# Patient Record
Sex: Male | Born: 1950 | Race: White | Hispanic: No | Marital: Married | Smoking: Never smoker
Health system: Southern US, Community
[De-identification: ages and names within clinical notes are randomized; demographics above are authoritative.]

## PROBLEM LIST (undated history)

## (undated) DIAGNOSIS — D649 Anemia, unspecified: Secondary | ICD-10-CM

## (undated) DIAGNOSIS — J189 Pneumonia, unspecified organism: Secondary | ICD-10-CM

## (undated) DIAGNOSIS — C9000 Multiple myeloma not having achieved remission: Secondary | ICD-10-CM

## (undated) DIAGNOSIS — Z8719 Personal history of other diseases of the digestive system: Secondary | ICD-10-CM

## (undated) HISTORY — PX: HERNIA REPAIR: SHX51

---

## 2009-02-17 ENCOUNTER — Ambulatory Visit (HOSPITAL_COMMUNITY): Admission: RE | Admit: 2009-02-17 | Discharge: 2009-02-17 | Payer: Self-pay | Admitting: Surgery

## 2011-01-23 NOTE — Op Note (Signed)
NAME:  Colin Calderon, Colin Calderon             ACCOUNT NO.:  [id number]   MEDICAL RECORD NO.:  [id number]          PATIENT TYPE:  AMB   LOCATION:  DAY                          FACILITY:  [facility]   PHYSICIAN:  Sandria Bales. Ezzard Standing, M.D.  DATE OF BIRTH:  18-Apr-1951   DATE OF PROCEDURE:  02/17/2009  DATE OF DISCHARGE:                               OPERATIVE REPORT   Date of Surgery - 17 February 2009   PREOPERATIVE DIAGNOSIS:  Left inguinal hernia.   POSTOPERATIVE DIAGNOSIS:  Indirect left inguinal hernia medium in size.   PROCEDURE:  Is a laparoscopic repair of left inguinal hernia.   SURGEON:  Dr. Ezzard Standing   FIRST ASSISTANT:  None.   ANESTHESIA:  Is general endotracheal.   BLOOD LOSS:  Is minimal.   INDICATIONS FOR PROCEDURE:  Colin Calderon is a 60 year old white male  patient of Dr. Lajean Manes who sees Dr. Griffith Citron from a GI  standpoint, has had increasing left inguinal hernia and comes for repair  of this hernia.   Discussed with him the options for open versus laparoscopic repair.  He  wanted to do laparoscopic repair.  I discussed potential risks which  include, but are not limited to, bleeding, infection, nerve injury and  recurrence of hernia.   OPERATIVE NOTE:  The patient placed in supine position.  A Foley  catheter in place.  His right arm was tucked and left arm was out to his  side.  He had PAS stockings in place and abdomen was prepped and  sterilely draped, given a gram of Ancef and timeout was held identifying  the patient and the procedure.  I had marked his left side  preoperatively.   I made an infraumbilical incision with sharp dissection carried down to  the rectus abdominis fascia.  I went to the anterior fascia, retracted  the rectus abdominal muscle anteriorly and then went behind the rectus  abdominal muscle got to the preperitoneal space and used the  preperitoneal balloon to dilate this preperitoneal space.   This was done for the visualization.  The balloon  was then removed.  The  12 mm Hassan trocar was then placed and I placed a 5 mm trocar in the  right lower quadrant, a 5 mm trocar in the left lower quadrant and  dissected out identifying the pubic tubercle, Cooper's ligament.  There  was really some weakness in the inguinal floor, but no obvious hernia.  I went down and dissected out laterally and I found the hernia sac which  was probably about 8-10 cm long.  I stripped this out of the cord  structures.  I then ligated the sac with a 0-0 PDS suture.   I then identified the cord structures which I skeletonized.  I used the  precut Atrium mesh and the C-QUR Lite mesh and placed this in the  preperitoneal space.  I tacked it with approximately 11 tacks, tacked it  medially to the pubic tubercle, inferiorly to Cooper's ligament,  superiorly to transverse fascia.  I avoided the area lateral to the  iliac vessels and inferior to the  ilioinguinal ligament and wrapped the  mesh around which I tacked back to Cooper's ligament.   The mesh lay flat.  I did take photos which I left in the chart of the  mesh.   He did have a little bleeder that was right off of Cooper's ligament and  I first tried to stop this with pressure and then I cauterized this and  that seemed to stop the bleeding.  I decompressed the preperitoneal  space, waited 3 minutes and relooked at it.  There is no further  bleeding.   I then removed my trocars.  The mesh again lay flat without any evidence  of any curling up on any edge.  I closed the anterior rectus fascia with  interrupted 0 Vicryl suture, I closed the skin with a 5-0 Vicryl suture,  painted the wounds with tincture Benzoin and steri-stripped them.   Sponge and needle count were correct at the end of the case.  The  patient tolerated the procedure and will be discharged home tonight.      Sandria Bales. Ezzard Standing, M.D.  Electronically Signed     DHN/MEDQ  D:  02/17/2009  T:  02/18/2009  Job:  [id number]   cc:    Griffith Citron, M.D.  Fax: [phone]   Oley Balm. Georgina Pillion, M.D.  Fax: [phone]

## 2012-01-17 ENCOUNTER — Telehealth: Payer: Self-pay | Admitting: Internal Medicine

## 2012-01-18 NOTE — Telephone Encounter (Signed)
Erroneous encounter. Have Not seen this patient

## 2014-02-23 ENCOUNTER — Ambulatory Visit (INDEPENDENT_AMBULATORY_CARE_PROVIDER_SITE_OTHER): Payer: BC Managed Care – PPO | Admitting: Sports Medicine

## 2014-02-23 ENCOUNTER — Encounter: Payer: Self-pay | Admitting: Sports Medicine

## 2014-02-23 VITALS — BP 106/67 | Ht 71.0 in | Wt 180.0 lb

## 2014-02-23 DIAGNOSIS — M25579 Pain in unspecified ankle and joints of unspecified foot: Secondary | ICD-10-CM

## 2014-02-23 DIAGNOSIS — M25571 Pain in right ankle and joints of right foot: Secondary | ICD-10-CM | POA: Insufficient documentation

## 2014-02-23 DIAGNOSIS — M25572 Pain in left ankle and joints of left foot: Principal | ICD-10-CM

## 2014-02-23 NOTE — Assessment & Plan Note (Addendum)
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 Lateral ankle capsule is getting pinched with sinus tarsi. Morning ankle stiffness a sign of effusion. - Provided patient with isnerts - Exercises prescribed (pigeon-toed heel raise, walk, and walk up steps). - Return in 1 month prn no improvement or worsening.

## 2014-02-23 NOTE — Patient Instructions (Signed)
For your ankle pain, this is likely due to malpositioning of the ankle where it is pronating and the lateral part is collapsing some. 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  - We are making inserts for your shoe to correct your arch.  - Do exercises:  ---Pigeon-toed heel raise on step. 3 sets of 15 ---Walk pigeon-toed across a room 10 times ---Walk up the steps 10 times pigeon-toed.  - Continue to bike.  Follow up in 1 month if you are not better.

## 2014-02-23 NOTE — Progress Notes (Signed)
Subjective:   CC: Patient presents today to establish care. Additionally, patient would like to discuss ankle pain.  HPI:   Ankle pain Bilateral, present for about 3 weeks. He had helped a friend lifting heavy objects about 4 weeks ago and felt like he injured his left hip and was walking oddly afterwards. Pain is noticeable but not severe. It is worse in morning when he stands after waking up. Denies stiffness, swelling, or instability. Symptoms better later in the day. He is active, walking and biking. Walking is okay, but he has the lateral ankle pain when he pushes off forefoot. He has had chronic foot pain and has never worn insoles. No h/o ankle issues.  Review of Systems - Per HPI.  PMH: No h/o ankle issues  SH: Walks and bikes for physical activity     Objective:  Physical Exam BP 106/67  Ht 5\' 11"  (1.803 m)  Wt 180 lb (81.647 kg)  BMI 25.12 kg/m2 GEN: NAD  EXTR:   Feet: No erythema, or swelling No tenderness  Normal flexibility and no laxity to anterior drawer sign. 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 male here to establish care and discuss bilateral ankle pain.    Plan:     Lateral ankle pain, bilateral Likely related to longitudinal arch breakdown causing ankle pronation and structural malposition with collapse of spring ligament but functioning posterior tibialis. Lateral ankle capsule is getting pinched with sinus tarsi. Morning ankle stiffness a sign of effusion. - Provided patient with isnerts - Exercises prescribed (pigeon-toed heel raise, walk,  and walk up steps). - Return in 1 month prn no improvement or worsening.    Hilton Sinclair, MD [place]

## 2014-03-08 ENCOUNTER — Emergency Department (HOSPITAL_COMMUNITY): Payer: BC Managed Care – PPO

## 2014-03-08 ENCOUNTER — Emergency Department (HOSPITAL_COMMUNITY)
Admission: EM | Admit: 2014-03-08 | Discharge: 2014-03-08 | Disposition: A | Discharge status: Home / Self Care | Payer: BC Managed Care – PPO | Attending: Emergency Medicine | Admitting: Emergency Medicine

## 2014-03-08 ENCOUNTER — Encounter (HOSPITAL_COMMUNITY): Payer: Self-pay | Admitting: Emergency Medicine

## 2014-03-08 DIAGNOSIS — Z792 Long term (current) use of antibiotics: Secondary | ICD-10-CM | POA: Insufficient documentation

## 2014-03-08 DIAGNOSIS — M545 Low back pain, unspecified: Secondary | ICD-10-CM | POA: Insufficient documentation

## 2014-03-08 DIAGNOSIS — R000 Tachycardia, unspecified: Secondary | ICD-10-CM | POA: Insufficient documentation

## 2014-03-08 DIAGNOSIS — R509 Fever, unspecified: Secondary | ICD-10-CM

## 2014-03-08 DIAGNOSIS — J159 Unspecified bacterial pneumonia: Secondary | ICD-10-CM | POA: Insufficient documentation

## 2014-03-08 DIAGNOSIS — R51 Headache: Secondary | ICD-10-CM | POA: Insufficient documentation

## 2014-03-08 DIAGNOSIS — J189 Pneumonia, unspecified organism: Secondary | ICD-10-CM

## 2014-03-08 LAB — BASIC METABOLIC PANEL
BUN: 17 mg/dL (ref 6–23)
CHLORIDE: 97 meq/L (ref 96–112)
CO2: 25 mEq/L (ref 19–32)
Calcium: 8.6 mg/dL (ref 8.4–10.5)
Creatinine, Ser: 1.21 mg/dL (ref 0.50–1.35)
GFR calc non Af Amer: 63 mL/min — ABNORMAL LOW (ref >90)
GFR, EST AFRICAN AMERICAN: 73 mL/min — AB (ref >90)
Glucose, Bld: 147 mg/dL — ABNORMAL HIGH (ref 70–99)
POTASSIUM: 4 meq/L (ref 3.7–5.3)
SODIUM: 135 meq/L — AB (ref 137–147)

## 2014-03-08 LAB — CBC WITH DIFFERENTIAL/PLATELET
BASOS ABS: 0 10*3/uL (ref 0.0–0.1)
BASOS PCT: 0 % (ref 0–1)
Eosinophils Absolute: 0 10*3/uL (ref 0.0–0.7)
Eosinophils Relative: 0 % (ref 0–5)
HCT: 36.4 % — ABNORMAL LOW (ref 39.0–52.0)
Hemoglobin: 12.9 g/dL — ABNORMAL LOW (ref 13.0–17.0)
LYMPHS PCT: 11 % — AB (ref 12–46)
Lymphs Abs: 0.6 10*3/uL — ABNORMAL LOW (ref 0.7–4.0)
MCH: 34.2 pg — ABNORMAL HIGH (ref 26.0–34.0)
MCHC: 35.4 g/dL (ref 30.0–36.0)
MCV: 96.6 fL (ref 78.0–100.0)
Monocytes Absolute: 0.1 10*3/uL (ref 0.1–1.0)
Monocytes Relative: 3 % (ref 3–12)
NEUTROS ABS: 4.7 10*3/uL (ref 1.7–7.7)
NEUTROS PCT: 86 % — AB (ref 43–77)
Platelets: 162 10*3/uL (ref 150–400)
RBC: 3.77 MIL/uL — ABNORMAL LOW (ref 4.22–5.81)
RDW: 12.8 % (ref 11.5–15.5)
WBC: 5.5 10*3/uL (ref 4.0–10.5)

## 2014-03-08 MED ORDER — SODIUM CHLORIDE 0.9 % IV BOLUS (SEPSIS)
1000.0000 mL | Freq: Once | INTRAVENOUS | Status: AC
  Administered 2014-03-08: 1000 mL via INTRAVENOUS

## 2014-03-08 MED ORDER — AZITHROMYCIN 250 MG PO TABS: 250.0000 mg | ORAL_TABLET | Freq: Every day | ORAL | Status: DC

## 2014-03-08 MED ORDER — IBUPROFEN 200 MG PO TABS
600.0000 mg | ORAL_TABLET | Freq: Once | ORAL | Status: AC
  Administered 2014-03-08: 600 mg via ORAL
  Filled 2014-03-08: qty 3

## 2014-03-08 NOTE — Discharge Instructions (Signed)
Fever, Adult A fever is a higher than normal body temperature. In an adult, an oral temperature around 98.6 F (37 C) is considered normal. A temperature of 100.4 F (38 C) or higher is generally considered a fever. Mild or moderate fevers generally have no long-term effects and often do not require treatment. Extreme fever (greater than or equal to 106 F or 41.1 C) can cause seizures. The sweating that may occur with repeated or prolonged fever may cause dehydration. Elderly people can develop confusion during a fever. A measured temperature can vary with:  Age.  Time of day.  Method of measurement (mouth, underarm, rectal, or ear). The fever is confirmed by taking a temperature with a thermometer. Temperatures can be taken different ways. Some methods are accurate and some are not.  An oral temperature is used most commonly. Electronic thermometers are fast and accurate.  An ear temperature will only be accurate if the thermometer is positioned as recommended by the manufacturer.  A rectal temperature is accurate and done for those adults who have a condition where an oral temperature cannot be taken.  An underarm (axillary) temperature is not accurate and not recommended. Fever is a symptom, not a disease.  CAUSES   Infections commonly cause fever.  Some noninfectious causes for fever include:  Some arthritis conditions.  Some thyroid or adrenal gland conditions.  Some immune system conditions.  Some types of cancer.  A medicine reaction.  High doses of certain street drugs such as methamphetamine.  Dehydration.  Exposure to high outside or room temperatures.  Occasionally, the source of a fever cannot be determined. This is sometimes called a "fever of unknown origin" (FUO).  Some situations may lead to a temporary rise in body temperature that may go away on its own. Examples are:  Childbirth.  Surgery.  Intense exercise. HOME CARE INSTRUCTIONS   Take  appropriate medicines for fever. Follow dosing instructions carefully. If you use acetaminophen to reduce the fever, be careful to avoid taking other medicines that also contain acetaminophen. Do not take aspirin for a fever if you are younger than age 60. There is an association with Reye's syndrome. Reye's syndrome is a rare but potentially deadly disease.  If an infection is present and antibiotics have been prescribed, take them as directed. Finish them even if you start to feel better.  Rest as needed.  Maintain an adequate fluid intake. To prevent dehydration during an illness with prolonged or recurrent fever, you may need to drink extra fluid.Drink enough fluids to keep your urine clear or pale yellow.  Sponging or bathing with room temperature water may help reduce body temperature. Do not use ice water or alcohol sponge baths.  Dress comfortably, but do not over-bundle. SEEK MEDICAL CARE IF:   You are unable to keep fluids down.  You develop vomiting or diarrhea.  You are not feeling at least partly better after 3 days.  You develop new symptoms or problems. SEEK IMMEDIATE MEDICAL CARE IF:   You have shortness of breath or trouble breathing.  You develop excessive weakness.  You are dizzy or you faint.  You are extremely thirsty or you are making little or no urine.  You develop new pain that was not there before (such as in the head, neck, chest, back, or abdomen).  You have persistant vomiting and diarrhea for more than 1 to 2 days.  You develop a stiff neck or your eyes become sensitive to light.  You develop a  skin rash.  You have a fever or persistent symptoms for more than 2 to 3 days.  You have a fever and your symptoms suddenly get worse. MAKE SURE YOU:   Understand these instructions.  Will watch your condition.  Will get help right away if you are not doing well or get worse. Document Released: 02/20/2001 Document Revised: 11/19/2011 Document  Reviewed: 06/28/2011 [facility] Patient Information 2015 [place], [place]. This information is not intended to replace advice given to you by your health care provider. Make sure you discuss any questions you have with your health care provider.  Pneumonia, Adult Pneumonia is an infection of the lungs.  CAUSES Pneumonia may be caused by bacteria or a virus. Usually, these infections are caused by breathing infectious particles into the lungs (respiratory tract). SYMPTOMS   Cough.  Fever.  Chest pain.  Increased rate of breathing.  Wheezing.  Mucus production. DIAGNOSIS  If you have the common symptoms of pneumonia, your caregiver will typically confirm the diagnosis with a chest X-ray. The X-ray will show an abnormality in the lung (pulmonary infiltrate) if you have pneumonia. Other tests of your blood, urine, or sputum may be done to find the specific cause of your pneumonia. Your caregiver may also do tests (blood gases or pulse oximetry) to see how well your lungs are working. TREATMENT  Some forms of pneumonia may be spread to other people when you cough or sneeze. You may be asked to wear a mask before and during your exam. Pneumonia that is caused by bacteria is treated with antibiotic medicine. Pneumonia that is caused by the influenza virus may be treated with an antiviral medicine. Most other viral infections must run their course. These infections will not respond to antibiotics.  PREVENTION A pneumococcal shot (vaccine) is available to prevent a common bacterial cause of pneumonia. This is usually suggested for:  People over 58 years old.  Patients on chemotherapy.  People with chronic lung problems, such as bronchitis or emphysema.  People with immune system problems. If you are over 65 or have a high risk condition, you may receive the pneumococcal vaccine if you have not received it before. In some countries, a routine influenza vaccine is also recommended. This vaccine  can help prevent some cases of pneumonia.You may be offered the influenza vaccine as part of your care. If you smoke, it is time to quit. You may receive instructions on how to stop smoking. Your caregiver can provide medicines and counseling to help you quit. HOME CARE INSTRUCTIONS   Cough suppressants may be used if you are losing too much rest. However, coughing protects you by clearing your lungs. You should avoid using cough suppressants if you can.  Your caregiver may have prescribed medicine if he or she thinks your pneumonia is caused by a bacteria or influenza. Finish your medicine even if you start to feel better.  Your caregiver may also prescribe an expectorant. This loosens the mucus to be coughed up.  Only take over-the-counter or prescription medicines for pain, discomfort, or fever as directed by your caregiver.  Do not smoke. Smoking is a common cause of bronchitis and can contribute to pneumonia. If you are a smoker and continue to smoke, your cough may last several weeks after your pneumonia has cleared.  A cold steam vaporizer or humidifier in your room or home may help loosen mucus.  Coughing is often worse at night. Sleeping in a semi-upright position in a recliner or using a couple  pillows under your head will help with this.  Get rest as you feel it is needed. Your body will usually let you know when you need to rest. SEEK IMMEDIATE MEDICAL CARE IF:   Your illness becomes worse. This is especially true if you are elderly or weakened from any other disease.  You cannot control your cough with suppressants and are losing sleep.  You begin coughing up blood.  You develop pain which is getting worse or is uncontrolled with medicines.  You have a fever.  Any of the symptoms which initially brought you in for treatment are getting worse rather than better.  You develop shortness of breath or chest pain. MAKE SURE YOU:   Understand these instructions.  Will  watch your condition.  Will get help right away if you are not doing well or get worse. Document Released: 08/27/2005 Document Revised: 11/19/2011 Document Reviewed: 11/16/2010 [facility] Patient Information 2015 [place], [place]. This information is not intended to replace advice given to you by your health care provider. Make sure you discuss any questions you have with your health care provider.

## 2014-03-08 NOTE — ED Provider Notes (Signed)
CSN: [id number]     Arrival date & time 03/08/14  1234 History   First MD Initiated Contact with Patient 03/08/14 1556     Chief Complaint  Patient presents with  . Fever  . Fatigue     (Consider location/radiation/quality/duration/timing/severity/associated sxs/prior Treatment) Patient is a 63 y.o. male presenting with general illness. The history is provided by the patient and the spouse.  Illness Severity:  Moderate Onset quality:  Gradual Timing:  Constant Progression:  Unchanged Chronicity:  New Associated symptoms: fever and rash (mild redness to chest yesterday. resolved)   Associated symptoms: no abdominal pain, no chest pain, no congestion, no cough, no diarrhea, no headaches, no nausea, no rhinorrhea, no shortness of breath, no sore throat and no vomiting     63 yo male pw Fever. 3 days. Intermittently improved from ~103 to 101 with tylenol/motrin. Minimal occasional frontal headache similar to what he has had with prior fevers. No ST, ear pain, congestion, rhinorrhea, sob, cough, abd pain, n/v. No neck pain, except for mild neck soreness had earlier after sleeping outside in chair. No neck stiffness.   History reviewed. No pertinent past medical history. Past Surgical History  Procedure Laterality Date  . Hernia repair     History reviewed. No pertinent family history. History  Substance Use Topics  . Smoking status: Never Smoker   . Smokeless tobacco: Not on file  . Alcohol Use: Yes    Review of Systems  Constitutional: Positive for fever and chills.  HENT: Negative for congestion, rhinorrhea, sore throat, trouble swallowing and voice change.   Eyes: Negative for pain and visual disturbance.  Respiratory: Negative for cough and shortness of breath.   Cardiovascular: Negative for chest pain and leg swelling.  Gastrointestinal: Negative for nausea, vomiting, abdominal pain and diarrhea.  Genitourinary: Negative for flank pain and difficulty urinating.   Musculoskeletal: Positive for back pain (minimal generalized low back pain credited to laying in bed. no midline back pain. ).  Skin: Positive for rash (mild redness to chest yesterday. resolved). Negative for color change.  Neurological: Negative for dizziness and headaches.  All other systems reviewed and are negative.     Allergies  Review of patient's allergies indicates no known allergies.  Home Medications   Prior to Admission medications   Medication Sig Start Date End Date Taking? Authorizing Provider  azithromycin (ZITHROMAX) 250 MG tablet Take 1 tablet (250 mg total) by mouth daily. Take first 2 tablets together, then 1 every day until finished. 03/08/14   Bonnita Hollow, MD   BP 112/68  Pulse 106  Temp(Src) 103.1 F (39.5 C) (Oral)  Resp 22  SpO2 95% Physical Exam  Nursing note and vitals reviewed. Constitutional: He is oriented to person, place, and time. He appears well-developed and well-nourished. No distress.  Sitting up in bed. NAD. Speaks in full sentences.  HENT:  Head: Normocephalic and atraumatic.  Eyes: Conjunctivae are normal. Right eye exhibits no discharge. Left eye exhibits no discharge.  Neck: No tracheal deviation present.  Cardiovascular: Regular rhythm, normal heart sounds and intact distal pulses.   Tachycardic to low 100's  Pulmonary/Chest: Effort normal and breath sounds normal. No stridor. No respiratory distress. He has no wheezes. He has no rales.  Abdominal: Soft. He exhibits no distension. There is no tenderness. There is no guarding.  Musculoskeletal: He exhibits no edema and no tenderness.  Neurological: He is alert and oriented to person, place, and time.  Skin: Skin is warm and dry.  Psychiatric:  He has a normal mood and affect. His behavior is normal.    ED Course  Procedures (including critical care time) Labs Review Labs Reviewed  CBC WITH DIFFERENTIAL - Abnormal; Notable for the following:    RBC 3.77 (*)    Hemoglobin 12.9  (*)    HCT 36.4 (*)    MCH 34.2 (*)    Neutrophils Relative % 86 (*)    Lymphocytes Relative 11 (*)    Lymphs Abs 0.6 (*)    All other components within normal limits  BASIC METABOLIC PANEL - Abnormal; Notable for the following:    Sodium 135 (*)    Glucose, Bld 147 (*)    GFR calc non Af Amer 63 (*)    GFR calc Af Amer 73 (*)    All other components within normal limits  CULTURE, BLOOD (ROUTINE X 2)  CULTURE, BLOOD (ROUTINE X 2)    Imaging Review Dg Chest 2 View  03/08/2014   CLINICAL DATA:  High fever.  EXAM: CHEST  2 VIEW  COMPARISON:  None  FINDINGS: Normal heart size. No pleural effusion or edema. The lungs are hyperinflated and there is flattening of the diaphragms. Right lung is clear. Retrocardiac opacity in the left base is noted which is suspicious for pneumonia.  IMPRESSION: 1. Suspect left base pneumonia. 2. Increase lung volumes suggestive of emphysema   Electronically Signed   By: Kerby Moors M.D.   On: 03/08/2014 18:25     EKG Interpretation None      MDM   Final diagnoses:  Fever, unspecified fever cause  CAP (community acquired pneumonia)    Fever. Essentially asymptomatic. Well appearing on exam.  Labs reassuring. CXR concerning for pna. Treat with z-pack.  Blood cultures pending. Will call with results.  Feels improved after IVF.   No tick exposure.  Without meningeal findings. Do not suspect meningitis. UA without evidence of UTI at outside facility.  No wounds.   Patient discharged home. Return precautions given. To follow up with pcp. Patient and spouse in agreement with plan.     Bonnita Hollow, MD 03/09/14 [phone]

## 2014-03-08 NOTE — ED Notes (Signed)
Pt reports having high fever 102-103 since Saturday, having generalized fatigue, bodyache and headache and sent here for dehydration. Denies abd pan, n/v/d or sore throat. No travel or tick bite. Last took tylenol at 0600 and 1130, temp is 103 at triage, mask on pt.

## 2014-03-09 NOTE — ED Provider Notes (Signed)
I saw and evaluated the patient, reviewed the resident's note and I agree with the findings and plan.   EKG Interpretation None      Pt is a healthy male with no medical hx comes in with cc of fever x 3 days. Pt has chills. Denies headache, neck pain, back pain, chest pan. Cough, uti like sxm joint pain. Pt has had a rash on the torso. Exam is benign. Will get cultures, basic labs, discharge if neg findings. No antibiotics for now.    Varney Biles, MD 03/09/14 [phone]

## 2014-03-14 LAB — CULTURE, BLOOD (ROUTINE X 2)
CULTURE: NO GROWTH
Culture: NO GROWTH

## 2017-08-15 ENCOUNTER — Ambulatory Visit: Payer: PPO | Admitting: Sports Medicine

## 2017-08-15 ENCOUNTER — Encounter: Payer: Self-pay | Admitting: Sports Medicine

## 2017-08-15 DIAGNOSIS — M25572 Pain in left ankle and joints of left foot: Secondary | ICD-10-CM | POA: Diagnosis not present

## 2017-08-15 NOTE — Progress Notes (Signed)
Subjective:    Patient ID: Colin Calderon, male    DOB: 05/08/1951, 66 y.o.   MRN: [id number]  Colin Calderon presents today complaining of acute L ankle pain. He was previously seen in 2015 with Dr. Oneida Alar for bilateral ankle pain and treated with green inserts with scaffold padding and that resolved his pain. He is an avid walker and walks 5 miles every morning. However, around 6 months ago he was lifting a heavy suitcase and injured his left groin. He had a hernia surgery about 10 years ago and knows that he does not have a hernia now. The pain in his groin has persisted and is slowly getting better. However, as a result he thinks he may be changing his gait and that is what is causing his left ankle/foot pain. He denies any foot trauma or falls. He has not noticed any foot or ankle swelling. The pain is located on the top of the midfoot medially and up the anterior ankle, no radiation. It feels like a "pressure like pain." There is no pain at rest but mostly with walking and moving. He has not worn his insoles for over a year as he got new shoes and they don't fit in the shoes anymore.      Review of Systems  Constitutional: Negative.  Negative for fever.  HENT: Negative.   Eyes: Negative.   Respiratory: Negative.   Cardiovascular: Negative.   Gastrointestinal: Negative.   Endocrine: Negative.   Genitourinary: Negative.   Musculoskeletal: Positive for arthralgias (left ankle/foot). Negative for gait problem and joint swelling.  Skin: Negative.   Allergic/Immunologic: Negative.   Neurological: Negative.   Hematological: Negative.   Psychiatric/Behavioral: Negative.        Objective:   Physical Exam  Constitutional: He is oriented to person, place, and time. He appears well-developed and well-nourished. No distress.  HENT:  Head: Normocephalic and atraumatic.  Eyes: Conjunctivae and EOM are normal. Pupils are equal, round, and reactive to light.  Neck: Normal range of motion.  Neck supple. No tracheal deviation present.  Pulmonary/Chest: Effort normal. No stridor.  Neurological: He is alert and oriented to person, place, and time.  Skin: Skin is warm and dry. He is not diaphoretic.  Psychiatric: He has a normal mood and affect. His behavior is normal.   MSK:   Feet/Ankles: No swelling, minor bunions noted bilaterally L>R, second ray dominant. Neutral arches. No calluses or sores on the plantar surface of the L foot. L Tenderness to palpation over the midfoot Extensor hallicus longus tendon. L Ankle has full ROM in all directions. L Big toe has full ROM in all directions. L Plantar/Dorsiflexion 5/5, Big toe dorsiflexion and plantarflexion stregth 5/5 without pain. L Talar tilt/Anterior drawer test negative. Arches collapse with walking. Neutral gait noted with minimal pronation. B/L feet warm, perfused distally and sensation to light touch intact. Left Hip: No tenderness to palpation over the Iliac crest. Hip has full ROM in all directions without pain. Hip adduction/abduction/flexion 5/5 on the Left. Negative log roll test for pain on the Left.       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 We have simply replaced those for him today. I also gave him an arch strap to wear when walking. He may continue with activity as tolerated. I did discuss the possibility of custom orthotics at some  point down the road. We will set up a follow-up appointment in 4 weeks but the patient may cancel the appointment if his symptoms improve.

## 2017-08-21 DIAGNOSIS — Z23 Encounter for immunization: Secondary | ICD-10-CM | POA: Diagnosis not present

## 2017-08-21 DIAGNOSIS — D485 Neoplasm of uncertain behavior of skin: Secondary | ICD-10-CM | POA: Diagnosis not present

## 2017-08-22 ENCOUNTER — Other Ambulatory Visit: Payer: Self-pay | Admitting: Dermatology

## 2017-08-22 DIAGNOSIS — R2232 Localized swelling, mass and lump, left upper limb: Secondary | ICD-10-CM

## 2017-09-05 ENCOUNTER — Other Ambulatory Visit: Payer: Self-pay | Admitting: Dermatology

## 2017-09-05 DIAGNOSIS — R2232 Localized swelling, mass and lump, left upper limb: Secondary | ICD-10-CM

## 2017-09-13 ENCOUNTER — Other Ambulatory Visit: Payer: Self-pay | Admitting: Dermatology

## 2017-09-13 DIAGNOSIS — R2232 Localized swelling, mass and lump, left upper limb: Secondary | ICD-10-CM

## 2017-09-17 ENCOUNTER — Ambulatory Visit
Admission: RE | Admit: 2017-09-17 | Discharge: 2017-09-17 | Disposition: A | Discharge status: Home / Self Care | Payer: PPO | Source: Ambulatory Visit | Attending: Dermatology | Admitting: Dermatology

## 2017-09-17 DIAGNOSIS — R590 Localized enlarged lymph nodes: Secondary | ICD-10-CM | POA: Diagnosis not present

## 2017-09-17 DIAGNOSIS — R2232 Localized swelling, mass and lump, left upper limb: Secondary | ICD-10-CM

## 2017-09-30 ENCOUNTER — Ambulatory Visit: Payer: PPO | Admitting: Sports Medicine

## 2017-10-03 DIAGNOSIS — H5213 Myopia, bilateral: Secondary | ICD-10-CM | POA: Diagnosis not present

## 2017-10-03 DIAGNOSIS — H52223 Regular astigmatism, bilateral: Secondary | ICD-10-CM | POA: Diagnosis not present

## 2017-10-03 DIAGNOSIS — H524 Presbyopia: Secondary | ICD-10-CM | POA: Diagnosis not present

## 2017-10-16 DIAGNOSIS — D485 Neoplasm of uncertain behavior of skin: Secondary | ICD-10-CM | POA: Diagnosis not present

## 2017-10-16 DIAGNOSIS — D235 Other benign neoplasm of skin of trunk: Secondary | ICD-10-CM | POA: Diagnosis not present

## 2017-11-05 DIAGNOSIS — H9193 Unspecified hearing loss, bilateral: Secondary | ICD-10-CM | POA: Diagnosis not present

## 2017-11-05 DIAGNOSIS — H6120 Impacted cerumen, unspecified ear: Secondary | ICD-10-CM | POA: Diagnosis not present

## 2018-01-31 DIAGNOSIS — Z8349 Family history of other endocrine, nutritional and metabolic diseases: Secondary | ICD-10-CM | POA: Diagnosis not present

## 2018-01-31 DIAGNOSIS — R7301 Impaired fasting glucose: Secondary | ICD-10-CM | POA: Diagnosis not present

## 2018-01-31 DIAGNOSIS — D7589 Other specified diseases of blood and blood-forming organs: Secondary | ICD-10-CM | POA: Diagnosis not present

## 2018-01-31 DIAGNOSIS — E538 Deficiency of other specified B group vitamins: Secondary | ICD-10-CM | POA: Diagnosis not present

## 2018-01-31 DIAGNOSIS — N400 Enlarged prostate without lower urinary tract symptoms: Secondary | ICD-10-CM | POA: Diagnosis not present

## 2018-01-31 DIAGNOSIS — D649 Anemia, unspecified: Secondary | ICD-10-CM | POA: Diagnosis not present

## 2018-02-07 DIAGNOSIS — R0989 Other specified symptoms and signs involving the circulatory and respiratory systems: Secondary | ICD-10-CM | POA: Diagnosis not present

## 2018-02-07 DIAGNOSIS — Z1211 Encounter for screening for malignant neoplasm of colon: Secondary | ICD-10-CM | POA: Diagnosis not present

## 2018-02-07 DIAGNOSIS — Z0000 Encounter for general adult medical examination without abnormal findings: Secondary | ICD-10-CM | POA: Diagnosis not present

## 2018-02-07 DIAGNOSIS — D649 Anemia, unspecified: Secondary | ICD-10-CM | POA: Diagnosis not present

## 2018-02-07 DIAGNOSIS — Z1389 Encounter for screening for other disorder: Secondary | ICD-10-CM | POA: Diagnosis not present

## 2018-02-07 DIAGNOSIS — Z86010 Personal history of colonic polyps: Secondary | ICD-10-CM | POA: Diagnosis not present

## 2018-02-12 ENCOUNTER — Other Ambulatory Visit: Payer: Self-pay | Admitting: Family Medicine

## 2018-02-12 DIAGNOSIS — R0989 Other specified symptoms and signs involving the circulatory and respiratory systems: Secondary | ICD-10-CM

## 2018-02-17 DIAGNOSIS — R7989 Other specified abnormal findings of blood chemistry: Secondary | ICD-10-CM | POA: Diagnosis not present

## 2018-02-17 DIAGNOSIS — J069 Acute upper respiratory infection, unspecified: Secondary | ICD-10-CM | POA: Diagnosis not present

## 2018-02-18 ENCOUNTER — Ambulatory Visit
Admission: RE | Admit: 2018-02-18 | Discharge: 2018-02-18 | Disposition: A | Discharge status: Home / Self Care | Payer: PPO | Source: Ambulatory Visit | Attending: Family Medicine | Admitting: Family Medicine

## 2018-02-18 DIAGNOSIS — R0989 Other specified symptoms and signs involving the circulatory and respiratory systems: Secondary | ICD-10-CM

## 2018-02-18 DIAGNOSIS — I6523 Occlusion and stenosis of bilateral carotid arteries: Secondary | ICD-10-CM | POA: Diagnosis not present

## 2018-06-03 DIAGNOSIS — Z23 Encounter for immunization: Secondary | ICD-10-CM | POA: Diagnosis not present

## 2018-07-17 DIAGNOSIS — H0012 Chalazion right lower eyelid: Secondary | ICD-10-CM | POA: Diagnosis not present

## 2018-10-09 DIAGNOSIS — I739 Peripheral vascular disease, unspecified: Secondary | ICD-10-CM | POA: Diagnosis not present

## 2018-10-09 DIAGNOSIS — J069 Acute upper respiratory infection, unspecified: Secondary | ICD-10-CM | POA: Diagnosis not present

## 2018-10-09 DIAGNOSIS — Z23 Encounter for immunization: Secondary | ICD-10-CM | POA: Diagnosis not present

## 2019-02-10 DIAGNOSIS — Z8349 Family history of other endocrine, nutritional and metabolic diseases: Secondary | ICD-10-CM | POA: Diagnosis not present

## 2019-02-10 DIAGNOSIS — R7301 Impaired fasting glucose: Secondary | ICD-10-CM | POA: Diagnosis not present

## 2019-02-10 DIAGNOSIS — Z0000 Encounter for general adult medical examination without abnormal findings: Secondary | ICD-10-CM | POA: Diagnosis not present

## 2019-02-10 DIAGNOSIS — E538 Deficiency of other specified B group vitamins: Secondary | ICD-10-CM | POA: Diagnosis not present

## 2019-02-10 DIAGNOSIS — N400 Enlarged prostate without lower urinary tract symptoms: Secondary | ICD-10-CM | POA: Diagnosis not present

## 2019-02-10 DIAGNOSIS — D7589 Other specified diseases of blood and blood-forming organs: Secondary | ICD-10-CM | POA: Diagnosis not present

## 2019-02-12 IMAGING — US US AXILLARY LEFT
1 series · 13 of 23 positions shown · non-contrast
Comparison: None.

CLINICAL DATA: 66-year-old with enlarging palpable area in the left
axilla for 5 years.

EXAM:
ULTRASOUND LEFT UPPER EXTREMITY LIMITED
TECHNIQUE: Ultrasound examination of the upper extremity soft tissues was
performed in the area of clinical concern, concentrating on the left
axilla. This was not part of a dedicated mammographic evaluation.

[Series 1: us axillary left · 0.05mm/px · 23 acquisitions, 13 frames shown]
[im 1/23]
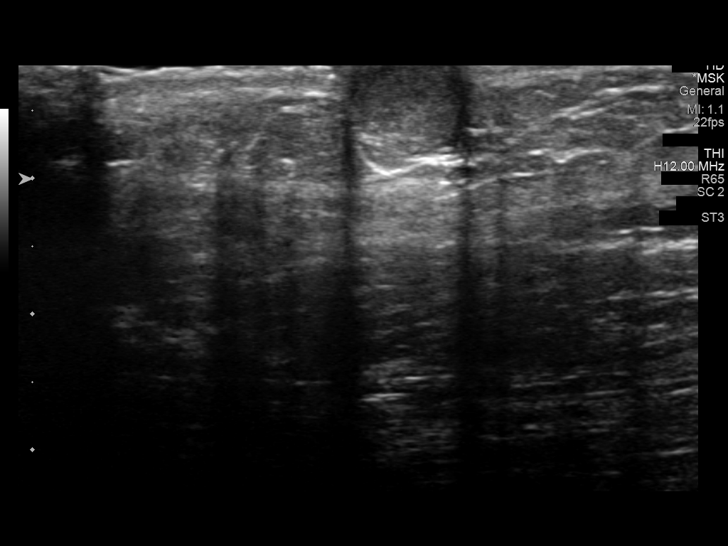
[im 3/23]
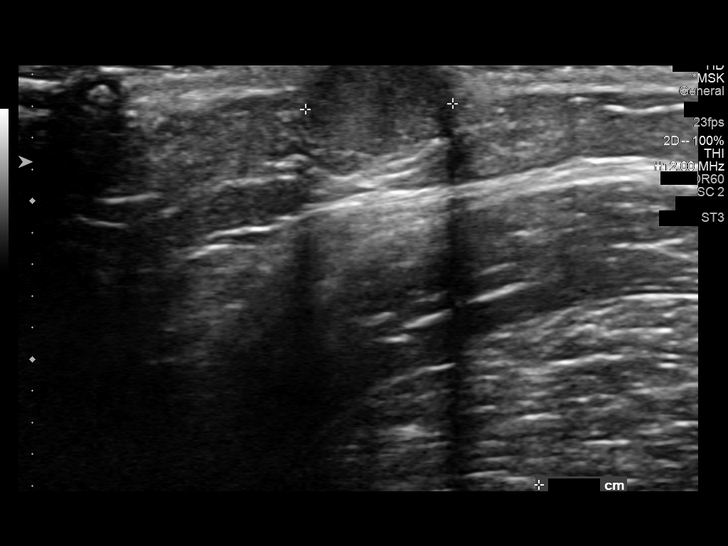
[im 5/23]
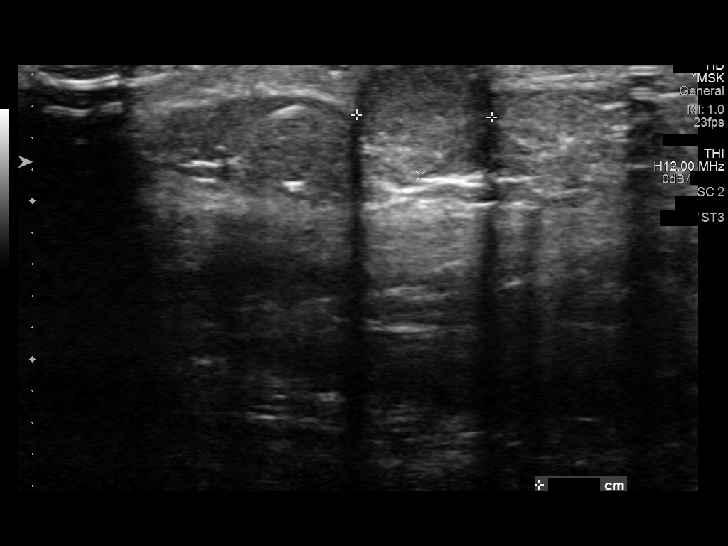
[im 7/23]
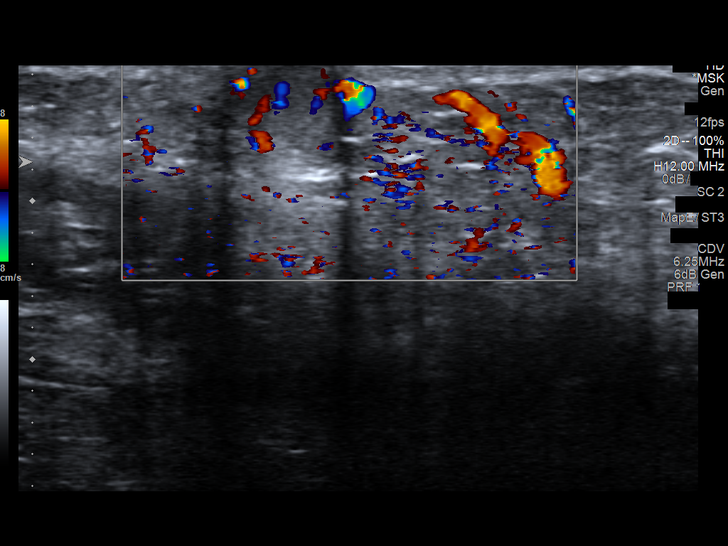
[im 8/23]
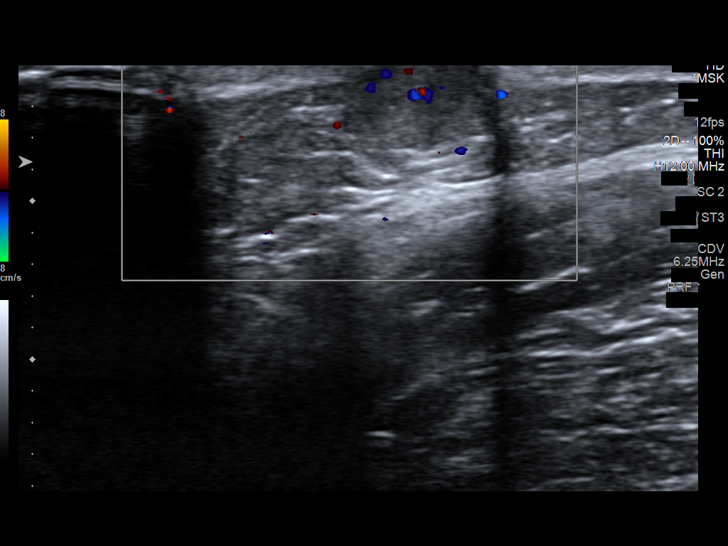
[im 10/23]
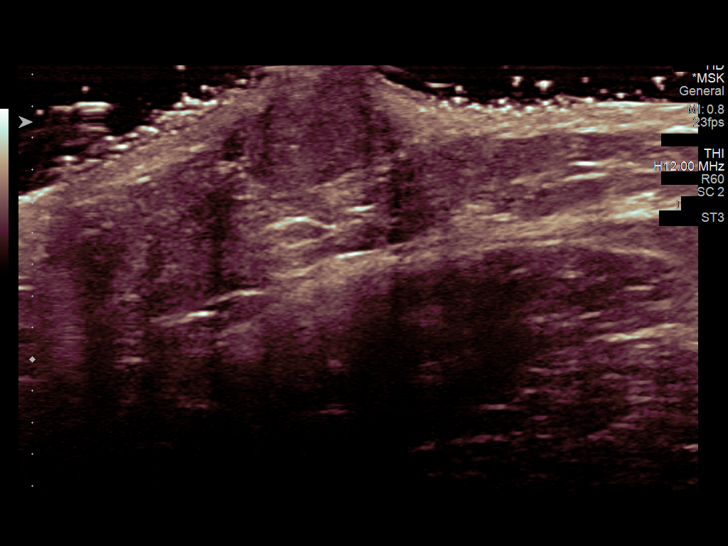
[im 12/23]
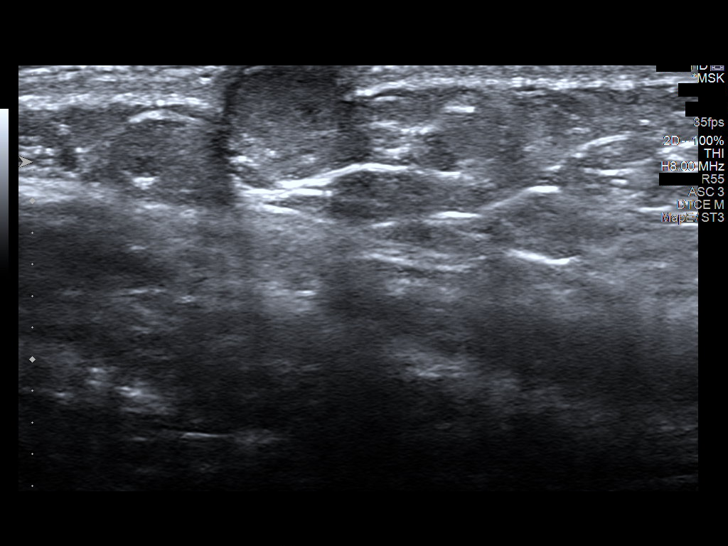
[im 14/23]
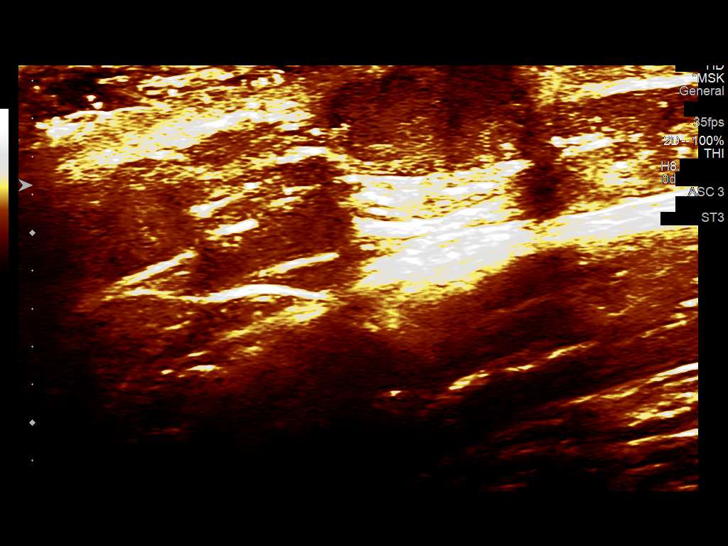
[im 16/23]
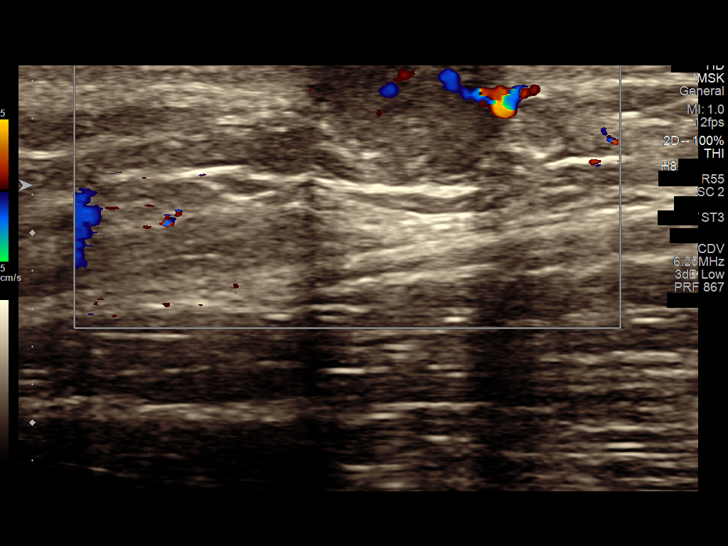
[im 17/23]
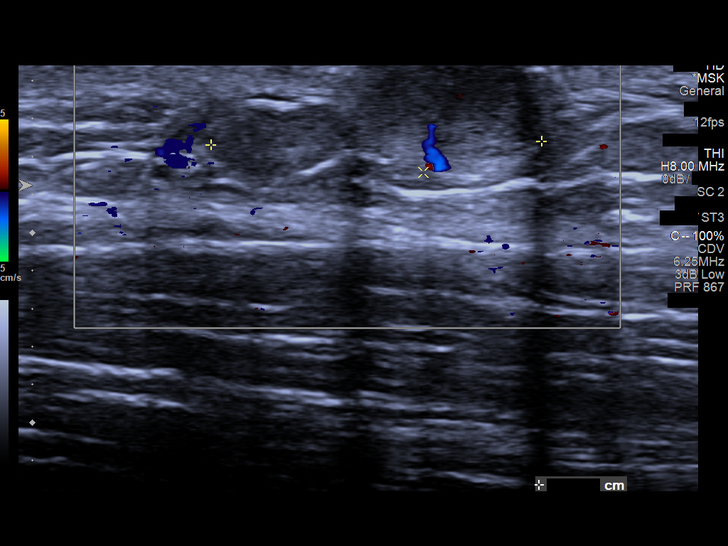
[im 19/23]
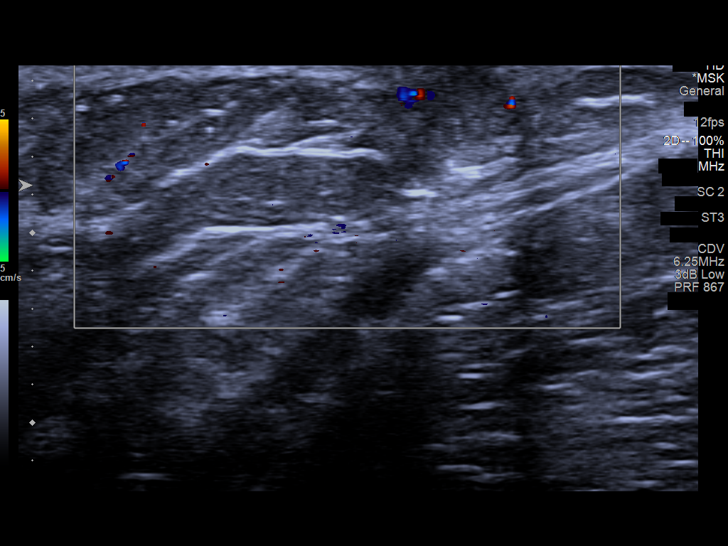
[im 21/23]
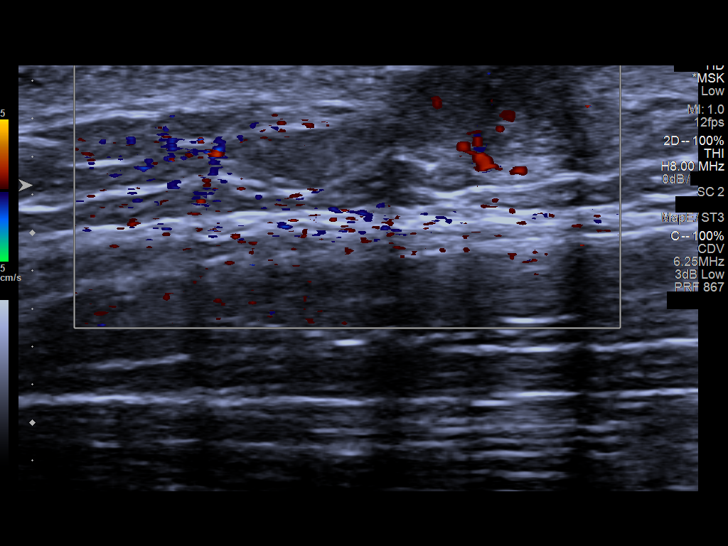
[im 23/23]
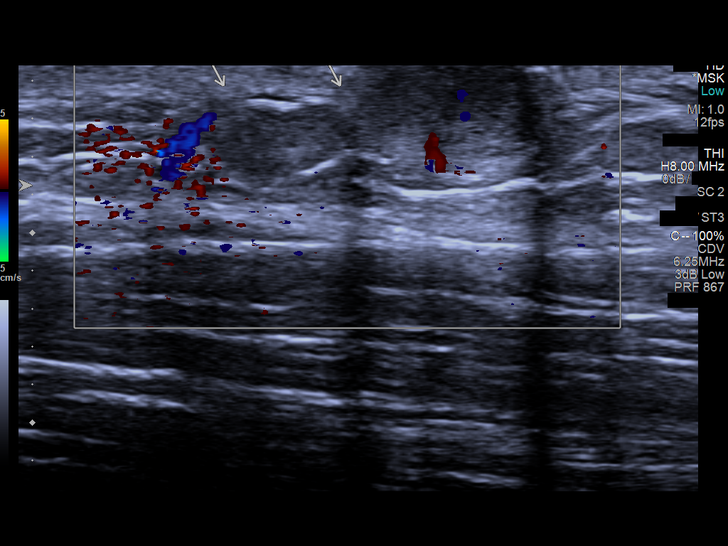

[13 of 23 positions shown; findings below may reference images not displayed]

FINDINGS: Interpretation of this examination was delayed as it was not made
available for dictation in PACS until 09/20/2017.

Corresponding with the patient's palpable concern in the left axilla
is a superficial, well-circumscribed lesion with internal blood
flow. This is lobulated and measures approximately 9 x 8 x 9 mm.
There may be a tail-like excrescence extending peripherally from
this lesion, increasing the overall dimension to 17 mm. This lesion
demonstrates low level internal echoes and acoustic enhancement. It
appears to deforms the overlying skin surface.
IMPRESSION: 1. Nonspecific left axillary lesion demonstrates internal blood flow
consistent with a solid lesion. Clinical history and ultrasound
appearance support a benign etiology.
2. Clinical follow up and correlation with breast physical
examination recommended. If enlarging, dedicated mammographic
evaluation recommended.

## 2019-02-13 DIAGNOSIS — R7301 Impaired fasting glucose: Secondary | ICD-10-CM | POA: Diagnosis not present

## 2019-02-13 DIAGNOSIS — N400 Enlarged prostate without lower urinary tract symptoms: Secondary | ICD-10-CM | POA: Diagnosis not present

## 2019-02-13 DIAGNOSIS — D7589 Other specified diseases of blood and blood-forming organs: Secondary | ICD-10-CM | POA: Diagnosis not present

## 2019-06-23 DIAGNOSIS — Z23 Encounter for immunization: Secondary | ICD-10-CM | POA: Diagnosis not present

## 2019-07-30 DIAGNOSIS — M545 Low back pain: Secondary | ICD-10-CM | POA: Diagnosis not present

## 2019-07-30 DIAGNOSIS — Z03818 Encounter for observation for suspected exposure to other biological agents ruled out: Secondary | ICD-10-CM | POA: Diagnosis not present

## 2019-10-10 ENCOUNTER — Ambulatory Visit: Payer: PPO

## 2019-10-15 ENCOUNTER — Ambulatory Visit: Payer: PPO

## 2019-10-16 DIAGNOSIS — M542 Cervicalgia: Secondary | ICD-10-CM | POA: Diagnosis not present

## 2019-10-16 DIAGNOSIS — M5432 Sciatica, left side: Secondary | ICD-10-CM | POA: Diagnosis not present

## 2019-10-16 DIAGNOSIS — M25512 Pain in left shoulder: Secondary | ICD-10-CM | POA: Diagnosis not present

## 2019-10-18 ENCOUNTER — Ambulatory Visit: Payer: PPO

## 2019-11-03 ENCOUNTER — Ambulatory Visit: Payer: PPO

## 2020-02-22 DIAGNOSIS — Z98818 Other dental procedure status: Secondary | ICD-10-CM | POA: Diagnosis not present

## 2020-03-03 DIAGNOSIS — H52203 Unspecified astigmatism, bilateral: Secondary | ICD-10-CM | POA: Diagnosis not present

## 2020-03-03 DIAGNOSIS — H25041 Posterior subcapsular polar age-related cataract, right eye: Secondary | ICD-10-CM | POA: Diagnosis not present

## 2020-03-03 DIAGNOSIS — H5213 Myopia, bilateral: Secondary | ICD-10-CM | POA: Diagnosis not present

## 2020-04-04 DIAGNOSIS — Z8349 Family history of other endocrine, nutritional and metabolic diseases: Secondary | ICD-10-CM | POA: Diagnosis not present

## 2020-04-04 DIAGNOSIS — E538 Deficiency of other specified B group vitamins: Secondary | ICD-10-CM | POA: Diagnosis not present

## 2020-04-04 DIAGNOSIS — Z125 Encounter for screening for malignant neoplasm of prostate: Secondary | ICD-10-CM | POA: Diagnosis not present

## 2020-04-04 DIAGNOSIS — Z136 Encounter for screening for cardiovascular disorders: Secondary | ICD-10-CM | POA: Diagnosis not present

## 2020-04-04 DIAGNOSIS — R7301 Impaired fasting glucose: Secondary | ICD-10-CM | POA: Diagnosis not present

## 2020-04-07 DIAGNOSIS — R0989 Other specified symptoms and signs involving the circulatory and respiratory systems: Secondary | ICD-10-CM | POA: Diagnosis not present

## 2020-04-07 DIAGNOSIS — E538 Deficiency of other specified B group vitamins: Secondary | ICD-10-CM | POA: Diagnosis not present

## 2020-04-07 DIAGNOSIS — H9193 Unspecified hearing loss, bilateral: Secondary | ICD-10-CM | POA: Diagnosis not present

## 2020-04-07 DIAGNOSIS — E878 Other disorders of electrolyte and fluid balance, not elsewhere classified: Secondary | ICD-10-CM | POA: Diagnosis not present

## 2020-04-07 DIAGNOSIS — Z86010 Personal history of colonic polyps: Secondary | ICD-10-CM | POA: Diagnosis not present

## 2020-04-07 DIAGNOSIS — Z8349 Family history of other endocrine, nutritional and metabolic diseases: Secondary | ICD-10-CM | POA: Diagnosis not present

## 2020-04-07 DIAGNOSIS — R7989 Other specified abnormal findings of blood chemistry: Secondary | ICD-10-CM | POA: Diagnosis not present

## 2020-04-07 DIAGNOSIS — Z0000 Encounter for general adult medical examination without abnormal findings: Secondary | ICD-10-CM | POA: Diagnosis not present

## 2020-04-07 DIAGNOSIS — D7589 Other specified diseases of blood and blood-forming organs: Secondary | ICD-10-CM | POA: Diagnosis not present

## 2020-04-07 DIAGNOSIS — R7301 Impaired fasting glucose: Secondary | ICD-10-CM | POA: Diagnosis not present

## 2020-05-02 DIAGNOSIS — Z1152 Encounter for screening for COVID-19: Secondary | ICD-10-CM | POA: Diagnosis not present

## 2020-05-02 DIAGNOSIS — R509 Fever, unspecified: Secondary | ICD-10-CM | POA: Diagnosis not present

## 2020-05-12 DIAGNOSIS — Z1152 Encounter for screening for COVID-19: Secondary | ICD-10-CM | POA: Diagnosis not present

## 2020-06-14 DIAGNOSIS — Z23 Encounter for immunization: Secondary | ICD-10-CM | POA: Diagnosis not present

## 2021-03-08 DIAGNOSIS — H5213 Myopia, bilateral: Secondary | ICD-10-CM | POA: Diagnosis not present

## 2021-03-08 DIAGNOSIS — H25041 Posterior subcapsular polar age-related cataract, right eye: Secondary | ICD-10-CM | POA: Diagnosis not present

## 2021-04-05 DIAGNOSIS — D649 Anemia, unspecified: Secondary | ICD-10-CM | POA: Diagnosis not present

## 2021-04-05 DIAGNOSIS — Z125 Encounter for screening for malignant neoplasm of prostate: Secondary | ICD-10-CM | POA: Diagnosis not present

## 2021-04-05 DIAGNOSIS — Z8349 Family history of other endocrine, nutritional and metabolic diseases: Secondary | ICD-10-CM | POA: Diagnosis not present

## 2021-04-05 DIAGNOSIS — E538 Deficiency of other specified B group vitamins: Secondary | ICD-10-CM | POA: Diagnosis not present

## 2021-04-05 DIAGNOSIS — R7301 Impaired fasting glucose: Secondary | ICD-10-CM | POA: Diagnosis not present

## 2021-04-05 DIAGNOSIS — D7589 Other specified diseases of blood and blood-forming organs: Secondary | ICD-10-CM | POA: Diagnosis not present

## 2021-04-10 DIAGNOSIS — Z1389 Encounter for screening for other disorder: Secondary | ICD-10-CM | POA: Diagnosis not present

## 2021-04-10 DIAGNOSIS — Z0000 Encounter for general adult medical examination without abnormal findings: Secondary | ICD-10-CM | POA: Diagnosis not present

## 2021-05-05 DIAGNOSIS — R509 Fever, unspecified: Secondary | ICD-10-CM | POA: Diagnosis not present

## 2021-05-05 DIAGNOSIS — R5383 Other fatigue: Secondary | ICD-10-CM | POA: Diagnosis not present

## 2021-05-05 DIAGNOSIS — Z7689 Persons encountering health services in other specified circumstances: Secondary | ICD-10-CM | POA: Diagnosis not present

## 2021-05-05 DIAGNOSIS — Z6823 Body mass index (BMI) 23.0-23.9, adult: Secondary | ICD-10-CM | POA: Diagnosis not present

## 2021-05-08 ENCOUNTER — Encounter (HOSPITAL_BASED_OUTPATIENT_CLINIC_OR_DEPARTMENT_OTHER): Payer: Self-pay | Admitting: Radiology

## 2021-05-08 ENCOUNTER — Emergency Department (HOSPITAL_BASED_OUTPATIENT_CLINIC_OR_DEPARTMENT_OTHER)
Admission: EM | Admit: 2021-05-08 | Discharge: 2021-05-08 | Disposition: A | Discharge status: Home / Self Care | Payer: PPO | Attending: Emergency Medicine | Admitting: Emergency Medicine

## 2021-05-08 ENCOUNTER — Emergency Department (HOSPITAL_BASED_OUTPATIENT_CLINIC_OR_DEPARTMENT_OTHER): Payer: PPO | Admitting: Radiology

## 2021-05-08 ENCOUNTER — Other Ambulatory Visit: Payer: Self-pay

## 2021-05-08 DIAGNOSIS — J181 Lobar pneumonia, unspecified organism: Secondary | ICD-10-CM | POA: Insufficient documentation

## 2021-05-08 DIAGNOSIS — J189 Pneumonia, unspecified organism: Secondary | ICD-10-CM

## 2021-05-08 DIAGNOSIS — Z20822 Contact with and (suspected) exposure to covid-19: Secondary | ICD-10-CM | POA: Diagnosis not present

## 2021-05-08 DIAGNOSIS — R509 Fever, unspecified: Secondary | ICD-10-CM | POA: Diagnosis not present

## 2021-05-08 LAB — COMPREHENSIVE METABOLIC PANEL
ALT: 13 U/L (ref 0–44)
AST: 14 U/L — ABNORMAL LOW (ref 15–41)
Albumin: 4.3 g/dL (ref 3.5–5.0)
Alkaline Phosphatase: 48 U/L (ref 38–126)
Anion gap: 12 (ref 5–15)
BUN: 24 mg/dL — ABNORMAL HIGH (ref 8–23)
CO2: 26 mmol/L (ref 22–32)
Calcium: 9.6 mg/dL (ref 8.9–10.3)
Chloride: 100 mmol/L (ref 98–111)
Creatinine, Ser: 1.41 mg/dL — ABNORMAL HIGH (ref 0.61–1.24)
GFR, Estimated: 54 mL/min — ABNORMAL LOW (ref >60)
Glucose, Bld: 96 mg/dL (ref 70–99)
Potassium: 3.9 mmol/L (ref 3.5–5.1)
Sodium: 138 mmol/L (ref 135–145)
Total Bilirubin: 0.3 mg/dL (ref 0.3–1.2)
Total Protein: 7.6 g/dL (ref 6.5–8.1)

## 2021-05-08 LAB — CBC
HCT: 34.2 % — ABNORMAL LOW (ref 39.0–52.0)
Hemoglobin: 11.5 g/dL — ABNORMAL LOW (ref 13.0–17.0)
MCH: 34 pg (ref 26.0–34.0)
MCHC: 33.6 g/dL (ref 30.0–36.0)
MCV: 101.2 fL — ABNORMAL HIGH (ref 80.0–100.0)
Platelets: 245 10*3/uL (ref 150–400)
RBC: 3.38 MIL/uL — ABNORMAL LOW (ref 4.22–5.81)
RDW: 13.3 % (ref 11.5–15.5)
WBC: 4 10*3/uL (ref 4.0–10.5)
nRBC: 0 % (ref 0.0–0.2)

## 2021-05-08 LAB — URINALYSIS, ROUTINE W REFLEX MICROSCOPIC
Bilirubin Urine: NEGATIVE
Glucose, UA: NEGATIVE mg/dL
Hgb urine dipstick: NEGATIVE
Ketones, ur: NEGATIVE mg/dL
Leukocytes,Ua: NEGATIVE
Nitrite: NEGATIVE
Protein, ur: 30 mg/dL — AB
Specific Gravity, Urine: 1.007 (ref 1.005–1.030)
pH: 5.5 (ref 5.0–8.0)

## 2021-05-08 LAB — RESP PANEL BY RT-PCR (FLU A&B, COVID) ARPGX2
Influenza A by PCR: NEGATIVE
Influenza B by PCR: NEGATIVE
SARS Coronavirus 2 by RT PCR: NEGATIVE

## 2021-05-08 LAB — LIPASE, BLOOD: Lipase: 32 U/L (ref 11–51)

## 2021-05-08 NOTE — ED Triage Notes (Signed)
Patient reports to the ER for fever. Patient reports he has had a fever for a week that is not being touched by Advil and Tylenol. Patient was placed on Doxycycline x4 days ago. Patient reports last night his fever went back up. Patient reports it was 102.6 and 9pm last night.

## 2021-05-08 NOTE — Discharge Instructions (Addendum)
Please finish the course of doxycycline that was prescribed by your primary care office.  Please contact your primary care office to request an appointment ideally to be seen on Wednesday for recheck.  If in the meantime you develop difficulty breathing, worsening fevers, or other new concerning symptom, come back to ER for reassessment.  The chest xray showed: "Right lower lobe pneumonia. Followup PA and lateral chest X-ray is recommended in 3-4 weeks following trial of antibiotic therapy to ensure resolution and exclude underlying malignancy."   Your creatinine was 1.4. Please discuss with your PCP. Drink plenty of fluids. You will need this repeated if this is different than your baseline levels.  Your covid test was negative.

## 2021-05-08 NOTE — ED Provider Notes (Signed)
Colin Calderon Provider Note   CSN: [id number] Arrival date & time: 05/08/21  1209     History Chief Complaint  Patient presents with   Fever    Colin Calderon is a 70 y.o. male.  Presented to ER with concern for fever.  Patient states that symptoms started last Wednesday.  Has been having intermittent fevers ever since.  Last fever was yesterday evening up to 102.6.  Contacted his primary care office but they would not see him without having a negative COVID test.  His primary care doctor sent in a course of doxycycline which she started taking on Saturday.  Has been alternating Tylenol and Advil for the fever.  He denies any cough or difficulty in breathing.  No abdominal pain or vomiting.  No burning with urination.  No headache or back pain or neck pain.  Denies any chronic medical problems.  Non-smoker.  HPI     History reviewed. No pertinent past medical history.  Patient Active Problem List   Diagnosis Date Noted   Bilateral ankle pain 02/23/2014    Past Surgical History:  Procedure Laterality Date   HERNIA REPAIR         No family history on file.  Social History   Tobacco Use   Smoking status: Never  Substance Use Topics   Alcohol use: Yes   Drug use: No    Home Medications Prior to Admission medications   Medication Sig Start Date End Date Taking? Authorizing Provider  azithromycin (ZITHROMAX) 250 MG tablet Take 1 tablet (250 mg total) by mouth daily. Take first 2 tablets together, then 1 every day until finished. Patient not taking: Reported on 08/15/2017 03/08/14   Colin Hollow, MD    Allergies    Patient has no known allergies.  Review of Systems   Review of Systems  Constitutional:  Positive for fever. Negative for chills and fatigue.  HENT:  Negative for ear pain and sore throat.   Eyes:  Negative for pain and visual disturbance.  Respiratory:  Negative for cough and shortness of breath.   Cardiovascular:   Negative for chest pain and palpitations.  Gastrointestinal:  Negative for abdominal pain and vomiting.  Genitourinary:  Negative for dysuria and hematuria.  Musculoskeletal:  Negative for arthralgias and back pain.  Skin:  Negative for color change and rash.  Neurological:  Negative for seizures and syncope.  All other systems reviewed and are negative.  Physical Exam Updated Vital Signs BP (!) 130/92   Pulse 69   Temp 97.8 F (36.6 C)   Resp 14   SpO2 99%   Physical Exam Vitals and nursing note reviewed.  Constitutional:      Appearance: He is well-developed.  HENT:     Head: Normocephalic and atraumatic.     Right Ear: Tympanic membrane, ear canal and external ear normal.     Left Ear: Tympanic membrane, ear canal and external ear normal.     Mouth/Throat:     Mouth: Mucous membranes are moist.     Pharynx: Oropharynx is clear. No oropharyngeal exudate or posterior oropharyngeal erythema.  Eyes:     Conjunctiva/sclera: Conjunctivae normal.  Cardiovascular:     Rate and Rhythm: Normal rate and regular rhythm.     Heart sounds: No murmur heard. Pulmonary:     Effort: Pulmonary effort is normal. No respiratory distress.     Breath sounds: Normal breath sounds.  Abdominal:     Palpations: Abdomen is soft.  Tenderness: There is no abdominal tenderness.  Musculoskeletal:        General: No deformity or signs of injury.     Cervical back: Neck supple.  Skin:    General: Skin is warm and dry.  Neurological:     General: No focal deficit present.     Mental Status: He is alert.  Psychiatric:        Mood and Affect: Mood normal.        Behavior: Behavior normal.    ED Results / Procedures / Treatments   Labs (all labs ordered are listed, but only abnormal results are displayed) Labs Reviewed  COMPREHENSIVE METABOLIC PANEL - Abnormal; Notable for the following components:      Result Value   BUN 24 (*)    Creatinine, Ser 1.41 (*)    AST 14 (*)    GFR, Estimated  54 (*)    All other components within normal limits  CBC - Abnormal; Notable for the following components:   RBC 3.38 (*)    Hemoglobin 11.5 (*)    HCT 34.2 (*)    MCV 101.2 (*)    All other components within normal limits  URINALYSIS, ROUTINE W REFLEX MICROSCOPIC - Abnormal; Notable for the following components:   Protein, ur 30 (*)    All other components within normal limits  RESP PANEL BY RT-PCR (FLU A&B, COVID) ARPGX2  LIPASE, BLOOD    EKG None  Radiology DG Chest 2 View  Result Date: 05/08/2021 CLINICAL DATA:  Fever EXAM: CHEST - 2 VIEW COMPARISON:  03/08/2014 FINDINGS: Normal heart size. No pleural effusion or edema. Airspace consolidation within the periphery of the right lower lobe is identified compatible with pneumonia. Visualized osseous structures are unremarkable. IMPRESSION: Right lower lobe pneumonia. Followup PA and lateral chest X-ray is recommended in 3-4 weeks following trial of antibiotic therapy to ensure resolution and exclude underlying malignancy. Electronically Signed   By: Kerby Moors M.D.   On: 05/08/2021 13:46    Procedures Procedures   Medications Ordered in ED Medications - No data to display  ED Course  I have reviewed the triage vital signs and the nursing notes.  Pertinent labs & imaging results that were available during my care of the patient were reviewed by me and considered in my medical decision making (see chart for details).    MDM Rules/Calculators/A&P                           70 year old male presents to ER with concern for fever.  Had reported daily fevers from Wednesday until Sunday.  No fever today.  No associated symptoms.  On exam well-appearing in no distress, afebrile today.  Chest x-ray concerning for right lower lobe pneumonia.  Suspect this is cause of patient's fever.  Already on doxycycline which would appropriately treat community-acquired pneumonia given he has no other medical comorbidities.  As patient is afebrile  today his labs are stable and he is in no respiratory distress, believe he is appropriate for discharge and outpatient management.  Recommend he complete the course of doxycycline that was prescribed by his primary doctor.  Discussed need for repeat chest x-ray to ensure resolution and exclude underlying malignancy.  Cr 1.4 but last Cr many years ago was 1.2 so suspect this may be near baseline. Reviewed return precautions with patient and wife and discharged home.    After the discussed management above, the patient was determined  to be safe for discharge.  The patient was in agreement with this plan and all questions regarding their care were answered.  ED return precautions were discussed and the patient will return to the ED with any significant worsening of condition.  Final Clinical Impression(s) / ED Diagnoses Final diagnoses:  Pneumonia of right lower lobe due to infectious organism    Rx / DC Orders ED Discharge Orders     None        Lucrezia Starch, MD 05/08/21 1517

## 2021-05-22 DIAGNOSIS — N400 Enlarged prostate without lower urinary tract symptoms: Secondary | ICD-10-CM | POA: Diagnosis not present

## 2021-05-22 DIAGNOSIS — D7589 Other specified diseases of blood and blood-forming organs: Secondary | ICD-10-CM | POA: Diagnosis not present

## 2021-05-22 DIAGNOSIS — E538 Deficiency of other specified B group vitamins: Secondary | ICD-10-CM | POA: Diagnosis not present

## 2021-05-22 DIAGNOSIS — Z23 Encounter for immunization: Secondary | ICD-10-CM | POA: Diagnosis not present

## 2021-05-22 DIAGNOSIS — Z8701 Personal history of pneumonia (recurrent): Secondary | ICD-10-CM | POA: Diagnosis not present

## 2021-05-22 DIAGNOSIS — Z1211 Encounter for screening for malignant neoplasm of colon: Secondary | ICD-10-CM | POA: Diagnosis not present

## 2021-05-24 ENCOUNTER — Ambulatory Visit
Admission: RE | Admit: 2021-05-24 | Discharge: 2021-05-24 | Disposition: A | Discharge status: Home / Self Care | Payer: PPO | Source: Ambulatory Visit | Attending: Family Medicine | Admitting: Family Medicine

## 2021-05-24 ENCOUNTER — Other Ambulatory Visit: Payer: Self-pay | Admitting: Family Medicine

## 2021-05-24 DIAGNOSIS — J189 Pneumonia, unspecified organism: Secondary | ICD-10-CM | POA: Diagnosis not present

## 2021-05-24 DIAGNOSIS — B59 Pneumocystosis: Secondary | ICD-10-CM

## 2021-05-24 DIAGNOSIS — J984 Other disorders of lung: Secondary | ICD-10-CM | POA: Diagnosis not present

## 2021-06-11 DIAGNOSIS — B349 Viral infection, unspecified: Secondary | ICD-10-CM | POA: Diagnosis not present

## 2021-06-11 DIAGNOSIS — Z03818 Encounter for observation for suspected exposure to other biological agents ruled out: Secondary | ICD-10-CM | POA: Diagnosis not present

## 2021-06-11 DIAGNOSIS — R509 Fever, unspecified: Secondary | ICD-10-CM | POA: Diagnosis not present

## 2021-06-11 DIAGNOSIS — R059 Cough, unspecified: Secondary | ICD-10-CM | POA: Diagnosis not present

## 2021-06-22 ENCOUNTER — Emergency Department (HOSPITAL_BASED_OUTPATIENT_CLINIC_OR_DEPARTMENT_OTHER): Payer: PPO

## 2021-06-22 ENCOUNTER — Other Ambulatory Visit: Payer: Self-pay

## 2021-06-22 ENCOUNTER — Emergency Department (HOSPITAL_BASED_OUTPATIENT_CLINIC_OR_DEPARTMENT_OTHER)
Admission: EM | Admit: 2021-06-22 | Discharge: 2021-06-22 | Disposition: A | Discharge status: Home / Self Care | Payer: PPO | Attending: Emergency Medicine | Admitting: Emergency Medicine

## 2021-06-22 ENCOUNTER — Encounter (HOSPITAL_BASED_OUTPATIENT_CLINIC_OR_DEPARTMENT_OTHER): Payer: Self-pay | Admitting: Emergency Medicine

## 2021-06-22 ENCOUNTER — Other Ambulatory Visit (HOSPITAL_BASED_OUTPATIENT_CLINIC_OR_DEPARTMENT_OTHER): Payer: Self-pay

## 2021-06-22 DIAGNOSIS — J181 Lobar pneumonia, unspecified organism: Secondary | ICD-10-CM | POA: Diagnosis not present

## 2021-06-22 DIAGNOSIS — R0602 Shortness of breath: Secondary | ICD-10-CM | POA: Diagnosis present

## 2021-06-22 DIAGNOSIS — R0600 Dyspnea, unspecified: Secondary | ICD-10-CM | POA: Diagnosis not present

## 2021-06-22 DIAGNOSIS — J069 Acute upper respiratory infection, unspecified: Secondary | ICD-10-CM | POA: Insufficient documentation

## 2021-06-22 DIAGNOSIS — J209 Acute bronchitis, unspecified: Secondary | ICD-10-CM | POA: Diagnosis not present

## 2021-06-22 DIAGNOSIS — J189 Pneumonia, unspecified organism: Secondary | ICD-10-CM | POA: Diagnosis not present

## 2021-06-22 DIAGNOSIS — R059 Cough, unspecified: Secondary | ICD-10-CM | POA: Diagnosis not present

## 2021-06-22 DIAGNOSIS — I700 Atherosclerosis of aorta: Secondary | ICD-10-CM | POA: Diagnosis not present

## 2021-06-22 DIAGNOSIS — Z20822 Contact with and (suspected) exposure to covid-19: Secondary | ICD-10-CM | POA: Insufficient documentation

## 2021-06-22 DIAGNOSIS — J40 Bronchitis, not specified as acute or chronic: Secondary | ICD-10-CM

## 2021-06-22 DIAGNOSIS — Q676 Pectus excavatum: Secondary | ICD-10-CM | POA: Diagnosis not present

## 2021-06-22 LAB — COMPREHENSIVE METABOLIC PANEL
ALT: 7 U/L (ref 0–44)
AST: 13 U/L — ABNORMAL LOW (ref 15–41)
Albumin: 4.2 g/dL (ref 3.5–5.0)
Alkaline Phosphatase: 41 U/L (ref 38–126)
Anion gap: 7 (ref 5–15)
BUN: 15 mg/dL (ref 8–23)
CO2: 27 mmol/L (ref 22–32)
Calcium: 9.1 mg/dL (ref 8.9–10.3)
Chloride: 107 mmol/L (ref 98–111)
Creatinine, Ser: 0.98 mg/dL (ref 0.61–1.24)
GFR, Estimated: >60 mL/min (ref >60)
Glucose, Bld: 95 mg/dL (ref 70–99)
Potassium: 4.9 mmol/L (ref 3.5–5.1)
Sodium: 141 mmol/L (ref 135–145)
Total Bilirubin: 0.5 mg/dL (ref 0.3–1.2)
Total Protein: 6.3 g/dL — ABNORMAL LOW (ref 6.5–8.1)

## 2021-06-22 LAB — RESP PANEL BY RT-PCR (FLU A&B, COVID) ARPGX2
Influenza A by PCR: NEGATIVE
Influenza B by PCR: NEGATIVE
SARS Coronavirus 2 by RT PCR: NEGATIVE

## 2021-06-22 LAB — CBC WITH DIFFERENTIAL/PLATELET
Abs Immature Granulocytes: 0.02 10*3/uL (ref 0.00–0.07)
Basophils Absolute: 0 10*3/uL (ref 0.0–0.1)
Basophils Relative: 0 %
Eosinophils Absolute: 0.1 10*3/uL (ref 0.0–0.5)
Eosinophils Relative: 2 %
HCT: 28.8 % — ABNORMAL LOW (ref 39.0–52.0)
Hemoglobin: 9.6 g/dL — ABNORMAL LOW (ref 13.0–17.0)
Immature Granulocytes: 0 %
Lymphocytes Relative: 20 %
Lymphs Abs: 1.4 10*3/uL (ref 0.7–4.0)
MCH: 33.4 pg (ref 26.0–34.0)
MCHC: 33.3 g/dL (ref 30.0–36.0)
MCV: 100.3 fL — ABNORMAL HIGH (ref 80.0–100.0)
Monocytes Absolute: 0.2 10*3/uL (ref 0.1–1.0)
Monocytes Relative: 3 %
Neutro Abs: 5.5 10*3/uL (ref 1.7–7.7)
Neutrophils Relative %: 75 %
Platelets: 313 10*3/uL (ref 150–400)
RBC: 2.87 MIL/uL — ABNORMAL LOW (ref 4.22–5.81)
RDW: 13.7 % (ref 11.5–15.5)
WBC: 7.2 10*3/uL (ref 4.0–10.5)
nRBC: 0 % (ref 0.0–0.2)

## 2021-06-22 MED ORDER — IOHEXOL 300 MG/ML  SOLN
75.0000 mL | Freq: Once | INTRAMUSCULAR | Status: AC | PRN
  Administered 2021-06-22: 75 mL via INTRAVENOUS

## 2021-06-22 MED ORDER — SODIUM CHLORIDE 0.9 % IV SOLN
1.0000 g | Freq: Once | INTRAVENOUS | Status: AC
  Administered 2021-06-22: 1 g via INTRAVENOUS
  Filled 2021-06-22: qty 10

## 2021-06-22 MED ORDER — AZITHROMYCIN 250 MG PO TABS
250.0000 mg | ORAL_TABLET | Freq: Every day | ORAL | 0 refills | Status: DC
  Filled 2021-06-22: qty 6, 5d supply, fill #0

## 2021-06-22 MED ORDER — AZITHROMYCIN 250 MG PO TABS
500.0000 mg | ORAL_TABLET | Freq: Once | ORAL | Status: AC
  Administered 2021-06-22: 500 mg via ORAL
  Filled 2021-06-22: qty 2

## 2021-06-22 NOTE — ED Triage Notes (Signed)
Pt has cough for several weeks that is wet sounding and productive. Pt was seen on 10/2 at [facility] and treated with otc medication for viral syndrome. Pt reports he still feels bad.

## 2021-06-22 NOTE — ED Provider Notes (Signed)
[facility] Provider Note   CSN: [id number] Arrival date & time: 06/22/21  7017     History Chief Complaint  Patient presents with   Shortness of Breath    Colin Calderon is a 70 y.o. male.  Patient presenting with cough short of wet and productive now for several weeks.  Patient initially started on doxycycline before August 29.  Seen here on August 29 chest x-ray raise concerns for right lower lobe pneumonia.  Continued on the doxycycline.  Patient had repeat chest x-ray done by primary care provider on September 14 that showed incomplete resolution of airspace disease in the right lung base.  Patient felt that there was some improvement.  He recently traveled to see his grandkids and they were sick this was at the beginning of October.  Home COVID testing's been negative.  His COVID testing on August 29 was negative.  But patient did have possible reexposure.  We will repeat COVID testing here.  Check chest x-ray and labs.  Patient may require CT of chest at this time.  Oxygen saturations 100% on room air.  Respirations 16 temp 97.7.  Patient states that he has had no fevers recently.  In addition although no notes available patient apparently was seen at [facility] on October 2 and treated with over-the-counter medications for a viral syndrome.  Past medical history noncontributory.  Patient never smoked.      History reviewed. No pertinent past medical history.  Patient Active Problem List   Diagnosis Date Noted   Bilateral ankle pain 02/23/2014    Past Surgical History:  Procedure Laterality Date   HERNIA REPAIR         No family history on file.  Social History   Tobacco Use   Smoking status: Never  Substance Use Topics   Alcohol use: Yes   Drug use: No    Home Medications Prior to Admission medications   Medication Sig Start Date End Date Taking? Authorizing Provider  azithromycin (ZITHROMAX) 250 MG tablet Take 1 tablet (250 mg  total) by mouth daily. Take first 2 tablets together, then 1 every day until finished. 06/22/21  Yes Fredia Sorrow, MD  azithromycin (ZITHROMAX) 250 MG tablet Take 1 tablet (250 mg total) by mouth daily. Take first 2 tablets together, then 1 every day until finished. Patient not taking: No sig reported 03/08/14   Bonnita Hollow, MD    Allergies    Patient has no known allergies.  Review of Systems   Review of Systems  Constitutional:  Negative for chills and fever.  HENT:  Negative for ear pain and sore throat.   Eyes:  Negative for pain and visual disturbance.  Respiratory:  Positive for cough and shortness of breath.   Cardiovascular:  Negative for chest pain and palpitations.  Gastrointestinal:  Negative for abdominal pain and vomiting.  Genitourinary:  Negative for dysuria and hematuria.  Musculoskeletal:  Negative for arthralgias and back pain.  Skin:  Negative for color change and rash.  Neurological:  Negative for seizures and syncope.  All other systems reviewed and are negative.  Physical Exam Updated Vital Signs BP 127/77   Pulse 67   Temp 97.7 F (36.5 C) (Oral)   Resp 12   SpO2 100%   Physical Exam Vitals and nursing note reviewed.  Constitutional:      General: He is not in acute distress.    Appearance: Normal appearance. He is well-developed.  HENT:     Head:  Normocephalic and atraumatic.  Eyes:     Conjunctiva/sclera: Conjunctivae normal.  Cardiovascular:     Rate and Rhythm: Normal rate and regular rhythm.     Heart sounds: No murmur heard. Pulmonary:     Effort: Pulmonary effort is normal. No respiratory distress.     Breath sounds: Normal breath sounds. No stridor. No wheezing, rhonchi or rales.  Chest:     Chest wall: No tenderness.  Abdominal:     Palpations: Abdomen is soft.     Tenderness: There is no abdominal tenderness.  Musculoskeletal:        General: No swelling.     Cervical back: Neck supple.  Skin:    General: Skin is warm and  dry.  Neurological:     General: No focal deficit present.     Mental Status: He is alert and oriented to person, place, and time.    ED Results / Procedures / Treatments   Labs (all labs ordered are listed, but only abnormal results are displayed) Labs Reviewed  COMPREHENSIVE METABOLIC PANEL - Abnormal; Notable for the following components:      Result Value   Total Protein 6.3 (*)    AST 13 (*)    All other components within normal limits  CBC WITH DIFFERENTIAL/PLATELET - Abnormal; Notable for the following components:   RBC 2.87 (*)    Hemoglobin 9.6 (*)    HCT 28.8 (*)    MCV 100.3 (*)    All other components within normal limits  RESP PANEL BY RT-PCR (FLU A&B, COVID) ARPGX2    EKG None  Radiology CT Chest W Contrast  Result Date: 06/22/2021 CLINICAL DATA:  Chronic dyspnea.  Cough for several weeks. EXAM: CT CHEST WITH CONTRAST TECHNIQUE: Multidetector CT imaging of the chest was performed during intravenous contrast administration. CONTRAST:  39mL OMNIPAQUE IOHEXOL 300 MG/ML  SOLN COMPARISON:  Plain film earlier today. Plain films back to 05/08/2021. No prior CT. FINDINGS: Cardiovascular: Normal caliber of the aorta and branch vessels. Tortuous thoracic aorta. Heart size accentuated by a pectus excavatum deformity. Lad coronary artery calcification. No central pulmonary embolism, on this non-dedicated study. Mediastinum/Nodes: No mediastinal or hilar adenopathy. Lungs/Pleura: No pleural fluid. Patent airways, including to the right lower lobe. Right lower lobe relatively linear consolidation, including on 133/4, typical of resolving infection and developing scar. Subtle left lower lobe peribronchovascular reticulonodular opacity, including on 142/4. Upper Abdomen: Normal imaged portions of the liver, spleen, stomach, pancreas, adrenal glands, kidneys. Musculoskeletal: No acute osseous abnormality. Mild pectus excavatum deformity. IMPRESSION: 1. Right lower lobe resolving pneumonia  and developing scar. Mild/subtle left lower lobe peribronchovascular reticulonodular opacities, suspicious for acute/subacute infection. 2. Coronary artery atherosclerosis. Aortic Atherosclerosis (ICD10-I70.0). Electronically Signed   By: Abigail Miyamoto M.D.   On: 06/22/2021 11:02   DG Chest Port 1 View  Result Date: 06/22/2021 CLINICAL DATA:  Persistent cough.  Pneumonia. EXAM: PORTABLE CHEST 1 VIEW COMPARISON:  05/24/2021 FINDINGS: Patchy right lower lobe airspace disease with mild interval improvement. This also has improved since the prior study of 05/08/2021. No new area of infiltrate or effusion. Heart size and vascularity normal. IMPRESSION: Improvement in right lower lobe airspace disease which may be clearing pneumonia, possibly with scarring. Continued follow-up until clearing recommended. Electronically Signed   By: Franchot Gallo M.D.   On: 06/22/2021 09:22    Procedures Procedures   Medications Ordered in ED Medications  cefTRIAXone (ROCEPHIN) 1 g in sodium chloride 0.9 % 100 mL IVPB (1 g Intravenous  New Bag/Given 06/22/21 1157)  iohexol (OMNIPAQUE) 300 MG/ML solution 75 mL (75 mLs Intravenous Contrast Given 06/22/21 1029)  azithromycin (ZITHROMAX) tablet 500 mg (500 mg Oral Given 06/22/21 1154)    ED Course  I have reviewed the triage vital signs and the nursing notes.  Pertinent labs & imaging results that were available during my care of the patient were reviewed by me and considered in my medical decision making (see chart for details).    MDM Rules/Calculators/A&P                          Work-up COVID testing flu testing negative.  Complete metabolic panel without any acute findings.  CBC significant for a lower hemoglobin.  Patient will need follow-up for that.  However his kidney function is returned to normal.  No leukocytosis.  CT chest shows that the right lower lobe is resolving pneumonia and developing scar no evidence of any mass.  However it is now picking up an  early acute subacute infection in the left lower lobe.  We will treat this with a gram of Rocephin IV and Zithromax.  And patient will follow up with primary care doctor.  Suspect that the cough and bronchitis component is probably due to a viral upper respiratory infection he picked up from his grandkids at the beginning of October.  We will continue his Mucinex DM    Final Clinical Impression(s) / ED Diagnoses Final diagnoses:  Upper respiratory tract infection, unspecified type  Bronchitis  Community acquired pneumonia of left lower lobe of lung    Rx / DC Orders ED Discharge Orders          Ordered    azithromycin (ZITHROMAX) 250 MG tablet  Daily        06/22/21 1206             Fredia Sorrow, MD 06/22/21 1206

## 2021-06-22 NOTE — Discharge Instructions (Addendum)
Take the rest of the Z-Pak as directed.  Prescription sent to the pharmacy here.  Make an appointment to follow-up with your primary care doctor for next week.  COVID testing influenza testing negative.  As we discussed showing the new sign of a left lower lobe pneumonia.  No evidence of any complicating factors from the previous right lower lobe pneumonia.  Kidney function is now back to normal.  But there is some worsening of anemia with her hemoglobin being a little bit lower its not in a danger zone but your regular doctor should recheck that.

## 2021-06-22 NOTE — ED Notes (Signed)
Reviewed dc instructions with patient. Patient voiced understanding.

## 2021-06-22 NOTE — ED Notes (Signed)
Patient arrived to the ED with cc of cough and not feeling well. Patient had pna in August 2022. Patient denies any pulmonary hx or work related exposure leading to pulmonary diagnoses. BBS diminished and O2 sats 97-100% on RA. Patient is in no distress at this time. Patient denies shob to myself, but that his cough is "nagging". He endorses that it is also productive. RT will continue to monitor respiratory needs throughout patient encounter.

## 2021-06-27 DIAGNOSIS — M545 Low back pain, unspecified: Secondary | ICD-10-CM | POA: Diagnosis not present

## 2021-06-30 DIAGNOSIS — D7589 Other specified diseases of blood and blood-forming organs: Secondary | ICD-10-CM | POA: Diagnosis not present

## 2021-06-30 DIAGNOSIS — N400 Enlarged prostate without lower urinary tract symptoms: Secondary | ICD-10-CM | POA: Diagnosis not present

## 2021-06-30 DIAGNOSIS — Z1211 Encounter for screening for malignant neoplasm of colon: Secondary | ICD-10-CM | POA: Diagnosis not present

## 2021-06-30 DIAGNOSIS — D649 Anemia, unspecified: Secondary | ICD-10-CM | POA: Diagnosis not present

## 2021-06-30 DIAGNOSIS — Z8701 Personal history of pneumonia (recurrent): Secondary | ICD-10-CM | POA: Diagnosis not present

## 2021-06-30 DIAGNOSIS — I2510 Atherosclerotic heart disease of native coronary artery without angina pectoris: Secondary | ICD-10-CM | POA: Diagnosis not present

## 2021-06-30 DIAGNOSIS — E538 Deficiency of other specified B group vitamins: Secondary | ICD-10-CM | POA: Diagnosis not present

## 2021-07-10 ENCOUNTER — Ambulatory Visit
Admission: RE | Admit: 2021-07-10 | Discharge: 2021-07-10 | Disposition: A | Discharge status: Home / Self Care | Payer: PPO | Source: Ambulatory Visit | Attending: Family Medicine | Admitting: Family Medicine

## 2021-07-10 ENCOUNTER — Other Ambulatory Visit: Payer: Self-pay | Admitting: Family Medicine

## 2021-07-10 ENCOUNTER — Other Ambulatory Visit: Payer: Self-pay

## 2021-07-10 DIAGNOSIS — Z09 Encounter for follow-up examination after completed treatment for conditions other than malignant neoplasm: Secondary | ICD-10-CM

## 2021-07-10 DIAGNOSIS — M545 Low back pain, unspecified: Secondary | ICD-10-CM | POA: Diagnosis not present

## 2021-07-10 DIAGNOSIS — Z8701 Personal history of pneumonia (recurrent): Secondary | ICD-10-CM | POA: Diagnosis not present

## 2021-07-13 DIAGNOSIS — M545 Low back pain, unspecified: Secondary | ICD-10-CM | POA: Diagnosis not present

## 2021-07-14 ENCOUNTER — Other Ambulatory Visit: Payer: Self-pay | Admitting: Family Medicine

## 2021-07-14 DIAGNOSIS — D539 Nutritional anemia, unspecified: Secondary | ICD-10-CM | POA: Diagnosis not present

## 2021-07-14 DIAGNOSIS — R059 Cough, unspecified: Secondary | ICD-10-CM | POA: Diagnosis not present

## 2021-07-14 DIAGNOSIS — I7090 Unspecified atherosclerosis: Secondary | ICD-10-CM | POA: Diagnosis not present

## 2021-07-19 DIAGNOSIS — M545 Low back pain, unspecified: Secondary | ICD-10-CM | POA: Diagnosis not present

## 2021-07-20 ENCOUNTER — Other Ambulatory Visit: Payer: PPO

## 2021-08-24 ENCOUNTER — Ambulatory Visit
Admission: RE | Admit: 2021-08-24 | Discharge: 2021-08-24 | Disposition: A | Discharge status: Home / Self Care | Payer: PPO | Source: Ambulatory Visit | Attending: Family Medicine | Admitting: Family Medicine

## 2021-08-24 ENCOUNTER — Other Ambulatory Visit: Payer: Self-pay

## 2021-08-24 ENCOUNTER — Other Ambulatory Visit: Payer: Self-pay | Admitting: Family Medicine

## 2021-08-24 DIAGNOSIS — R059 Cough, unspecified: Secondary | ICD-10-CM | POA: Diagnosis not present

## 2021-08-25 ENCOUNTER — Other Ambulatory Visit: Payer: Self-pay | Admitting: Family Medicine

## 2021-08-25 DIAGNOSIS — R059 Cough, unspecified: Secondary | ICD-10-CM

## 2021-09-19 DIAGNOSIS — D649 Anemia, unspecified: Secondary | ICD-10-CM | POA: Diagnosis not present

## 2021-09-19 DIAGNOSIS — Z1211 Encounter for screening for malignant neoplasm of colon: Secondary | ICD-10-CM | POA: Diagnosis not present

## 2021-09-19 DIAGNOSIS — D124 Benign neoplasm of descending colon: Secondary | ICD-10-CM | POA: Diagnosis not present

## 2021-09-19 DIAGNOSIS — K635 Polyp of colon: Secondary | ICD-10-CM | POA: Diagnosis not present

## 2021-09-19 DIAGNOSIS — Z86010 Personal history of colonic polyps: Secondary | ICD-10-CM | POA: Diagnosis not present

## 2021-09-19 HISTORY — PX: COLONOSCOPY: SHX174

## 2021-09-25 DIAGNOSIS — M545 Low back pain, unspecified: Secondary | ICD-10-CM | POA: Diagnosis not present

## 2021-09-25 DIAGNOSIS — M5432 Sciatica, left side: Secondary | ICD-10-CM | POA: Diagnosis not present

## 2021-11-14 DIAGNOSIS — E538 Deficiency of other specified B group vitamins: Secondary | ICD-10-CM | POA: Diagnosis not present

## 2021-11-14 DIAGNOSIS — N400 Enlarged prostate without lower urinary tract symptoms: Secondary | ICD-10-CM | POA: Diagnosis not present

## 2021-11-14 DIAGNOSIS — D7589 Other specified diseases of blood and blood-forming organs: Secondary | ICD-10-CM | POA: Diagnosis not present

## 2021-11-20 ENCOUNTER — Telehealth: Payer: Self-pay | Admitting: Hematology and Oncology

## 2021-11-20 NOTE — Telephone Encounter (Signed)
Scheduled appt per 3/13 referral. Pt is aware of appt date and time. Pt is aware to arrive 15 mins prior to appt time and to bring and updated insurance card. Pt is aware of appt location.   ?

## 2021-12-04 NOTE — Progress Notes (Signed)
[place] ?CONSULT NOTE ? ?Patient Care Team: ?Vernie Shanks, MD as PCP - General (Family Medicine) ? ?CHIEF COMPLAINTS/PURPOSE OF CONSULTATION:  ?Macrocytosis ? ?HISTORY OF PRESENTING ILLNESS:  ?Colin Calderon 71 y.o. male is here because of recent diagnosis of Macrocytosis. He presents to the clinic today for a consult. ?He has kept up his logs of his blood work for onwards.  His hemoglobin has declined slowly over the past 13 years.  He had a diagnosis of B12 deficiency with a B12 level of 135 in 2017 and since then he has been on 1000 mcg B12 supplementation.  In spite of that his macrocytosis has not fully corrected.  His MCV always ranges between 100-102.  He had seen a new primary care physician who referred him to [place] for discussion regarding his mild macrocytic anemia. ? ? ?I reviewed her records extensively and collaborated the history with the patient. ?  ? ? ?MEDICAL HISTORY:  ?No past medical history on file. ? ?SURGICAL HISTORY: ?Past Surgical History:  ?Procedure Laterality Date  ? HERNIA REPAIR    ? ? ?SOCIAL HISTORY: ?Social History  ? ?Socioeconomic History  ? Marital status: Married  ?  Spouse name: Not on file  ? Number of children: Not on file  ? Years of education: Not on file  ? Highest education level: Not on file  ?Occupational History  ? Not on file  ?Tobacco Use  ? Smoking status: Never  ? Smokeless tobacco: Not on file  ?Substance and Sexual Activity  ? Alcohol use: Yes  ? Drug use: No  ? Sexual activity: Not on file  ?Other Topics Concern  ? Not on file  ?Social History Narrative  ? Not on file  ? ?Social Determinants of Health  ? ?Financial Resource Strain: Not on file  ?Food Insecurity: Not on file  ?Transportation Needs: Not on file  ?Physical Activity: Not on file  ?Stress: Not on file  ?Social Connections: Not on file  ?Intimate Partner Violence: Not on file  ? ? ?FAMILY HISTORY: ?No family history on file. ? ?ALLERGIES:  has No Known Allergies. ? ?MEDICATIONS:   ?No current outpatient medications on file.  ? ?No current facility-administered medications for this visit.  ? ? ?REVIEW OF SYSTEMS:   ?Constitutional: Denies fevers, chills or abnormal night sweats ?  ?All other systems were reviewed with the patient and are negative. ? ?PHYSICAL EXAMINATION: ?ECOG PERFORMANCE STATUS: 1 - Symptomatic but completely ambulatory ? ?Vitals:  ? 12/05/21 0908  ?BP: (!) 183/89  ?Pulse: 71  ?Resp: 18  ?Temp: 97.9 ?F (36.6 ?C)  ?SpO2: 98%  ? ?Filed Weights  ? 12/05/21 0908  ?Weight: 172 lb 9.6 oz (78.3 kg)  ? ?  ? ? ?LABORATORY DATA:  ?I have reviewed the data as listed ?Lab Results  ?Component Value Date  ? WBC 7.2 06/22/2021  ? HGB 9.6 (L) 06/22/2021  ? HCT 28.8 (L) 06/22/2021  ? MCV 100.3 (H) 06/22/2021  ? PLT 313 06/22/2021  ? ?Lab Results  ?Component Value Date  ? NA 141 06/22/2021  ? K 4.9 06/22/2021  ? CL 107 06/22/2021  ? CO2 27 06/22/2021  ? ? ?RADIOGRAPHIC STUDIES: ?I have personally reviewed the radiological reports and agreed with the findings in the report. ? ?ASSESSMENT AND PLAN:  ?Macrocytosis ?Lab review: ?04/05/2021: WBC 3.7, hemoglobin 12.1, platelets 227, MCV 99.7 ?07/14/2021: WBC 3.8, hemoglobin 11.3, platelets 231, MCV 99.3, MCH 34.5, RDW 15.5 ?11/15/2021: Hemoglobin 11.3, MCV 101.5,  WBC 3.8, platelets 234, normal differential ? ?Additional labs done previously: X01 720, folic acid 91.0 anti-intrinsic factor antibody: Normal, TSH 1.3, CMP: Normal ? ?Differential for macrocytosis: ?1.  B12 and folate deficiencies: B12 levels are still slightly low in spite of oral B12 supplementation.  I recommended switching to 5000 mcg of B12 sublingually. ?2. alcohol and liver disease: Patient does not drink alcohol and has no liver disease.  He does not take any medications either. ?3. Bone marrow disorders like MDS ? ?Based on the lack of severe anemia, I did not recommend a bone marrow biopsy at this time.  The rest of the blood work appears to be intact. ?This can be watched and  monitored. ?Return to clinic in 2 months for labs and follow-up ?I will check for antiparietal cell antibody as well as erythropoietin levels. ? ?All questions were answered. The patient knows to call the clinic with any problems, questions or concerns. ?  ? Harriette Ohara, MD ?12/05/21 ? I Gardiner Coins, am acting as a scribe for Dr. Lindi Adie  ?

## 2021-12-05 ENCOUNTER — Other Ambulatory Visit: Payer: Self-pay | Admitting: *Deleted

## 2021-12-05 ENCOUNTER — Other Ambulatory Visit: Payer: Self-pay

## 2021-12-05 ENCOUNTER — Inpatient Hospital Stay: Payer: PPO | Attending: Hematology and Oncology | Admitting: Hematology and Oncology

## 2021-12-05 DIAGNOSIS — D7589 Other specified diseases of blood and blood-forming organs: Secondary | ICD-10-CM

## 2021-12-05 DIAGNOSIS — E538 Deficiency of other specified B group vitamins: Secondary | ICD-10-CM | POA: Insufficient documentation

## 2021-12-05 DIAGNOSIS — Z79899 Other long term (current) drug therapy: Secondary | ICD-10-CM | POA: Diagnosis not present

## 2021-12-05 DIAGNOSIS — D649 Anemia, unspecified: Secondary | ICD-10-CM | POA: Insufficient documentation

## 2021-12-05 NOTE — Progress Notes (Signed)
Pt requesting hard script for p.o B12. RN left script at front desk for pt to pick up.  Pt verbalized understanding.  ?

## 2021-12-05 NOTE — Assessment & Plan Note (Signed)
Lab review: ?04/05/2021: WBC 3.7, hemoglobin 12.1, platelets 227, MCV 99.7 ?07/14/2021: WBC 3.8, hemoglobin 11.3, platelets 231, MCV 99.3, MCH 34.5, RDW 15.5 ?11/15/2021: Hemoglobin 11.3, MCV 101.5, WBC 3.8, platelets 234, normal differential ? ?Additional labs done previously: I21 798, folic acid 10.2 anti-intrinsic factor antibody: Normal, TSH 1.3, CMP: Normal ? ?Differential for macrocytosis: ?1.  B12 and folate deficiencies: Both levels were normal ?2. Medications ?3.  Bone marrow disorders like MDS ? ?Based on the lack of severe anemia, I did not recommend a bone marrow biopsy at this time.  The rest of the blood work appears to be intact. ?This can be watched and monitored. ?Return to clinic in 6 months for labs and follow-up ?

## 2022-01-26 NOTE — Progress Notes (Signed)
Patient Care Team: Vernie Shanks, MD as PCP - General (Family Medicine)  DIAGNOSIS:  Encounter Diagnosis  Name Primary?   Macrocytosis     CHIEF COMPLIANT: Macrocytosis  INTERVAL HISTORY: Colin Calderon is a 71 y.o. male is here because of recent diagnosis of Macrocytosis. He presents to the clinic today for a follow-up.  He has been on sublingual B12 5000 mcg dosage and he is here 2 months later to check His labs.  Over the past 3 days he had food poisoning and was feeling ill.  Denies any fevers or chills.  He had not taken any antibiotics.   ALLERGIES:  has No Known Allergies.    PHYSICAL EXAMINATION: ECOG PERFORMANCE STATUS: 1 - Symptomatic but completely ambulatory  Vitals:   02/07/22 0832  BP: (!) 144/79  Pulse: 63  Resp: 18  Temp: (!) 97.5 F (36.4 C)  SpO2: 100%   Filed Weights   02/07/22 0832  Weight: 173 lb 14.4 oz (78.9 kg)     LABORATORY DATA:  I have reviewed the data as listed    Latest Ref Rng & Units 06/22/2021    8:54 AM 05/08/2021    1:03 PM 03/08/2014    4:50 PM  CMP  Glucose 70 - 99 mg/dL 95   96   147    BUN 8 - 23 mg/dL _0 Creatinine 0.61 - 1.24 mg/dL 0.98   1.41   1.21    Sodium 135 - 145 mmol/L 141   138   135    Potassium 3.5 - 5.1 mmol/L 4.9   3.9   4.0    Chloride 98 - 111 mmol/L 107   100   97    CO2 22 - 32 mmol/L _1 Calcium 8.9 - 10.3 mg/dL 9.1   9.6   8.6    Total Protein 6.5 - 8.1 g/dL 6.3   7.6     Total Bilirubin 0.3 - 1.2 mg/dL 0.5   0.3     Alkaline Phos 38 - 126 U/L 41   48     AST 15 - 41 U/L 13   14     ALT 0 - 44 U/L 7   13       Lab Results  Component Value Date   WBC 2.9 (L) 02/07/2022   HGB 11.2 (L) 02/07/2022   HCT 32.8 (L) 02/07/2022   MCV 101.2 (H) 02/07/2022   PLT 209 02/07/2022   NEUTROABS 1.4 (L) 02/07/2022    ASSESSMENT & PLAN:  Macrocytosis Lab review: 04/05/2021: WBC 3.7, hemoglobin 12.1, platelets 227, MCV 99.7 07/14/2021: WBC 3.8, hemoglobin 11.3, platelets 231,  MCV 99.3, MCH 34.5, RDW 15.5 11/15/2021: Hemoglobin 11.3, MCV 101.5, WBC 3.8, platelets 234, normal differential 02/07/2022: Hemoglobin 11.2, MCV 101, WBC 2.9, ANC 1.4, B12 and anti-intrinsic factor antibodies are pending   Additional labs done previously: O67 672, folic acid 09.4 anti-intrinsic factor antibody: Normal, TSH 1.3, CMP: Normal   Differential for macrocytosis: 1.  B12 and folate deficiencies: On 5000 mcg of B12 sublingually. 2. Bone marrow disorders like MDS  Low ANC and white blood cell count: I discussed with him that usually this is related to either infection or new medication.  Over the past 3 days he had a food poisoning event and he was not eating much and he was feeling ill.  It is very possible that  the low white count could be related to that.  We decided to watch and monitor for another month and recheck his CBC and decide if he needs a bone marrow biopsy.    No orders of the defined types were placed in this encounter.  The patient has a good understanding of the overall plan. he agrees with it. he will call with any problems that may develop before the next visit here. Total time spent: 30 mins including face to face time and time spent for planning, charting and co-ordination of care   Harriette Ohara, MD 02/07/22    I Gardiner Coins am scribing for Dr. Lindi Adie  I have reviewed the above documentation for accuracy and completeness, and I agree with the above.

## 2022-02-07 ENCOUNTER — Other Ambulatory Visit: Payer: Self-pay

## 2022-02-07 ENCOUNTER — Inpatient Hospital Stay: Payer: PPO | Attending: Hematology and Oncology

## 2022-02-07 ENCOUNTER — Inpatient Hospital Stay: Payer: PPO | Admitting: Hematology and Oncology

## 2022-02-07 DIAGNOSIS — D7589 Other specified diseases of blood and blood-forming organs: Secondary | ICD-10-CM | POA: Insufficient documentation

## 2022-02-07 LAB — CBC WITH DIFFERENTIAL (CANCER CENTER ONLY)
Abs Immature Granulocytes: 0.01 10*3/uL (ref 0.00–0.07)
Basophils Absolute: 0 10*3/uL (ref 0.0–0.1)
Basophils Relative: 0 %
Eosinophils Absolute: 0.1 10*3/uL (ref 0.0–0.5)
Eosinophils Relative: 2 %
HCT: 32.8 % — ABNORMAL LOW (ref 39.0–52.0)
Hemoglobin: 11.2 g/dL — ABNORMAL LOW (ref 13.0–17.0)
Immature Granulocytes: 0 %
Lymphocytes Relative: 46 %
Lymphs Abs: 1.3 10*3/uL (ref 0.7–4.0)
MCH: 34.6 pg — ABNORMAL HIGH (ref 26.0–34.0)
MCHC: 34.1 g/dL (ref 30.0–36.0)
MCV: 101.2 fL — ABNORMAL HIGH (ref 80.0–100.0)
Monocytes Absolute: 0.1 10*3/uL (ref 0.1–1.0)
Monocytes Relative: 3 %
Neutro Abs: 1.4 10*3/uL — ABNORMAL LOW (ref 1.7–7.7)
Neutrophils Relative %: 49 %
Platelet Count: 209 10*3/uL (ref 150–400)
RBC: 3.24 MIL/uL — ABNORMAL LOW (ref 4.22–5.81)
RDW: 12.8 % (ref 11.5–15.5)
WBC Count: 2.9 10*3/uL — ABNORMAL LOW (ref 4.0–10.5)
nRBC: 0 % (ref 0.0–0.2)

## 2022-02-07 LAB — VITAMIN B12: Vitamin B-12: 762 pg/mL (ref 180–914)

## 2022-02-07 NOTE — Assessment & Plan Note (Signed)
Lab review: 04/05/2021: WBC 3.7, hemoglobin 12.1, platelets 227, MCV 99.7 07/14/2021: WBC 3.8, hemoglobin 11.3, platelets 231, MCV 99.3, MCH 34.5, RDW 15.5 11/15/2021: Hemoglobin 11.3, MCV 101.5, WBC 3.8, platelets 234, normal differential 02/07/2022:  Additional labs done previously: J19 147, folic acid 82.9 anti-intrinsic factor antibody: Normal, TSH 1.3, CMP: Normal  Differential for macrocytosis: 1.  B12 and folate deficiencies: B12 levels are still slightly low in spite of oral B12 supplementation.  I recommended switching to 5000 mcg of B12 sublingually. 2. alcohol and liver disease: Patient does not drink alcohol and has no liver disease.  He does not take any medications either. 3. Bone marrow disorders like MDS  We decided not to do a bone marrow biopsy previously because the anemia was not severe enough.

## 2022-02-08 LAB — ERYTHROPOIETIN: Erythropoietin: 36.7 m[IU]/mL — ABNORMAL HIGH (ref 2.6–18.5)

## 2022-02-08 LAB — ANTI-PARIETAL ANTIBODY: Parietal Cell Antibody-IgG: 1 Units (ref 0.0–20.0)

## 2022-03-01 NOTE — Progress Notes (Signed)
Patient Care Team: Vernie Shanks, MD as PCP - General (Family Medicine)  DIAGNOSIS:  Encounter Diagnosis  Name Primary?   Macrocytosis      CHIEF COMPLIANT: Macrocytosis  INTERVAL HISTORY: Colin Calderon is a 71 y.o. male is here because of recent diagnosis of Macrocytosis. He presents to the clinic today for a follow-up. States that his results of b12 level was good. States that everything was going good.   ALLERGIES:  is allergic to amoxicillin.  MEDICATIONS:  Current Outpatient Medications  Medication Sig Dispense Refill   Cyanocobalamin 5000 MCG/ML LIQD 1 tablet 1 day or 1 dose.     No current facility-administered medications for this visit.    PHYSICAL EXAMINATION: ECOG PERFORMANCE STATUS: 1 - Symptomatic but completely ambulatory  Vitals:   03/14/22 0821  BP: 136/85  Pulse: 64  Resp: 18  Temp: (!) 97.2 F (36.2 C)  SpO2: 100%   Filed Weights   03/14/22 0821  Weight: 176 lb 6.4 oz (80 kg)      LABORATORY DATA:  I have reviewed the data as listed    Latest Ref Rng & Units 06/22/2021    8:54 AM 05/08/2021    1:03 PM 03/08/2014    4:50 PM  CMP  Glucose 70 - 99 mg/dL 95  96  147   BUN 8 - 23 mg/dL _0 Creatinine 0.61 - 1.24 mg/dL 0.98  1.41  1.21   Sodium 135 - 145 mmol/L 141  138  135   Potassium 3.5 - 5.1 mmol/L 4.9  3.9  4.0   Chloride 98 - 111 mmol/L 107  100  97   CO2 22 - 32 mmol/L _1 Calcium 8.9 - 10.3 mg/dL 9.1  9.6  8.6   Total Protein 6.5 - 8.1 g/dL 6.3  7.6    Total Bilirubin 0.3 - 1.2 mg/dL 0.5  0.3    Alkaline Phos 38 - 126 U/L 41  48    AST 15 - 41 U/L 13  14    ALT 0 - 44 U/L 7  13      Lab Results  Component Value Date   WBC 3.8 (L) 03/14/2022   HGB 11.2 (L) 03/14/2022   HCT 31.9 (L) 03/14/2022   MCV 100.0 03/14/2022   PLT 209 03/14/2022   NEUTROABS 1.7 03/14/2022    ASSESSMENT & PLAN:  Macrocytosis Lab review: 04/05/2021: WBC 3.7, hemoglobin 12.1, platelets 227, MCV 99.7 07/14/2021: WBC 3.8,  hemoglobin 11.3, platelets 231, MCV 99.3, MCH 34.5, RDW 15.5 11/15/2021: Hemoglobin 11.3, MCV 101.5, WBC 3.8, platelets 234, normal differential 02/07/2022: Hemoglobin 11.2, MCV 101, WBC 2.9, ANC 1.4, B12 and antiparietal cell antibody: Negative 03/14/2022: Hemoglobin 11.2, MCV 100, WBC 3.8, ANC 1.7   Additional labs done previously: H68 372, folic acid 90.2 anti-intrinsic factor antibody: Negative, TSH 1.3, CMP: Normal   Differential for macrocytosis: 1.  B12 and folate deficiencies: On 5000 mcg of B12 sublingually. 2. Bone marrow disorders like MDS 3.  Underlying illnesses (he had an episode of food poisoning previously)  Based on improvement in his WBC count and the MCV, we decided not to do a bone marrow biopsy. Return to clinic in 6 months with labs and follow-up.  If he is doing well we can see in 1 year.   Orders Placed This Encounter  Procedures   CBC with Differential ([place] Only)    Standing Status:  Future    Standing Expiration Date:   03/15/2023   Vitamin B12    Standing Status:   Future    Standing Expiration Date:   03/15/2023   The patient has a good understanding of the overall plan. he agrees with it. he will call with any problems that may develop before the next visit here. Total time spent: 30 mins including face to face time and time spent for planning, charting and co-ordination of care   Harriette Ohara, MD 03/14/22    I Gardiner Coins am scribing for Dr. Lindi Adie  I have reviewed the above documentation for accuracy and completeness, and I agree with the above.

## 2022-03-14 ENCOUNTER — Inpatient Hospital Stay: Payer: PPO | Attending: Hematology and Oncology

## 2022-03-14 ENCOUNTER — Inpatient Hospital Stay: Payer: PPO | Admitting: Hematology and Oncology

## 2022-03-14 ENCOUNTER — Other Ambulatory Visit: Payer: Self-pay | Admitting: *Deleted

## 2022-03-14 ENCOUNTER — Other Ambulatory Visit: Payer: Self-pay

## 2022-03-14 DIAGNOSIS — D7589 Other specified diseases of blood and blood-forming organs: Secondary | ICD-10-CM

## 2022-03-14 LAB — CBC WITH DIFFERENTIAL (CANCER CENTER ONLY)
Abs Immature Granulocytes: 0.01 10*3/uL (ref 0.00–0.07)
Basophils Absolute: 0 10*3/uL (ref 0.0–0.1)
Basophils Relative: 1 %
Eosinophils Absolute: 0.1 10*3/uL (ref 0.0–0.5)
Eosinophils Relative: 3 %
HCT: 31.9 % — ABNORMAL LOW (ref 39.0–52.0)
Hemoglobin: 11.2 g/dL — ABNORMAL LOW (ref 13.0–17.0)
Immature Granulocytes: 0 %
Lymphocytes Relative: 47 %
Lymphs Abs: 1.8 10*3/uL (ref 0.7–4.0)
MCH: 35.1 pg — ABNORMAL HIGH (ref 26.0–34.0)
MCHC: 35.1 g/dL (ref 30.0–36.0)
MCV: 100 fL (ref 80.0–100.0)
Monocytes Absolute: 0.1 10*3/uL (ref 0.1–1.0)
Monocytes Relative: 4 %
Neutro Abs: 1.7 10*3/uL (ref 1.7–7.7)
Neutrophils Relative %: 45 %
Platelet Count: 209 10*3/uL (ref 150–400)
RBC: 3.19 MIL/uL — ABNORMAL LOW (ref 4.22–5.81)
RDW: 12.7 % (ref 11.5–15.5)
WBC Count: 3.8 10*3/uL — ABNORMAL LOW (ref 4.0–10.5)
nRBC: 0 % (ref 0.0–0.2)

## 2022-03-14 LAB — RETIC PANEL
Immature Retic Fract: 19.1 % — ABNORMAL HIGH (ref 2.3–15.9)
RBC.: 3.24 MIL/uL — ABNORMAL LOW (ref 4.22–5.81)
Retic Count, Absolute: 33.7 10*3/uL (ref 19.0–186.0)
Retic Ct Pct: 1 % (ref 0.4–3.1)
Reticulocyte Hemoglobin: 35.6 pg (ref >27.9)

## 2022-03-14 LAB — VITAMIN B12: Vitamin B-12: 671 pg/mL (ref 180–914)

## 2022-03-14 NOTE — Assessment & Plan Note (Addendum)
Lab review: 04/05/2021: WBC 3.7, hemoglobin 12.1, platelets 227, MCV 99.7 07/14/2021: WBC 3.8, hemoglobin 11.3, platelets 231, MCV 99.3, MCH 34.5, RDW 15.5 11/15/2021: Hemoglobin 11.3, MCV 101.5, WBC 3.8, platelets 234, normal differential 02/07/2022: Hemoglobin 11.2, MCV 101, WBC 2.9, ANC 1.4, B12 and antiparietal cell antibody: Negative  Additional labs done previously: A12 240, folic acid 01.8 anti-intrinsic factor antibody: Negative, TSH 1.3, CMP: Normal  Differential for macrocytosis: 1.B12 and folate deficiencies: On 5000 mcg of B12 sublingually. 2.Bone marrow disorders like MDS 3.  Underlying illnesses (he had an episode of food poisoning previously)

## 2022-03-19 ENCOUNTER — Ambulatory Visit: Payer: PPO | Admitting: Hematology and Oncology

## 2022-03-19 ENCOUNTER — Other Ambulatory Visit: Payer: PPO

## 2022-03-30 DIAGNOSIS — H52203 Unspecified astigmatism, bilateral: Secondary | ICD-10-CM | POA: Diagnosis not present

## 2022-03-30 DIAGNOSIS — H25041 Posterior subcapsular polar age-related cataract, right eye: Secondary | ICD-10-CM | POA: Diagnosis not present

## 2022-03-30 DIAGNOSIS — H5213 Myopia, bilateral: Secondary | ICD-10-CM | POA: Diagnosis not present

## 2022-04-11 DIAGNOSIS — I7090 Unspecified atherosclerosis: Secondary | ICD-10-CM | POA: Diagnosis not present

## 2022-04-11 DIAGNOSIS — Z125 Encounter for screening for malignant neoplasm of prostate: Secondary | ICD-10-CM | POA: Diagnosis not present

## 2022-04-11 DIAGNOSIS — Z0000 Encounter for general adult medical examination without abnormal findings: Secondary | ICD-10-CM | POA: Diagnosis not present

## 2022-04-11 DIAGNOSIS — R7301 Impaired fasting glucose: Secondary | ICD-10-CM | POA: Diagnosis not present

## 2022-04-11 DIAGNOSIS — D7589 Other specified diseases of blood and blood-forming organs: Secondary | ICD-10-CM | POA: Diagnosis not present

## 2022-04-11 DIAGNOSIS — E538 Deficiency of other specified B group vitamins: Secondary | ICD-10-CM | POA: Diagnosis not present

## 2022-04-11 DIAGNOSIS — Z8349 Family history of other endocrine, nutritional and metabolic diseases: Secondary | ICD-10-CM | POA: Diagnosis not present

## 2022-04-25 ENCOUNTER — Telehealth: Payer: Self-pay | Admitting: Hematology and Oncology

## 2022-04-25 NOTE — Telephone Encounter (Signed)
Scheduled appointment per 7/5 los. Left voicemail.

## 2022-05-07 DIAGNOSIS — E538 Deficiency of other specified B group vitamins: Secondary | ICD-10-CM | POA: Diagnosis not present

## 2022-05-07 DIAGNOSIS — D7589 Other specified diseases of blood and blood-forming organs: Secondary | ICD-10-CM | POA: Diagnosis not present

## 2022-05-07 DIAGNOSIS — Z0000 Encounter for general adult medical examination without abnormal findings: Secondary | ICD-10-CM | POA: Diagnosis not present

## 2022-05-07 DIAGNOSIS — Z8349 Family history of other endocrine, nutritional and metabolic diseases: Secondary | ICD-10-CM | POA: Diagnosis not present

## 2022-05-07 DIAGNOSIS — R7301 Impaired fasting glucose: Secondary | ICD-10-CM | POA: Diagnosis not present

## 2022-05-07 DIAGNOSIS — D649 Anemia, unspecified: Secondary | ICD-10-CM | POA: Diagnosis not present

## 2022-05-07 DIAGNOSIS — Z125 Encounter for screening for malignant neoplasm of prostate: Secondary | ICD-10-CM | POA: Diagnosis not present

## 2022-05-07 DIAGNOSIS — I2510 Atherosclerotic heart disease of native coronary artery without angina pectoris: Secondary | ICD-10-CM | POA: Diagnosis not present

## 2022-08-16 DIAGNOSIS — R051 Acute cough: Secondary | ICD-10-CM | POA: Diagnosis not present

## 2022-09-11 NOTE — Progress Notes (Incomplete)
   Patient Care Team: Vernie Shanks, MD (Inactive) as PCP - General (Family Medicine)  DIAGNOSIS: No diagnosis found.  SUMMARY OF ONCOLOGIC HISTORY: Oncology History   No history exists.    CHIEF COMPLIANT: Macrocytosis   INTERVAL HISTORY: Colin Calderon is a 72 y.o. male is here because of recent diagnosis of Macrocytosis. He presents to the clinic today for a follow-up.    ALLERGIES:  is allergic to amoxicillin.  MEDICATIONS:  Current Outpatient Medications  Medication Sig Dispense Refill   Cyanocobalamin 5000 MCG/ML LIQD 1 tablet 1 day or 1 dose.     No current facility-administered medications for this visit.    PHYSICAL EXAMINATION: ECOG PERFORMANCE STATUS: {CHL ONC ECOG PS:[id number]}  There were no vitals filed for this visit. There were no vitals filed for this visit.  BREAST:*** No palpable masses or nodules in either right or left breasts. No palpable axillary supraclavicular or infraclavicular adenopathy no breast tenderness or nipple discharge. (exam performed in the presence of a chaperone)  LABORATORY DATA:  I have reviewed the data as listed    Latest Ref Rng & Units 06/22/2021    8:54 AM 05/08/2021    1:03 PM 03/08/2014    4:50 PM  CMP  Glucose 70 - 99 mg/dL 95  96  147   BUN 8 - 23 mg/dL '15  24  17   '$ Creatinine 0.61 - 1.24 mg/dL 0.98  1.41  1.21   Sodium 135 - 145 mmol/L 141  138  135   Potassium 3.5 - 5.1 mmol/L 4.9  3.9  4.0   Chloride 98 - 111 mmol/L 107  100  97   CO2 22 - 32 mmol/L '27  26  25   '$ Calcium 8.9 - 10.3 mg/dL 9.1  9.6  8.6   Total Protein 6.5 - 8.1 g/dL 6.3  7.6    Total Bilirubin 0.3 - 1.2 mg/dL 0.5  0.3    Alkaline Phos 38 - 126 U/L 41  48    AST 15 - 41 U/L 13  14    ALT 0 - 44 U/L 7  13      Lab Results  Component Value Date   WBC 3.8 (L) 03/14/2022   HGB 11.2 (L) 03/14/2022   HCT 31.9 (L) 03/14/2022   MCV 100.0 03/14/2022   PLT 209 03/14/2022   NEUTROABS 1.7 03/14/2022    ASSESSMENT & PLAN:  No  problem-specific Assessment & Plan notes found for this encounter.    No orders of the defined types were placed in this encounter.  The patient has a good understanding of the overall plan. he agrees with it. he will call with any problems that may develop before the next visit here. Total time spent: 30 mins including face to face time and time spent for planning, charting and co-ordination of care   Suzzette Righter, [place] 09/11/22    I Gardiner Coins am acting as a Education administrator for [employer]  ***

## 2022-09-17 ENCOUNTER — Other Ambulatory Visit: Payer: PPO

## 2022-09-17 ENCOUNTER — Ambulatory Visit: Payer: PPO | Admitting: Hematology and Oncology

## 2022-09-18 DIAGNOSIS — J019 Acute sinusitis, unspecified: Secondary | ICD-10-CM | POA: Diagnosis not present

## 2022-09-20 ENCOUNTER — Inpatient Hospital Stay: Payer: PPO | Attending: Hematology and Oncology

## 2022-09-20 ENCOUNTER — Inpatient Hospital Stay: Payer: PPO | Admitting: Hematology and Oncology

## 2022-09-20 ENCOUNTER — Other Ambulatory Visit: Payer: Self-pay

## 2022-09-20 VITALS — BP 124/75 | HR 71 | Temp 97.8°F | Wt 177.5 lb

## 2022-09-20 DIAGNOSIS — Z79899 Other long term (current) drug therapy: Secondary | ICD-10-CM | POA: Diagnosis not present

## 2022-09-20 DIAGNOSIS — D7589 Other specified diseases of blood and blood-forming organs: Secondary | ICD-10-CM | POA: Insufficient documentation

## 2022-09-20 LAB — CBC WITH DIFFERENTIAL (CANCER CENTER ONLY)
Abs Immature Granulocytes: 0.02 10*3/uL (ref 0.00–0.07)
Basophils Absolute: 0 10*3/uL (ref 0.0–0.1)
Basophils Relative: 0 %
Eosinophils Absolute: 0 10*3/uL (ref 0.0–0.5)
Eosinophils Relative: 1 %
HCT: 33.2 % — ABNORMAL LOW (ref 39.0–52.0)
Hemoglobin: 11.2 g/dL — ABNORMAL LOW (ref 13.0–17.0)
Immature Granulocytes: 0 %
Lymphocytes Relative: 26 %
Lymphs Abs: 1.3 10*3/uL (ref 0.7–4.0)
MCH: 34.1 pg — ABNORMAL HIGH (ref 26.0–34.0)
MCHC: 33.7 g/dL (ref 30.0–36.0)
MCV: 101.2 fL — ABNORMAL HIGH (ref 80.0–100.0)
Monocytes Absolute: 0.2 10*3/uL (ref 0.1–1.0)
Monocytes Relative: 4 %
Neutro Abs: 3.4 10*3/uL (ref 1.7–7.7)
Neutrophils Relative %: 69 %
Platelet Count: 294 10*3/uL (ref 150–400)
RBC: 3.28 MIL/uL — ABNORMAL LOW (ref 4.22–5.81)
RDW: 13.2 % (ref 11.5–15.5)
WBC Count: 4.9 10*3/uL (ref 4.0–10.5)
nRBC: 0 % (ref 0.0–0.2)

## 2022-09-20 LAB — VITAMIN B12: Vitamin B-12: 460 pg/mL (ref 180–914)

## 2022-09-20 NOTE — Assessment & Plan Note (Signed)
Lab review: 04/05/2021: WBC 3.7, hemoglobin 12.1, platelets 227, MCV 99.7 07/14/2021: WBC 3.8, hemoglobin 11.3, platelets 231, MCV 99.3, MCH 34.5, RDW 15.5 11/15/2021: Hemoglobin 11.3, MCV 101.5, WBC 3.8, platelets 234, normal differential 02/07/2022: Hemoglobin 11.2, MCV 101, WBC 2.9, ANC 1.4, B12 and antiparietal cell antibody: Negative 03/14/2022: Hemoglobin 11.2, MCV 100, WBC 3.8, ANC 1.7   Additional labs done previously: P68 864, folic acid 84.7 anti-intrinsic factor antibody: Negative, TSH 1.3, CMP: Normal   Differential for macrocytosis: 1.  B12 and folate deficiencies: On 5000 mcg of B12 sublingually. 2. Bone marrow disorders like MDS 3.  Underlying illnesses (he had an episode of food poisoning previously)   Based on improvement in his WBC count and the MCV, we decided not to do a bone marrow biopsy. Return to clinic in 1 year with labs and follow-up

## 2022-09-20 NOTE — Progress Notes (Signed)
Patient Care Team: Vernie Shanks, MD (Inactive) as PCP - General (Family Medicine)  DIAGNOSIS:  Encounter Diagnosis  Name Primary?   Macrocytosis Yes     CHIEF COMPLIANT: Follow-up to discuss results of blood work for macrocytic anemia and leukopenia.  INTERVAL HISTORY: Colin Calderon is a 66 with above-mentioned history of macrocytic anemia who is currently on 5000 mcg of B12.  He appears to be tolerating fairly well.  He had labs done today and is here today to discuss results of the test.  He was recently climbing a [place] in [place] and felt really great.  Apparently was up to 10,000 feet.   ALLERGIES:  is allergic to amoxicillin.  MEDICATIONS:  Current Outpatient Medications  Medication Sig Dispense Refill   Cyanocobalamin 5000 MCG/ML LIQD 1 tablet 1 day or 1 dose.     No current facility-administered medications for this visit.    PHYSICAL EXAMINATION: ECOG PERFORMANCE STATUS: 1 - Symptomatic but completely ambulatory  Vitals:   09/20/22 1116  BP: 124/75  Pulse: 71  Temp: 97.8 F (36.6 C)  SpO2: 100%   Filed Weights   09/20/22 1116  Weight: 177 lb 8 oz (80.5 kg)      LABORATORY DATA:  I have reviewed the data as listed    Latest Ref Rng & Units 06/22/2021    8:54 AM 05/08/2021    1:03 PM 03/08/2014    4:50 PM  CMP  Glucose 70 - 99 mg/dL 95  96  147   BUN 8 - 23 mg/dL '15  24  17   '$ Creatinine 0.61 - 1.24 mg/dL 0.98  1.41  1.21   Sodium 135 - 145 mmol/L 141  138  135   Potassium 3.5 - 5.1 mmol/L 4.9  3.9  4.0   Chloride 98 - 111 mmol/L 107  100  97   CO2 22 - 32 mmol/L '27  26  25   '$ Calcium 8.9 - 10.3 mg/dL 9.1  9.6  8.6   Total Protein 6.5 - 8.1 g/dL 6.3  7.6    Total Bilirubin 0.3 - 1.2 mg/dL 0.5  0.3    Alkaline Phos 38 - 126 U/L 41  48    AST 15 - 41 U/L 13  14    ALT 0 - 44 U/L 7  13      Lab Results  Component Value Date   WBC 4.9 09/20/2022   HGB 11.2 (L) 09/20/2022   HCT 33.2 (L) 09/20/2022   MCV 101.2 (H) 09/20/2022   PLT  294 09/20/2022   NEUTROABS 3.4 09/20/2022    ASSESSMENT & PLAN:  Macrocytosis Lab review: 04/05/2021: WBC 3.7, hemoglobin 12.1, platelets 227, MCV 99.7 07/14/2021: WBC 3.8, hemoglobin 11.3, platelets 231, MCV 99.3, MCH 34.5, RDW 15.5 11/15/2021: Hemoglobin 11.3, MCV 101.5, WBC 3.8, platelets 234, normal differential 02/07/2022: Hemoglobin 11.2, MCV 101, WBC 2.9, ANC 1.4, B12 and antiparietal cell antibody: Negative 03/14/2022: Hemoglobin 11.2, MCV 100, WBC 3.8, ANC 1.7 09/20/2022: Hemoglobin 11.2, MCV 101, WBC 4.9, ANC 3.4 (patient had recent sinus infection)   Additional labs done previously: Y09 983, folic acid 38.2 anti-intrinsic factor antibody: Negative, TSH 1.3, CMP: Normal   Differential for macrocytosis: 1.  B12 and folate deficiencies: On 5000 mcg of B12 sublingually. 2. Bone marrow disorders like MDS   There appears to be continued improvement of the WBC count.  B12 levels are pending from today. Return to clinic in 6 months with labs and follow-up  Orders Placed This Encounter  Procedures   CBC with Differential ([place] Only)    Standing Status:   Future    Standing Expiration Date:   09/21/2023   Vitamin B12    Standing Status:   Future    Standing Expiration Date:   09/21/2023   The patient has a good understanding of the overall plan. he agrees with it. he will call with any problems that may develop before the next visit here. Total time spent: 30 mins including face to face time and time spent for planning, charting and co-ordination of care   Harriette Ohara, MD 09/20/22

## 2022-09-25 ENCOUNTER — Telehealth: Payer: Self-pay | Admitting: Hematology and Oncology

## 2022-09-25 NOTE — Telephone Encounter (Signed)
Scheduled appointment per 1/11 los. Left voicemail.

## 2022-10-24 DIAGNOSIS — E538 Deficiency of other specified B group vitamins: Secondary | ICD-10-CM | POA: Diagnosis not present

## 2022-11-26 DIAGNOSIS — L814 Other melanin hyperpigmentation: Secondary | ICD-10-CM | POA: Diagnosis not present

## 2022-11-26 DIAGNOSIS — D1801 Hemangioma of skin and subcutaneous tissue: Secondary | ICD-10-CM | POA: Diagnosis not present

## 2022-11-26 DIAGNOSIS — L219 Seborrheic dermatitis, unspecified: Secondary | ICD-10-CM | POA: Diagnosis not present

## 2022-11-26 DIAGNOSIS — L821 Other seborrheic keratosis: Secondary | ICD-10-CM | POA: Diagnosis not present

## 2022-11-26 DIAGNOSIS — D229 Melanocytic nevi, unspecified: Secondary | ICD-10-CM | POA: Diagnosis not present

## 2022-11-26 DIAGNOSIS — L578 Other skin changes due to chronic exposure to nonionizing radiation: Secondary | ICD-10-CM | POA: Diagnosis not present

## 2022-11-26 DIAGNOSIS — L570 Actinic keratosis: Secondary | ICD-10-CM | POA: Diagnosis not present

## 2023-01-13 IMAGING — CR DG CHEST 2V
3 series · 3 of 3 positions shown · non-contrast
Comparison: 05/08/2021

CLINICAL DATA: 69-year-old male with pneumonia

EXAM:
CHEST - 2 VIEW

[w chest pa]
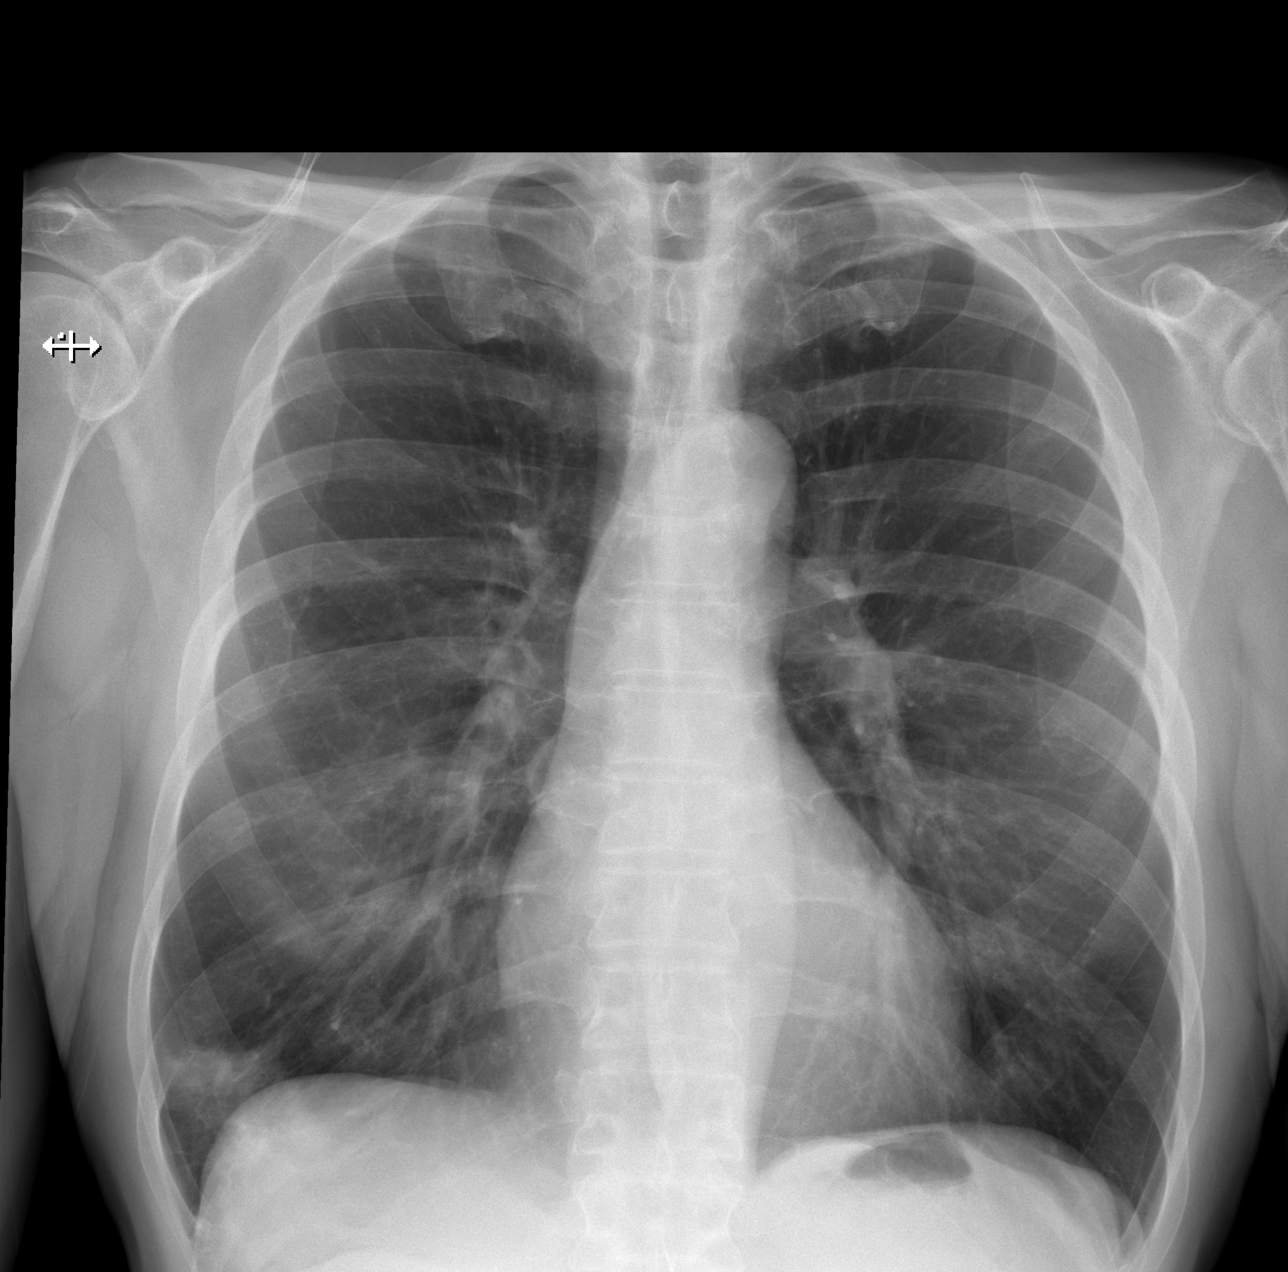

[w chest lat (1 of 2)]
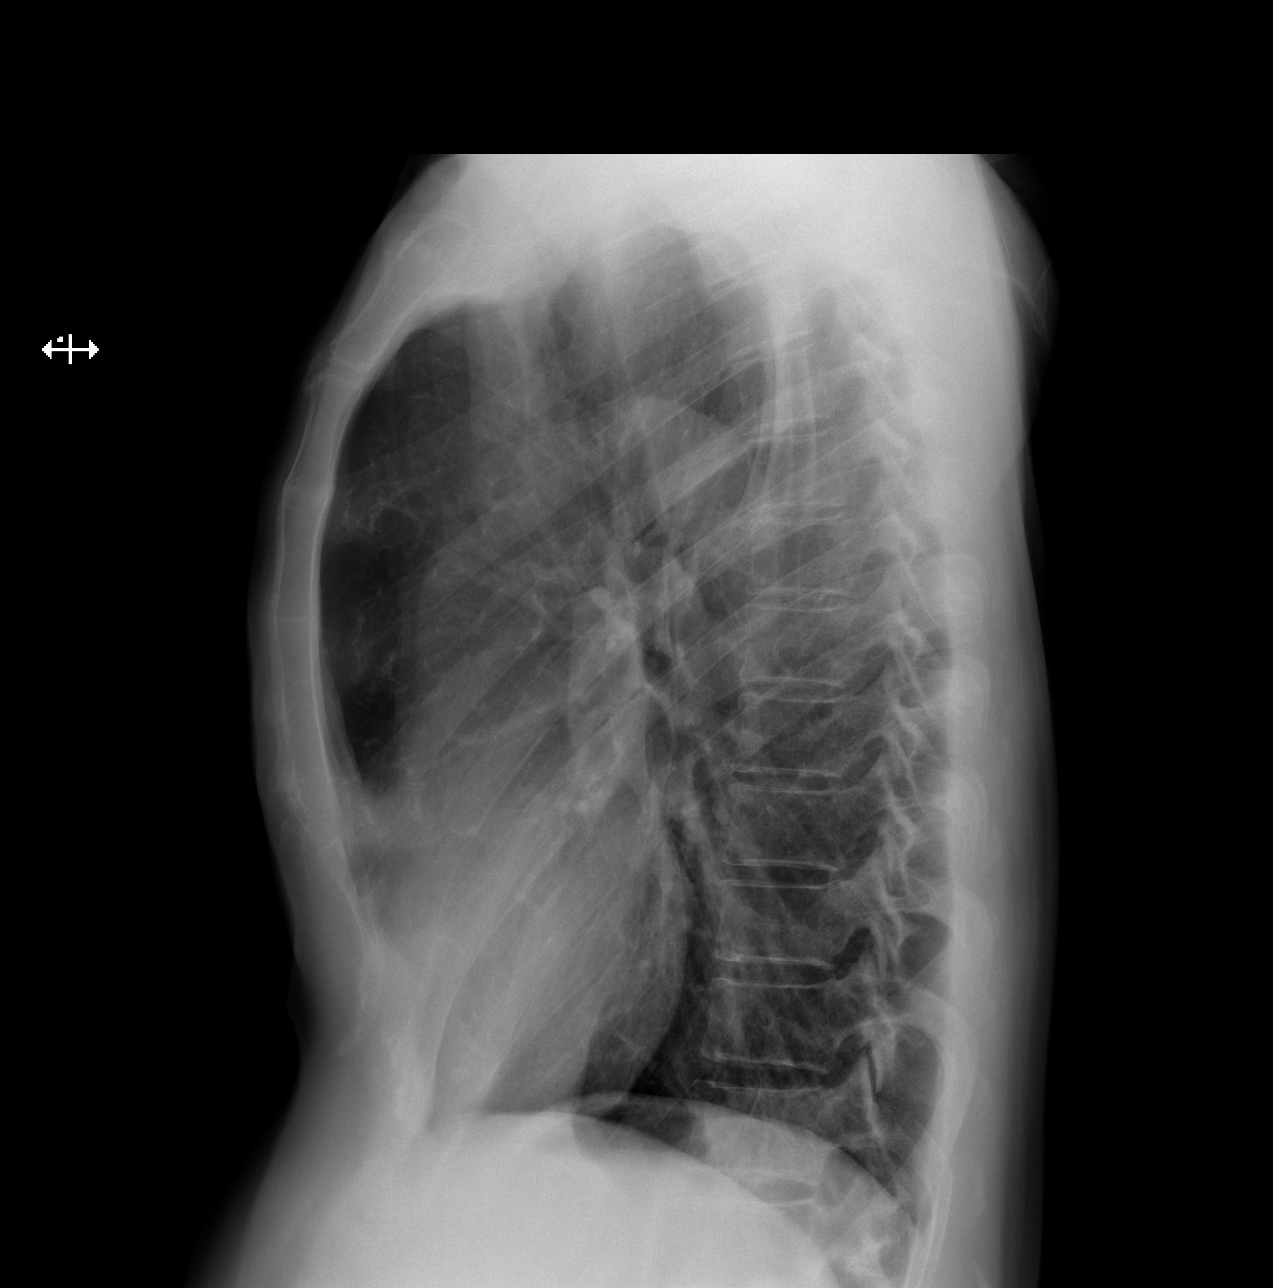

[w chest lat (2 of 2)]
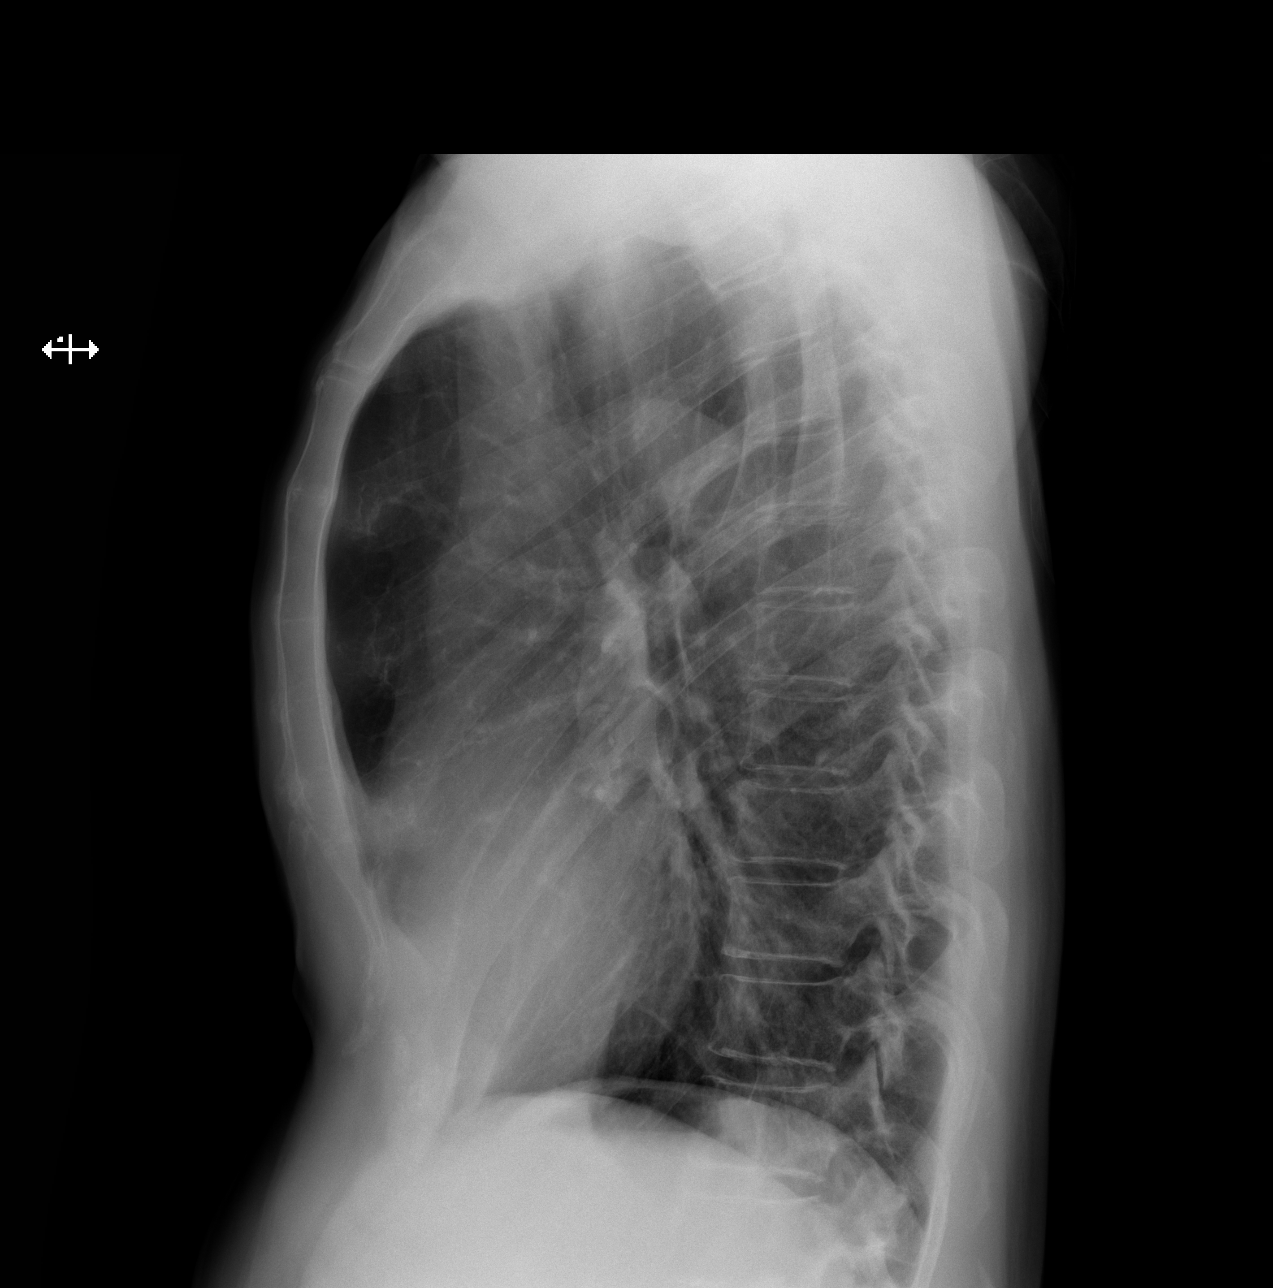

[3 of 3 positions shown; findings below may reference images not displayed]

FINDINGS: Cardiomediastinal silhouette unchanged in size and contour.

No interlobular septal thickening.

No pneumothorax.  No pleural effusion.

Improved aeration at the right lung base with partial resolution of
airspace opacity at the costophrenic angle. No new confluent
airspace disease.
IMPRESSION: Incomplete resolution of airspace disease at the right lung base.

## 2023-03-19 ENCOUNTER — Telehealth: Payer: Self-pay | Admitting: Hematology and Oncology

## 2023-03-19 NOTE — Telephone Encounter (Signed)
Patient has requested for future appointment to be cancelled; patient is aware of this cancellation

## 2023-03-19 NOTE — Telephone Encounter (Signed)
Patient request for next upcoming to be cancelled; patient is aware of this cancellation

## 2023-03-21 ENCOUNTER — Other Ambulatory Visit: Payer: PPO

## 2023-03-21 ENCOUNTER — Ambulatory Visit: Payer: PPO | Admitting: Hematology and Oncology

## 2023-04-25 DIAGNOSIS — E538 Deficiency of other specified B group vitamins: Secondary | ICD-10-CM | POA: Diagnosis not present

## 2023-04-25 DIAGNOSIS — D7589 Other specified diseases of blood and blood-forming organs: Secondary | ICD-10-CM | POA: Diagnosis not present

## 2023-04-25 DIAGNOSIS — Z125 Encounter for screening for malignant neoplasm of prostate: Secondary | ICD-10-CM | POA: Diagnosis not present

## 2023-04-25 DIAGNOSIS — Z8349 Family history of other endocrine, nutritional and metabolic diseases: Secondary | ICD-10-CM | POA: Diagnosis not present

## 2023-04-25 DIAGNOSIS — Z0000 Encounter for general adult medical examination without abnormal findings: Secondary | ICD-10-CM | POA: Diagnosis not present

## 2023-04-25 DIAGNOSIS — Z6824 Body mass index (BMI) 24.0-24.9, adult: Secondary | ICD-10-CM | POA: Diagnosis not present

## 2023-04-25 DIAGNOSIS — I2510 Atherosclerotic heart disease of native coronary artery without angina pectoris: Secondary | ICD-10-CM | POA: Diagnosis not present

## 2023-04-25 DIAGNOSIS — R7301 Impaired fasting glucose: Secondary | ICD-10-CM | POA: Diagnosis not present

## 2023-04-29 DIAGNOSIS — G4710 Hypersomnia, unspecified: Secondary | ICD-10-CM | POA: Diagnosis not present

## 2023-04-29 DIAGNOSIS — E538 Deficiency of other specified B group vitamins: Secondary | ICD-10-CM | POA: Diagnosis not present

## 2023-04-29 DIAGNOSIS — R7301 Impaired fasting glucose: Secondary | ICD-10-CM | POA: Diagnosis not present

## 2023-04-29 DIAGNOSIS — Z8349 Family history of other endocrine, nutritional and metabolic diseases: Secondary | ICD-10-CM | POA: Diagnosis not present

## 2023-04-29 DIAGNOSIS — D649 Anemia, unspecified: Secondary | ICD-10-CM | POA: Diagnosis not present

## 2023-04-29 DIAGNOSIS — H5213 Myopia, bilateral: Secondary | ICD-10-CM | POA: Diagnosis not present

## 2023-04-29 DIAGNOSIS — R5381 Other malaise: Secondary | ICD-10-CM | POA: Diagnosis not present

## 2023-04-29 DIAGNOSIS — N400 Enlarged prostate without lower urinary tract symptoms: Secondary | ICD-10-CM | POA: Diagnosis not present

## 2023-04-29 DIAGNOSIS — I700 Atherosclerosis of aorta: Secondary | ICD-10-CM | POA: Diagnosis not present

## 2023-04-29 DIAGNOSIS — H25813 Combined forms of age-related cataract, bilateral: Secondary | ICD-10-CM | POA: Diagnosis not present

## 2023-04-29 DIAGNOSIS — R200 Anesthesia of skin: Secondary | ICD-10-CM | POA: Diagnosis not present

## 2023-04-29 DIAGNOSIS — Z0000 Encounter for general adult medical examination without abnormal findings: Secondary | ICD-10-CM | POA: Diagnosis not present

## 2023-05-03 DIAGNOSIS — R5381 Other malaise: Secondary | ICD-10-CM | POA: Diagnosis not present

## 2023-05-21 DIAGNOSIS — E291 Testicular hypofunction: Secondary | ICD-10-CM | POA: Diagnosis not present

## 2023-06-24 DIAGNOSIS — G629 Polyneuropathy, unspecified: Secondary | ICD-10-CM | POA: Diagnosis not present

## 2023-06-24 DIAGNOSIS — G5603 Carpal tunnel syndrome, bilateral upper limbs: Secondary | ICD-10-CM | POA: Diagnosis not present

## 2023-06-24 DIAGNOSIS — M353 Polymyalgia rheumatica: Secondary | ICD-10-CM | POA: Diagnosis not present

## 2023-06-26 DIAGNOSIS — M353 Polymyalgia rheumatica: Secondary | ICD-10-CM | POA: Diagnosis not present

## 2023-06-26 DIAGNOSIS — G629 Polyneuropathy, unspecified: Secondary | ICD-10-CM | POA: Diagnosis not present

## 2023-06-26 DIAGNOSIS — G5603 Carpal tunnel syndrome, bilateral upper limbs: Secondary | ICD-10-CM | POA: Diagnosis not present

## 2023-06-30 ENCOUNTER — Encounter: Payer: Self-pay | Admitting: Hematology and Oncology

## 2023-07-01 ENCOUNTER — Telehealth: Payer: Self-pay

## 2023-07-01 ENCOUNTER — Ambulatory Visit (HOSPITAL_BASED_OUTPATIENT_CLINIC_OR_DEPARTMENT_OTHER)
Admission: RE | Admit: 2023-07-01 | Discharge: 2023-07-01 | Disposition: A | Discharge status: Home / Self Care | Payer: PPO | Source: Ambulatory Visit | Attending: Oncology | Admitting: Oncology

## 2023-07-01 ENCOUNTER — Inpatient Hospital Stay: Payer: PPO | Attending: Oncology | Admitting: Oncology

## 2023-07-01 ENCOUNTER — Other Ambulatory Visit: Payer: Self-pay

## 2023-07-01 ENCOUNTER — Inpatient Hospital Stay: Payer: PPO

## 2023-07-01 ENCOUNTER — Encounter: Payer: Self-pay | Admitting: Oncology

## 2023-07-01 VITALS — BP 147/74 | HR 79 | Temp 98.2°F | Resp 18 | Ht 71.0 in | Wt 179.3 lb

## 2023-07-01 DIAGNOSIS — M2550 Pain in unspecified joint: Secondary | ICD-10-CM | POA: Insufficient documentation

## 2023-07-01 DIAGNOSIS — M25541 Pain in joints of right hand: Secondary | ICD-10-CM | POA: Diagnosis not present

## 2023-07-01 DIAGNOSIS — D472 Monoclonal gammopathy: Secondary | ICD-10-CM | POA: Insufficient documentation

## 2023-07-01 DIAGNOSIS — D7589 Other specified diseases of blood and blood-forming organs: Secondary | ICD-10-CM | POA: Diagnosis not present

## 2023-07-01 DIAGNOSIS — E538 Deficiency of other specified B group vitamins: Secondary | ICD-10-CM | POA: Diagnosis not present

## 2023-07-01 DIAGNOSIS — M47812 Spondylosis without myelopathy or radiculopathy, cervical region: Secondary | ICD-10-CM | POA: Diagnosis not present

## 2023-07-01 DIAGNOSIS — J984 Other disorders of lung: Secondary | ICD-10-CM | POA: Diagnosis not present

## 2023-07-01 LAB — CBC WITH DIFFERENTIAL/PLATELET
Abs Immature Granulocytes: 0.02 10*3/uL (ref 0.00–0.07)
Basophils Absolute: 0 10*3/uL (ref 0.0–0.1)
Basophils Relative: 0 %
Eosinophils Absolute: 0 10*3/uL (ref 0.0–0.5)
Eosinophils Relative: 0 %
HCT: 31.7 % — ABNORMAL LOW (ref 39.0–52.0)
Hemoglobin: 10.7 g/dL — ABNORMAL LOW (ref 13.0–17.0)
Immature Granulocytes: 1 %
Lymphocytes Relative: 19 %
Lymphs Abs: 0.8 10*3/uL (ref 0.7–4.0)
MCH: 34 pg (ref 26.0–34.0)
MCHC: 33.8 g/dL (ref 30.0–36.0)
MCV: 100.6 fL — ABNORMAL HIGH (ref 80.0–100.0)
Monocytes Absolute: 0.2 10*3/uL (ref 0.1–1.0)
Monocytes Relative: 4 %
Neutro Abs: 3.4 10*3/uL (ref 1.7–7.7)
Neutrophils Relative %: 76 %
Platelets: 231 10*3/uL (ref 150–400)
RBC: 3.15 MIL/uL — ABNORMAL LOW (ref 4.22–5.81)
RDW: 13.7 % (ref 11.5–15.5)
WBC: 4.4 10*3/uL (ref 4.0–10.5)
nRBC: 0 % (ref 0.0–0.2)

## 2023-07-01 LAB — COMPREHENSIVE METABOLIC PANEL
ALT: 7 U/L (ref 0–44)
AST: 10 U/L — ABNORMAL LOW (ref 15–41)
Albumin: 4.7 g/dL (ref 3.5–5.0)
Alkaline Phosphatase: 40 U/L (ref 38–126)
Anion gap: 6 (ref 5–15)
BUN: 18 mg/dL (ref 8–23)
CO2: 29 mmol/L (ref 22–32)
Calcium: 9.9 mg/dL (ref 8.9–10.3)
Chloride: 103 mmol/L (ref 98–111)
Creatinine, Ser: 1.05 mg/dL (ref 0.61–1.24)
GFR, Estimated: >60 mL/min (ref >60)
Glucose, Bld: 96 mg/dL (ref 70–99)
Potassium: 4.4 mmol/L (ref 3.5–5.1)
Sodium: 138 mmol/L (ref 135–145)
Total Bilirubin: 0.9 mg/dL (ref 0.3–1.2)
Total Protein: 6.7 g/dL (ref 6.5–8.1)

## 2023-07-01 LAB — URIC ACID: Uric Acid, Serum: 5.1 mg/dL (ref 3.7–8.6)

## 2023-07-01 LAB — LACTATE DEHYDROGENASE: LDH: 118 U/L (ref 98–192)

## 2023-07-01 NOTE — Assessment & Plan Note (Signed)
Bilateral pain in hands and legs for the past 2 months. No clear cause identified yet. -We did obtain bone survey x-rays today to look for any lytic lesions, although this is less likely considering MGUS picture. -Consider Tylenol for pain management, with Advil as a secondary option if patient stays well-hydrated. -Consider follow-up with neurologist for potential nerve test.

## 2023-07-01 NOTE — Assessment & Plan Note (Addendum)
-   Please review HPI above for additional details and timeline of events.  M spike incidentally noted on workup at neurologist office.  Small amount of 0.25 g/dL.  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  No evidence of anemia, renal dysfunction or hypercalcemia. -We will pursue additional workup including quantitative immunoglobulins, serum free light chains, 24-hour urine protein electrophoresis/immunoelectrophoresis, beta-2 microglobulin to complete workup. -Discussed the nature of MGUS and small chance of progression to smoldering myeloma or active multiple myeloma.  Discussed need for continued surveillance.  Answered all the questions that the patient, his wife and his daughter had to the best of my ability and provided reassurance. -Will follow-up on x-ray skeletal survey results and inform patient. -Phone call visit in 2 to 3 weeks to discuss results of above-mentioned workup. -Plan for reevaluation in approximately 3 months.  Patient preferred to follow-up with Dr. Pamelia Hoit for this.  Our clinic will arrange appropriate appointments.

## 2023-07-01 NOTE — Telephone Encounter (Signed)
Called Colin Calderon and spoke with her about her husband's upcoming appt with Dr. Arlana Pouch.  I informed her that Dr. Pamelia Hoit is out of the country until early November and if he needed to be seen sooner, it would be best to get him in with another physician. They agreed with this plan and an appointment was made with Dr. Arlana Pouch at [facility] today.  They will be here at 2 pm and Dr. Arlana Pouch is aware. Lorayne Marek, RN

## 2023-07-01 NOTE — Progress Notes (Signed)
[facility]  HEMATOLOGY CLINIC NOTE    PATIENT NAME: Colin Calderon   MR#: [id number] DOB: [birth year]  DATE OF SERVICE: 07/01/2023   Patient Care Team: Ileana Ladd, MD (Inactive) as PCP - General (Family Medicine)   REASON FOR VISIT/ CHIEF COMPLAINT:  M spike, for further evaluation  HISTORY OF PRESENT ILLNESS:  Colin Calderon is a 72 y.o. gentleman with a past medical history of macrocytic anemia, currently on vitamin B12 replacement, was referred to our clinic from his neurologist office after SPEP showed evidence of M spike.  Discussed the use of AI scribe software for clinical note transcription with the patient, who gave verbal consent to proceed.   He usually follows up with Dr. Pamelia Hoit in our office for his macrocytosis, vitamin B12 deficiency.  He was last seen in January 2024.  His hemoglobin has declined slowly since 2010. He had a diagnosis of B12 deficiency with a B12 level of 135 in 2017 and since then he was on 1000 mcg B12 supplementation. In spite of that his macrocytosis was not fully corrected. His MCV always ranges between 100-102. He had seen a new primary care physician who referred him to our clinic for discussion regarding his mild macrocytic anemia. On his initial consultation with Dr. Pamelia Hoit, B12 was switched to 5000 mcg dose sublingually.  White count, hemoglobin and MCV improved with this dosing.  As part of bilateral carpal tunnel syndrome workup, his neurologist obtained workup on 06/26/2023 including ESR, CRP, Lyme serology, folate, SPEP, IFE.  SPEP showed 0.25 g/dL of M spike.  It was monoclonal IgG lambda type on IFE.  Hence he was referred to our clinic for further evaluation.  Since Dr. Pamelia Hoit is out of office for the next couple of weeks, patient wanted to see someone sooner and hence he presented to our office today.  He complains of generalized pain in all extremities for the past two months. The pain is described as  encompassing the entire palm, arms up to the shoulders, and legs. The pain is severe enough to wake the patient from sleep and has led to changes in sleeping position to minimize discomfort. The patient denies any recent trauma or injury. The patient has not been taking any pain medication for this issue. The patient's pain began around the same time as his last physical in August.   He denies fever, cough, diarrhea, or other infectious symptoms.  He denies epistaxis, bloody stool, melena, hematuria, bruising or other bleeding symptoms. He also denies unintentional weight loss, night sweats or other constitutional symptoms.  MEDICAL HISTORY History reviewed. No pertinent past medical history.   SURGICAL HISTORY Past Surgical History:  Procedure Laterality Date   HERNIA REPAIR       SOCIAL HISTORY: He reports that he has never smoked. He does not have any smokeless tobacco history on file. He reports current alcohol use. He reports that he does not use drugs. Social History   Socioeconomic History   Marital status: Married    Spouse name: Not on file   Number of children: Not on file   Years of education: Not on file   Highest education level: Not on file  Occupational History   Not on file  Tobacco Use   Smoking status: Never   Smokeless tobacco: Not on file  Substance and Sexual Activity   Alcohol use: Yes   Drug use: No   Sexual activity: Not on file  Other Topics Concern  Not on file  Social History Narrative   Not on file   Social Determinants of Health   Financial Resource Strain: Low Risk  (07/01/2023)   Overall Financial Resource Strain (CARDIA)    Difficulty of Paying Living Expenses: Not hard at all  Food Insecurity: No Food Insecurity (07/01/2023)   Hunger Vital Sign    Worried About Running Out of Food in the Last Year: Never true    Ran Out of Food in the Last Year: Never true  Transportation Needs: No Transportation Needs (07/01/2023)   PRAPARE -  Administrator, Civil Service (Medical): No    Lack of Transportation (Non-Medical): No  Physical Activity: Sufficiently Active (07/01/2023)   Exercise Vital Sign    Days of Exercise per Week: 7 days    Minutes of Exercise per Session: 40 min  Stress: No Stress Concern Present (07/01/2023)   [employer] of Occupational Health - Occupational Stress Questionnaire    Feeling of Stress : Not at all  Social Connections: Socially Integrated (07/01/2023)   Social Connection and Isolation Panel [NHANES]    Frequency of Communication with Friends and Family: Not on file    Frequency of Social Gatherings with Friends and Family: Three times a week    Attends Religious Services: More than 4 times per year    Active Member of Clubs or Organizations: Yes    Attends Banker Meetings: More than 4 times per year    Marital Status: Married  Catering manager Violence: Not At Risk (07/01/2023)   Humiliation, Afraid, Rape, and Kick questionnaire    Fear of Current or Ex-Partner: No    Emotionally Abused: No    Physically Abused: No    Sexually Abused: No    FAMILY HISTORY: His family history is not on file.  CURRENT MEDICATIONS   Current Outpatient Medications  Medication Instructions   Cobalamin Combinations (B-12) [phone] MCG SUBL 1 tablet, Oral, Daily   Cyanocobalamin 5000 MCG/ML LIQD 1 tablet, 1 Day/Dose     ALLERGIES  He is allergic to amoxicillin.  REVIEW OF SYSTEMS:  Review of Systems - Oncology   Rest of the pertinent review of systems is unremarkable except as mentioned above in HPI.  PHYSICAL EXAMINATION:  ECOG PERFORMANCE STATUS: 1 - Symptomatic but completely ambulatory  Vitals:   07/01/23 1414  BP: (!) 147/74  Pulse: 79  Resp: 18  Temp: 98.2 F (36.8 C)  SpO2: 99%   Filed Weights   07/01/23 1414  Weight: 179 lb 4.8 oz (81.3 kg)    Physical Exam Constitutional:      General: He is not in acute distress.    Appearance: Normal  appearance.  HENT:     Head: Normocephalic and atraumatic.  Eyes:     General: No scleral icterus.    Extraocular Movements: Extraocular movements intact.     Conjunctiva/sclera: Conjunctivae normal.  Cardiovascular:     Rate and Rhythm: Normal rate and regular rhythm.     Pulses: Normal pulses.     Heart sounds: Normal heart sounds.  Pulmonary:     Effort: Pulmonary effort is normal.     Breath sounds: Normal breath sounds.  Abdominal:     General: There is no distension.     Palpations: Abdomen is soft.     Tenderness: There is no abdominal tenderness.  Musculoskeletal:     Right lower leg: No edema.     Left lower leg: No edema.  Lymphadenopathy:  Cervical: No cervical adenopathy.  Skin:    Findings: No rash.  Neurological:     General: No focal deficit present.     Mental Status: He is alert and oriented to person, place, and time.  Psychiatric:        Mood and Affect: Mood normal.        Behavior: Behavior normal.        Thought Content: Thought content normal.     LABORATORY DATA:   I have reviewed the data as listed.  Results for orders placed or performed in visit on 07/01/23  Uric acid  Result Value Ref Range   Uric Acid, Serum 5.1 3.7 - 8.6 mg/dL  Lactate dehydrogenase  Result Value Ref Range   LDH 118 98 - 192 U/L  Comprehensive metabolic panel  Result Value Ref Range   Sodium 138 135 - 145 mmol/L   Potassium 4.4 3.5 - 5.1 mmol/L   Chloride 103 98 - 111 mmol/L   CO2 29 22 - 32 mmol/L   Glucose, Bld 96 70 - 99 mg/dL   BUN 18 8 - 23 mg/dL   Creatinine, Ser 1.61 0.61 - 1.24 mg/dL   Calcium 9.9 8.9 - 09.6 mg/dL   Total Protein 6.7 6.5 - 8.1 g/dL   Albumin 4.7 3.5 - 5.0 g/dL   AST 10 (L) 15 - 41 U/L   ALT 7 0 - 44 U/L   Alkaline Phosphatase 40 38 - 126 U/L   Total Bilirubin 0.9 0.3 - 1.2 mg/dL   GFR, Estimated >04 >54 mL/min   Anion gap 6 5 - 15  CBC with Differential/Platelet  Result Value Ref Range   WBC 4.4 4.0 - 10.5 K/uL   RBC 3.15 (L)  4.22 - 5.81 MIL/uL   Hemoglobin 10.7 (L) 13.0 - 17.0 g/dL   HCT 09.8 (L) 11.9 - 14.7 %   MCV 100.6 (H) 80.0 - 100.0 fL   MCH 34.0 26.0 - 34.0 pg   MCHC 33.8 30.0 - 36.0 g/dL   RDW 82.9 56.2 - 13.0 %   Platelets 231 150 - 400 K/uL   nRBC 0.0 0.0 - 0.2 %   Neutrophils Relative % 76 %   Neutro Abs 3.4 1.7 - 7.7 K/uL   Lymphocytes Relative 19 %   Lymphs Abs 0.8 0.7 - 4.0 K/uL   Monocytes Relative 4 %   Monocytes Absolute 0.2 0.1 - 1.0 K/uL   Eosinophils Relative 0 %   Eosinophils Absolute 0.0 0.0 - 0.5 K/uL   Basophils Relative 0 %   Basophils Absolute 0.0 0.0 - 0.1 K/uL   Immature Granulocytes 1 %   Abs Immature Granulocytes 0.02 0.00 - 0.07 K/uL     RADIOGRAPHIC STUDIES:  X-ray bone survey performed today.  Results pending.  ASSESSMENT & PLAN:  72 y.o. gentleman with a past medical history of macrocytic anemia, currently on vitamin B12 replacement, was referred to our clinic from his neurologist office after SPEP showed evidence of M spike.  Monoclonal gammopathy - Please review HPI above for additional details and timeline of events.  M spike incidentally noted on workup at neurologist office.  Small amount of 0.25 g/dL.  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  No evidence of anemia, renal dysfunction or hypercalcemia. -We will pursue additional workup including quantitative immunoglobulins, serum free light chains, 24-hour urine protein electrophoresis/immunoelectrophoresis, beta-2 microglobulin to complete workup. -Discussed the nature of MGUS  and small chance of progression to smoldering myeloma or active multiple myeloma.  Discussed need for continued surveillance.  Answered all the questions that the patient, his wife and his daughter had to the best of my ability and provided reassurance. -Will follow-up on x-ray skeletal survey results and inform patient. -Phone call visit in 2 to 3 weeks to discuss results of  above-mentioned workup. -Plan for reevaluation in approximately 3 months.  Patient preferred to follow-up with Dr. Pamelia Hoit for this.  Our clinic will arrange appropriate appointments.  Macrocytosis Stable compared to prior.  He was advised to continue vitamin B12 5000 mcg sublingually daily. -If progressive anemia is noted, he would benefit from bone marrow biopsy, especially given recent finding of monoclonal gammopathy (MGUS).  Diffuse arthralgia Bilateral pain in hands and legs for the past 2 months. No clear cause identified yet. -We did obtain bone survey x-rays today to look for any lytic lesions, although this is less likely considering MGUS picture. -Consider Tylenol for pain management, with Advil as a secondary option if patient stays well-hydrated. -Consider follow-up with neurologist for potential nerve test.    Orders Placed This Encounter  Procedures   DG Bone Survey Met    Standing Status:   Future    Number of Occurrences:   1    Standing Expiration Date:   06/30/2024    Order Specific Question:   Reason for exam:    Answer:   Newly diagnosed monoclonal gammopathy. Please evaluate for any lytic lesions.    Order Specific Question:   Preferred imaging location?    Answer:   [facility]   CBC with Differential/Platelet    Standing Status:   Future    Number of Occurrences:   1    Standing Expiration Date:   06/30/2024   Comprehensive metabolic panel    Standing Status:   Future    Number of Occurrences:   1    Standing Expiration Date:   06/30/2024   Lactate dehydrogenase    Standing Status:   Future    Number of Occurrences:   1    Standing Expiration Date:   06/30/2024   Multiple Myeloma Panel (SPEP&IFE w/QIG)    Standing Status:   Future    Number of Occurrences:   1    Standing Expiration Date:   06/30/2024   Kappa/lambda light chains    Standing Status:   Future    Number of Occurrences:   1    Standing Expiration Date:   06/30/2024   24 Hr Ur  Kappa/Lambda/Light Chains, Free w Ratio    Standing Status:   Future    Standing Expiration Date:   06/30/2024   Uric acid    Standing Status:   Future    Number of Occurrences:   1    Standing Expiration Date:   06/30/2024   Beta 2 microglobulin, serum    Standing Status:   Future    Number of Occurrences:   1    Standing Expiration Date:   06/30/2024    The total time spent in the appointment was 45 minutes encounter with the patient, including review of chart and results of various tests, discussion about plan of care and coordination of care plan.  I reviewed lab results and outside records for this visit and discussed relevant results with the patient. Diagnosis, plan of care and treatment options were also discussed in detail with the patient. Opportunity provided to ask questions and answers  provided to his apparent satisfaction. Provided instructions to call our clinic with any problems, questions or concerns prior to return visit. I recommended to continue follow-up with PCP and sub-specialists. He verbalized understanding and agreed with the plan. No barriers to learning was detected.   Future Appointments  Date Time Provider Department Center  07/23/2023  3:20 PM Meryl Crutch, MD [facility] None  08/27/2023  8:30 AM Penumalli, Glenford Bayley, MD [facility] None     Meryl Crutch, MD  07/01/2023 4:31 PM  [facility] AT [facility] [address] [place] [address] Dept: [phone] Dept Fax: [phone]    This document was completed utilizing speech recognition software. Grammatical errors, random word insertions, pronoun errors, and incomplete sentences are an occasional consequence of this system due to software limitations, ambient noise, and hardware issues. Any formal questions or concerns about the content, text or information contained within the body of this dictation should be directly addressed to the provider for clarification.

## 2023-07-01 NOTE — Assessment & Plan Note (Addendum)
Stable compared to prior.  He was advised to continue vitamin B12 5000 mcg sublingually daily. -If progressive anemia is noted, he would benefit from bone marrow biopsy, especially given recent finding of monoclonal gammopathy (MGUS).

## 2023-07-02 LAB — KAPPA/LAMBDA LIGHT CHAINS
Kappa free light chain: 593.4 mg/L — ABNORMAL HIGH (ref 3.3–19.4)
Kappa, lambda light chain ratio: 185.44 — ABNORMAL HIGH (ref 0.26–1.65)
Lambda free light chains: 3.2 mg/L — ABNORMAL LOW (ref 5.7–26.3)

## 2023-07-02 LAB — BETA 2 MICROGLOBULIN, SERUM: Beta-2 Microglobulin: 3.4 mg/L — ABNORMAL HIGH (ref 0.6–2.4)

## 2023-07-03 DIAGNOSIS — Z03818 Encounter for observation for suspected exposure to other biological agents ruled out: Secondary | ICD-10-CM | POA: Diagnosis not present

## 2023-07-03 DIAGNOSIS — Z6824 Body mass index (BMI) 24.0-24.9, adult: Secondary | ICD-10-CM | POA: Diagnosis not present

## 2023-07-03 DIAGNOSIS — J069 Acute upper respiratory infection, unspecified: Secondary | ICD-10-CM | POA: Diagnosis not present

## 2023-07-04 ENCOUNTER — Encounter: Payer: Self-pay | Admitting: Oncology

## 2023-07-04 LAB — MULTIPLE MYELOMA PANEL, SERUM
Albumin SerPl Elph-Mcnc: 3.8 g/dL (ref 2.9–4.4)
Albumin/Glob SerPl: 2 — ABNORMAL HIGH (ref 0.7–1.7)
Alpha 1: 0.3 g/dL (ref 0.0–0.4)
Alpha2 Glob SerPl Elph-Mcnc: 0.6 g/dL (ref 0.4–1.0)
B-Globulin SerPl Elph-Mcnc: 0.7 g/dL (ref 0.7–1.3)
Gamma Glob SerPl Elph-Mcnc: 0.4 g/dL (ref 0.4–1.8)
Globulin, Total: 2 g/dL — ABNORMAL LOW (ref 2.2–3.9)
IgA: 7 mg/dL — ABNORMAL LOW (ref 61–437)
IgG (Immunoglobin G), Serum: 541 mg/dL — ABNORMAL LOW (ref 603–1613)
IgM (Immunoglobulin M), Srm: <5 mg/dL — ABNORMAL LOW (ref 20–172)
M Protein SerPl Elph-Mcnc: 0.2 g/dL — ABNORMAL HIGH
Total Protein ELP: 5.8 g/dL — ABNORMAL LOW (ref 6.0–8.5)

## 2023-07-06 ENCOUNTER — Emergency Department (HOSPITAL_BASED_OUTPATIENT_CLINIC_OR_DEPARTMENT_OTHER): Payer: PPO | Admitting: Radiology

## 2023-07-06 ENCOUNTER — Emergency Department (HOSPITAL_BASED_OUTPATIENT_CLINIC_OR_DEPARTMENT_OTHER)
Admission: EM | Admit: 2023-07-06 | Discharge: 2023-07-06 | Disposition: A | Discharge status: Home / Self Care | Payer: PPO | Attending: Emergency Medicine | Admitting: Emergency Medicine

## 2023-07-06 ENCOUNTER — Encounter (HOSPITAL_BASED_OUTPATIENT_CLINIC_OR_DEPARTMENT_OTHER): Payer: Self-pay

## 2023-07-06 DIAGNOSIS — D72829 Elevated white blood cell count, unspecified: Secondary | ICD-10-CM | POA: Diagnosis not present

## 2023-07-06 DIAGNOSIS — J168 Pneumonia due to other specified infectious organisms: Secondary | ICD-10-CM | POA: Insufficient documentation

## 2023-07-06 DIAGNOSIS — R918 Other nonspecific abnormal finding of lung field: Secondary | ICD-10-CM | POA: Diagnosis not present

## 2023-07-06 DIAGNOSIS — R0789 Other chest pain: Secondary | ICD-10-CM | POA: Diagnosis not present

## 2023-07-06 DIAGNOSIS — J984 Other disorders of lung: Secondary | ICD-10-CM | POA: Diagnosis not present

## 2023-07-06 DIAGNOSIS — J189 Pneumonia, unspecified organism: Secondary | ICD-10-CM

## 2023-07-06 DIAGNOSIS — R079 Chest pain, unspecified: Secondary | ICD-10-CM | POA: Diagnosis not present

## 2023-07-06 DIAGNOSIS — J22 Unspecified acute lower respiratory infection: Secondary | ICD-10-CM | POA: Diagnosis not present

## 2023-07-06 DIAGNOSIS — R059 Cough, unspecified: Secondary | ICD-10-CM | POA: Diagnosis not present

## 2023-07-06 LAB — CBC
HCT: 30.1 % — ABNORMAL LOW (ref 39.0–52.0)
Hemoglobin: 10 g/dL — ABNORMAL LOW (ref 13.0–17.0)
MCH: 33.9 pg (ref 26.0–34.0)
MCHC: 33.2 g/dL (ref 30.0–36.0)
MCV: 102 fL — ABNORMAL HIGH (ref 80.0–100.0)
Platelets: 336 10*3/uL (ref 150–400)
RBC: 2.95 MIL/uL — ABNORMAL LOW (ref 4.22–5.81)
RDW: 13.6 % (ref 11.5–15.5)
WBC: 4.3 10*3/uL (ref 4.0–10.5)
nRBC: 0 % (ref 0.0–0.2)

## 2023-07-06 LAB — BASIC METABOLIC PANEL
Anion gap: 10 (ref 5–15)
BUN: 19 mg/dL (ref 8–23)
CO2: 26 mmol/L (ref 22–32)
Calcium: 9.4 mg/dL (ref 8.9–10.3)
Chloride: 102 mmol/L (ref 98–111)
Creatinine, Ser: 1.11 mg/dL (ref 0.61–1.24)
GFR, Estimated: >60 mL/min (ref >60)
Glucose, Bld: 96 mg/dL (ref 70–99)
Potassium: 4.3 mmol/L (ref 3.5–5.1)
Sodium: 138 mmol/L (ref 135–145)

## 2023-07-06 LAB — TROPONIN I (HIGH SENSITIVITY)
Troponin I (High Sensitivity): 3 ng/L (ref <18)
Troponin I (High Sensitivity): 6 ng/L (ref <18)

## 2023-07-06 MED ORDER — ACETAMINOPHEN 325 MG PO TABS
650.0000 mg | ORAL_TABLET | Freq: Once | ORAL | Status: AC
  Administered 2023-07-06: 650 mg via ORAL
  Filled 2023-07-06: qty 2

## 2023-07-06 MED ORDER — AZITHROMYCIN 250 MG PO TABS: 250.0000 mg | ORAL_TABLET | Freq: Every day | ORAL | 0 refills | Status: DC

## 2023-07-06 MED ORDER — BENZONATATE 100 MG PO CAPS: 100.0000 mg | ORAL_CAPSULE | Freq: Three times a day (TID) | ORAL | 0 refills | Status: DC

## 2023-07-06 MED ORDER — AZITHROMYCIN 250 MG PO TABS
500.0000 mg | ORAL_TABLET | Freq: Once | ORAL | Status: AC
  Administered 2023-07-06: 500 mg via ORAL
  Filled 2023-07-06: qty 2

## 2023-07-06 MED ORDER — SODIUM CHLORIDE 0.9 % IV SOLN
1.0000 g | Freq: Once | INTRAVENOUS | Status: AC
  Administered 2023-07-06: 1 g via INTRAVENOUS
  Filled 2023-07-06: qty 10

## 2023-07-06 NOTE — ED Provider Notes (Signed)
[place] EMERGENCY DEPARTMENT AT [facility] Provider Note   CSN: [id number] Arrival date & time: 07/06/23  1230    History  Chief Complaint  Patient presents with   Chest Pain    Colin Calderon is a 72 y.o. male history of microcytic anemia currently being worked up for [employer] after SPEP by neurology and heme-onc here for evaluation of feeling unwell.  Patient states he has had 1 week of cough, congestion, left-sided lower chest pain as well as fever.  Has been taking antipyretics at home, none today.  He was seen by PCP on 06/03/2023.  Had negative COVID, flu, RSV, thought likely had viral illness.  He continues to have fevers, cough, congestion.  He was seen by urgent care at [facility] today who obtained a chest x-ray which showed pneumonia he subsequently was sent here for further evaluation.  No nausea, vomiting, back pain, abdominal pain, unilateral weakness  HPI     Home Medications Prior to Admission medications   Medication Sig Start Date End Date Taking? Authorizing Provider  azithromycin (ZITHROMAX) 250 MG tablet Take 1 tablet (250 mg total) by mouth daily. Take first 2 tablets together, then 1 every day until finished. 07/06/23  Yes Salif Tay A, PA-C  benzonatate (TESSALON) 100 MG capsule Take 1 capsule (100 mg total) by mouth every 8 (eight) hours. 07/06/23  Yes Jaycee Mckellips A, PA-C  Cobalamin Combinations (B-12) [phone] MCG SUBL Take 1 tablet by mouth daily.    [provider]  Cyanocobalamin 5000 MCG/ML LIQD 1 tablet 1 day or 1 dose.    [provider]      Allergies    Amoxicillin    Review of Systems   Review of Systems  Constitutional:  Positive for fatigue and fever.  HENT:  Positive for congestion and rhinorrhea.   Respiratory:  Positive for cough. Negative for apnea, choking, chest tightness, shortness of breath, wheezing and stridor.   Cardiovascular:  Positive for chest pain (left).  Gastrointestinal: Negative.    Genitourinary: Negative.   Musculoskeletal: Negative.   Skin: Negative.   Neurological:  Positive for weakness (generalized).  All other systems reviewed and are negative.   Physical Exam Updated Vital Signs BP (!) 119/57 (BP Location: Right Arm)   Pulse 99   Temp 99.3 F (37.4 C) (Oral)   Resp 19   SpO2 95%  Physical Exam Vitals and nursing note reviewed.  Constitutional:      General: He is not in acute distress.    Appearance: He is well-developed. He is not ill-appearing, toxic-appearing or diaphoretic.  HENT:     Head: Atraumatic.  Eyes:     Pupils: Pupils are equal, round, and reactive to light.  Cardiovascular:     Rate and Rhythm: Normal rate and regular rhythm.     Pulses:          Radial pulses are 2+ on the right side and 2+ on the left side.       Dorsalis pedis pulses are 2+ on the right side.     Heart sounds: Normal heart sounds.  Pulmonary:     Effort: Pulmonary effort is normal. No respiratory distress.     Breath sounds: Rhonchi present.     Comments: Rhonchi left lower lobe, speaks in full sentences without difficulty Abdominal:     General: Bowel sounds are normal. There is no distension or abdominal bruit.     Palpations: Abdomen is soft. There is no mass.  Tenderness: There is no abdominal tenderness. There is no guarding or rebound.  Musculoskeletal:        General: Normal range of motion.     Cervical back: Normal range of motion and neck supple.     Right lower leg: No tenderness. No edema.     Left lower leg: No tenderness. No edema.  Skin:    General: Skin is warm and dry.     Capillary Refill: Capillary refill takes less than 2 seconds.  Neurological:     General: No focal deficit present.     Mental Status: He is alert and oriented to person, place, and time.    ED Results / Procedures / Treatments   Labs (all labs ordered are listed, but only abnormal results are displayed) Labs Reviewed  CBC - Abnormal; Notable for the  following components:      Result Value   RBC 2.95 (*)    Hemoglobin 10.0 (*)    HCT 30.1 (*)    MCV 102.0 (*)    All other components within normal limits  BASIC METABOLIC PANEL  TROPONIN I (HIGH SENSITIVITY)  TROPONIN I (HIGH SENSITIVITY)    EKG None  Radiology DG Chest 2 View  Result Date: 07/06/2023 CLINICAL DATA:  Chest pain EXAM: CHEST - 2 VIEW COMPARISON:  08/24/2021 FINDINGS: Normal cardiac silhouette. There is patchy airspace disease in the lingula and LEFT lower lobe. Potential focus of airspace disease in the RIGHT lower lobe which is less involved. No pneumothorax. No aggressive osseous lesion. IMPRESSION: Concern for multifocal bilateral pneumonia. Followup PA and lateral chest X-ray is recommended in 3-4 weeks following trial of antibiotic therapy to ensure resolution and exclude underlying malignancy. Electronically Signed   By: Genevive Bi M.D.   On: 07/06/2023 14:03    Procedures Procedures    Medications Ordered in ED Medications  acetaminophen (TYLENOL) tablet 650 mg (650 mg Oral Given 07/06/23 1453)  cefTRIAXone (ROCEPHIN) 1 g in sodium chloride 0.9 % 100 mL IVPB (0 g Intravenous Stopped 07/06/23 1530)  azithromycin (ZITHROMAX) tablet 500 mg (500 mg Oral Given 07/06/23 1454)   ED Course/ Medical Decision Making/ A&P   72 year old here for evaluation URI symptoms.  Seen by Deboraha Sprang PCP 3 days ago negative COVID, flu, RSV.  Continued of symptoms was seen today who ordered a chest x-ray which showed pneumonia, subsequently sent here.  Patient states he has some chest tightness, cough, chills.  No history of PE or DVT.  No clinical evidence of VTE on exam.  Some congestion/rhinorrhea.  On arrival patient is nonseptic appearing.  No tachycardia, hypoxia.  He does have some rhonchi to his left lower lobe.  Abdomen soft.  He appears clinically well-hydrated.  No antipyretics today per family in room.  Will plan on labs, imaging and reassess  Labs and imaging  personally viewed and interpreted:  CBC without leukocytosis Metabolic panel without significant abnormality Troponin 3, delta 6 Chest x-ray multifocal pneumonia EKG without ischemic changes  Patient reassessed. Getting IV Abx.  Patient reassessed.  Ambulatory without hypoxia.  States he feels okay to go home.  Will write for oral antibiotics, cough medication.  Will have him follow-up outpatient, return for new or worsening symptoms.  At this time I have low suspicion for acute ACS, PE, dissection, pneumothorax, sepsis.  Discussed with attending, Dr. Wilkie Aye who is agreeable with above plan and disposition  The patient has been appropriately medically screened and/or stabilized in the ED. I have low suspicion  for any other emergent medical condition which would require further screening, evaluation or treatment in the ED or require inpatient management.  Patient is hemodynamically stable and in no acute distress.  Patient able to ambulate in department prior to ED.  Evaluation does not show acute pathology that would require ongoing or additional emergent interventions while in the emergency department or further inpatient treatment.  I have discussed the diagnosis with the patient and answered all questions.  Pain is been managed while in the emergency department and patient has no further complaints prior to discharge.  Patient is comfortable with plan discussed in room and is stable for discharge at this time.  I have discussed strict return precautions for returning to the emergency department.  Patient was encouraged to follow-up with PCP/specialist refer to at discharge.                                Medical Decision Making Amount and/or Complexity of Data Reviewed Independent Historian: spouse External Data Reviewed: labs, radiology, ECG and notes. Labs: ordered. Decision-making details documented in ED Course. Radiology: ordered and independent interpretation performed.  Decision-making details documented in ED Course. ECG/medicine tests: ordered and independent interpretation performed. Decision-making details documented in ED Course.  Risk OTC drugs. Prescription drug management. Parenteral controlled substances. Decision regarding hospitalization. Diagnosis or treatment significantly limited by social determinants of health.           Final Clinical Impression(s) / ED Diagnoses Final diagnoses:  Pneumonia of both lungs due to infectious organism, unspecified part of lung    Rx / DC Orders ED Discharge Orders          Ordered    azithromycin (ZITHROMAX) 250 MG tablet  Daily        07/06/23 1553    benzonatate (TESSALON) 100 MG capsule  Every 8 hours        07/06/23 1553              Santiago Graf A, PA-C 07/06/23 1554    Horton, Clabe Seal, DO 07/07/23 1308

## 2023-07-06 NOTE — Discharge Instructions (Addendum)
It was a pleasure taking care of you here in the emergency department today  Your chest x-ray showed pneumonia  We gave you IV antibiotics we have started you on oral antibiotics  Take as prescribed  Make sure to follow-up with your primary care provider early next week  Return for new or worsening symptoms

## 2023-07-06 NOTE — ED Notes (Signed)
Patient given discharge instructions. Questions were answered. Patient verbalized understanding of discharge instructions and care at home.  Discharged with family 

## 2023-07-06 NOTE — ED Triage Notes (Signed)
He c/o left-sided chest/thoracic area x 1 week. Seen at [facility] today, who performed cxr and read the x-ray as left-sided "infiltrate". Pt. Skin is pale, warm and dry and he is breathing normally with occasional non-productive cough. He tells me he is seen by Hem/Oc for macrocytosis.

## 2023-07-06 NOTE — ED Notes (Signed)
Oxygen while ambulating 95% ?

## 2023-07-08 ENCOUNTER — Other Ambulatory Visit: Payer: Self-pay

## 2023-07-08 DIAGNOSIS — D472 Monoclonal gammopathy: Secondary | ICD-10-CM | POA: Diagnosis not present

## 2023-07-09 ENCOUNTER — Telehealth: Payer: Self-pay | Admitting: Oncology

## 2023-07-09 ENCOUNTER — Encounter: Payer: Self-pay | Admitting: Hematology and Oncology

## 2023-07-09 NOTE — Telephone Encounter (Signed)
I was out of office on 07/08/2023.  I called patient on 07/09/2023 to discuss workup so far.  His wife was on the speaker phone.  Kappa/lambda light chain ratio is significantly elevated at 185.  This is concerning for multiple myeloma.  SPEP showed M protein of 0.2 g.  So he may have significant proteinuria.  24-hour urine sample was submitted yesterday by patient.  Proposed that the next step would be bone marrow biopsy.  Patient does have formal phone call visit with Dr. Pamelia Hoit on 07/10/2023 and he would like to make a decision after that.

## 2023-07-10 ENCOUNTER — Inpatient Hospital Stay (HOSPITAL_BASED_OUTPATIENT_CLINIC_OR_DEPARTMENT_OTHER): Payer: PPO | Admitting: Hematology and Oncology

## 2023-07-10 DIAGNOSIS — D472 Monoclonal gammopathy: Secondary | ICD-10-CM | POA: Diagnosis not present

## 2023-07-10 LAB — KAPPA/LAMBDA LIGHT CHAINS, FREE, WITH RATIO, 24HR. URINE
FR KAPPA LT CH,24HR: 4162.73 mg/(24.h)
FR LAMBDA LT CH,24HR: 9.15 mg/(24.h)
Free Kappa Lt Chains,Ur: 5946.76 mg/L — ABNORMAL HIGH (ref 1.17–86.46)
Free Kappa/Lambda Ratio: 454.99 — ABNORMAL HIGH (ref 1.83–14.26)
Free Lambda Lt Chains,Ur: 13.07 mg/L (ref 0.27–15.21)
Total Volume: 700

## 2023-07-10 NOTE — Assessment & Plan Note (Signed)
Lab review: 04/05/2021: WBC 3.7, hemoglobin 12.1, platelets 227, MCV 99.7 07/14/2021: WBC 3.8, hemoglobin 11.3, platelets 231, MCV 99.3, MCH 34.5, RDW 15.5 11/15/2021: Hemoglobin 11.3, MCV 101.5, WBC 3.8, platelets 234, normal differential 02/07/2022: Hemoglobin 11.2, MCV 101, WBC 2.9, ANC 1.4, B12 and antiparietal cell antibody: Negative 03/14/2022: Hemoglobin 11.2, MCV 100, WBC 3.8, ANC 1.7 09/20/2022: Hemoglobin 11.2, MCV 101, WBC 4.9, ANC 3.4 (patient had recent sinus infection) 07/01/2023: M protein: 0.2 g IgG lambda, kappa 593, lambda 3.2, ratio 185, creatinine 1.05, albumin 4.7, hemoglobin 10.7, MCV 100.6  Recommendation: Patient will follow with Dr.Pasam for the workup of MGUS/myeloma.  B12 deficiency: B12 replacement therapy. No need of additional follow-ups with me at this time.

## 2023-07-10 NOTE — Progress Notes (Signed)
HEMATOLOGY-ONCOLOGY TELEPHONE VISIT PROGRESS NOTE  I connected with our patient on 07/10/23 at  1:15 PM EDT by telephone and verified that I am speaking with the correct person using two identifiers.  I discussed the limitations, risks, security and privacy concerns of performing an evaluation and management service by telephone and the availability of in person appointments.  I also discussed with the patient that there may be a patient responsible charge related to this service. The patient expressed understanding and agreed to proceed.   History of Present Illness:  Discussed the use of AI scribe software for clinical note transcription with the patient, who gave verbal consent to proceed.  History of Present Illness   Ryel, a patient with a history of anemia, was referred for further investigation after a serum protein electrophoresis test detected an M protein. The patient has been experiencing anemia for several years, but it is unclear if this is related to the current concern. The patient also has pneumonia, which may be affecting some of the test results.        Oncology History   No history exists.    REVIEW OF SYSTEMS:   Constitutional: Denies fevers, chills or abnormal weight loss All other systems were reviewed with the patient and are negative. Observations/Objective:     Assessment Plan:  Monoclonal gammopathy Lab review: 04/05/2021: WBC 3.7, hemoglobin 12.1, platelets 227, MCV 99.7 07/14/2021: WBC 3.8, hemoglobin 11.3, platelets 231, MCV 99.3, MCH 34.5, RDW 15.5 11/15/2021: Hemoglobin 11.3, MCV 101.5, WBC 3.8, platelets 234, normal differential 02/07/2022: Hemoglobin 11.2, MCV 101, WBC 2.9, ANC 1.4, B12 and antiparietal cell antibody: Negative 03/14/2022: Hemoglobin 11.2, MCV 100, WBC 3.8, ANC 1.7 09/20/2022: Hemoglobin 11.2, MCV 101, WBC 4.9, ANC 3.4 (patient had recent sinus infection) 07/01/2023: M protein: 0.2 g IgG lambda, kappa 593, lambda 3.2, ratio 185, creatinine  1.05, albumin 4.7, hemoglobin 10.7, MCV 100.6  Recommendation: Patient will follow with Dr.Pasam for the workup of MGUS/myeloma.  B12 deficiency: B12 replacement therapy. No need of additional follow-ups with me at this time. 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 Small amount of M protein (0.25 grams) detected. Elevated light chain (kappa 593, lambda 3.2). No kidney failure or high blood calcium. Pending skeletal survey results. Chronic anemia present but not likely related to MGUS. -Schedule bone marrow biopsy on July 17, 2023, to confirm diagnosis. -Repeat serum protein electrophoresis and light chain assay in 3 months. -Follow-up appointment on July 24, 2023, to discuss results.  Chronic Anemia Long-standing anemia, not likely related to MGUS. No further assessment or plan discussed in the conversation.  Pneumonia Recent infection, possibly contributing to elevated light chain levels. No further assessment or plan discussed in the conversation.     Cancel appt with Dr. Candise Che We decide n treatment options  I discussed the assessment and treatment plan with the patient. The patient was provided an opportunity to ask questions and all were answered. The patient agreed with the plan and demonstrated an understanding of the instructions. The patient was advised to call back or seek an in-person evaluation if the symptoms worsen or if the condition fails to improve as anticipated.   I provided 12 minutes of non-face-to-face time during this encounter.  This includes time for charting and coordination of care   Tamsen Meek, MD

## 2023-07-16 ENCOUNTER — Other Ambulatory Visit: Payer: Self-pay | Admitting: Student

## 2023-07-16 DIAGNOSIS — Z01812 Encounter for preprocedural laboratory examination: Secondary | ICD-10-CM

## 2023-07-16 NOTE — H&P (Signed)
Chief Complaint: Patient was seen in consultation today for monoclonal gammopathy of undetermined significance  Referring Physician(s): Gudena,Vinay  Supervising Physician: Malachy Moan  Patient Status: [facility] - Out-pt  History of Present Illness: Colin Calderon is a 72 y.o. male with a past medical history significant for chronic anemia who presents today for a bone marrow aspiration/biopsy. Colin Calderon was noted to have anemia of unknown etiology several years ago which has been followed by heme/onc. Recently he was noted to have significantly elevated kappa/lambda light chain ratio (185) and SPEP showed M protein 0.2 g. He has been referred to IR for a bone marrow aspiration/biopsy to further evaluate MGUS.  No past medical history on file.  Past Surgical History:  Procedure Laterality Date   HERNIA REPAIR      Allergies: Amoxicillin  Medications: Prior to Admission medications   Medication Sig Start Date End Date Taking? Authorizing Provider  azithromycin (ZITHROMAX) 250 MG tablet Take 1 tablet (250 mg total) by mouth daily. Take first 2 tablets together, then 1 every day until finished. 07/06/23   Henderly, Britni A, PA-C  benzonatate (TESSALON) 100 MG capsule Take 1 capsule (100 mg total) by mouth every 8 (eight) hours. 07/06/23   Henderly, Britni A, PA-C  Cobalamin Combinations (B-12) [phone] MCG SUBL Take 1 tablet by mouth daily.    [provider]  Cyanocobalamin 5000 MCG/ML LIQD 1 tablet 1 day or 1 dose.    [provider]     No family history on file.  Social History   Socioeconomic History   Marital status: Married    Spouse name: Not on file   Number of children: Not on file   Years of education: Not on file   Highest education level: Not on file  Occupational History   Not on file  Tobacco Use   Smoking status: Never   Smokeless tobacco: Not on file  Substance and Sexual Activity   Alcohol use: Yes   Drug use: No   Sexual  activity: Not on file  Other Topics Concern   Not on file  Social History Narrative   Not on file   Social Determinants of Health   Financial Resource Strain: Low Risk  (07/01/2023)   Overall Financial Resource Strain (CARDIA)    Difficulty of Paying Living Expenses: Not hard at all  Food Insecurity: No Food Insecurity (07/01/2023)   Hunger Vital Sign    Worried About Running Out of Food in the Last Year: Never true    Ran Out of Food in the Last Year: Never true  Transportation Needs: No Transportation Needs (07/01/2023)   PRAPARE - Administrator, Civil Service (Medical): No    Lack of Transportation (Non-Medical): No  Physical Activity: Sufficiently Active (07/01/2023)   Exercise Vital Sign    Days of Exercise per Week: 7 days    Minutes of Exercise per Session: 40 min  Stress: No Stress Concern Present (07/01/2023)   [employer] of Occupational Health - Occupational Stress Questionnaire    Feeling of Stress : Not at all  Social Connections: Socially Integrated (07/01/2023)   Social Connection and Isolation Panel [NHANES]    Frequency of Communication with Friends and Family: Not on file    Frequency of Social Gatherings with Friends and Family: Three times a week    Attends Religious Services: More than 4 times per year    Active Member of Clubs or Organizations: Yes    Attends Club or  Organization Meetings: More than 4 times per year    Marital Status: Married     Review of Systems: A 12 point ROS discussed and pertinent positives are indicated in the HPI above.  All other systems are negative.  Review of Systems  Vital Signs: There were no vitals taken for this visit.  Physical Exam       Imaging: DG Bone Survey Met  Result Date: 07/10/2023 CLINICAL DATA:  Newly diagnosed monoclonal gammopathy. EXAM: METASTATIC BONE SURVEY COMPARISON:  08/24/2021 FINDINGS: Lateral skull: No lytic lesion.  Normal vascular lucencies. Left shoulder: No lytic  lesion Right shoulder: No lytic lesion Right humerus: No lytic lesion Left humerus: No lytic lesion Left forearm: No lytic lesion Right forearm: No lytic lesion Cervical spine: Mild degenerative spondylosis and facet arthritis. No lytic lesion. Thoracic spine: No fracture or focal lesion. Lumbar spine: No fracture or focal lesion. One-view chest: No lytic rib lesion. Minimal density in the lingula could be scar, atelectasis or mild pneumonia. AP pelvis: No lytic lesion Left hip: No lytic lesion Left femur: No lytic lesion Right hip: No lytic lesion Right femur: No lytic lesion Left tib fib: No lytic lesion Right tib fib: No lytic lesion IMPRESSION: 1. No lytic lesion seen. Mild degenerative spondylosis and facet arthritis of the cervical spine. 2. Minimal density in the lingula could be scar, atelectasis or mild pneumonia. Electronically Signed   By: Paulina Fusi M.D.   On: 07/10/2023 14:04   DG Chest 2 View  Result Date: 07/06/2023 CLINICAL DATA:  Chest pain EXAM: CHEST - 2 VIEW COMPARISON:  08/24/2021 FINDINGS: Normal cardiac silhouette. There is patchy airspace disease in the lingula and LEFT lower lobe. Potential focus of airspace disease in the RIGHT lower lobe which is less involved. No pneumothorax. No aggressive osseous lesion. IMPRESSION: Concern for multifocal bilateral pneumonia. Followup PA and lateral chest X-ray is recommended in 3-4 weeks following trial of antibiotic therapy to ensure resolution and exclude underlying malignancy. Electronically Signed   By: Genevive Bi M.D.   On: 07/06/2023 14:03    Labs:  CBC: Recent Labs    09/20/22 1103 07/01/23 1515 07/06/23 1245  WBC 4.9 4.4 4.3  HGB 11.2* 10.7* 10.0*  HCT 33.2* 31.7* 30.1*  PLT 294 231 336    COAGS: No results for input(s): "INR", "APTT" in the last 8760 hours.  BMP: Recent Labs    07/01/23 1515 07/06/23 1245  NA 138 138  K 4.4 4.3  CL 103 102  CO2 29 26  GLUCOSE 96 96  BUN 18 19  CALCIUM 9.9 9.4   CREATININE 1.05 1.11  GFRNONAA >60 >60    LIVER FUNCTION TESTS: Recent Labs    07/01/23 1515  BILITOT 0.9  AST 10*  ALT 7  ALKPHOS 40  PROT 6.7  ALBUMIN 4.7    TUMOR MARKERS: No results for input(s): "AFPTM", "CEA", "CA199", "CHROMGRNA" in the last 8760 hours.  Assessment and Plan:  72 y/o M with history of anemia recently found to have MGUS who presents today for a bone marrow aspiration/biopsy to further direct care.  Risks and benefits of bone marrow aspiration/biopsy was discussed with the patient and/or patient's family including, but not limited to bleeding, infection, damage to adjacent structures or low yield requiring additional tests.  All of the questions were answered and there is agreement to proceed.  Consent signed and in chart.  Thank you for this interesting consult.  I greatly enjoyed meeting Colin Calderon and  look forward to participating in their care.  A copy of this report was sent to the requesting provider on this date.  Electronically Signed: Villa Herb, PA-C 07/16/2023, 11:52 AM   I spent a total of  30 Minutes  in face to face in clinical consultation, greater than 50% of which was counseling/coordinating care for monoclonal gammopathy of unknown significance.

## 2023-07-17 ENCOUNTER — Other Ambulatory Visit: Payer: Self-pay

## 2023-07-17 ENCOUNTER — Ambulatory Visit (HOSPITAL_COMMUNITY)
Admission: RE | Admit: 2023-07-17 | Discharge: 2023-07-17 | Disposition: A | Discharge status: Home / Self Care | Payer: PPO | Source: Ambulatory Visit | Attending: Hematology and Oncology | Admitting: Hematology and Oncology

## 2023-07-17 ENCOUNTER — Encounter (HOSPITAL_COMMUNITY): Payer: Self-pay

## 2023-07-17 DIAGNOSIS — D649 Anemia, unspecified: Secondary | ICD-10-CM | POA: Diagnosis not present

## 2023-07-17 DIAGNOSIS — D472 Monoclonal gammopathy: Secondary | ICD-10-CM | POA: Insufficient documentation

## 2023-07-17 DIAGNOSIS — D72819 Decreased white blood cell count, unspecified: Secondary | ICD-10-CM | POA: Insufficient documentation

## 2023-07-17 DIAGNOSIS — Z9222 Personal history of monoclonal drug therapy: Secondary | ICD-10-CM | POA: Diagnosis not present

## 2023-07-17 DIAGNOSIS — D75839 Thrombocytosis, unspecified: Secondary | ICD-10-CM | POA: Insufficient documentation

## 2023-07-17 DIAGNOSIS — D539 Nutritional anemia, unspecified: Secondary | ICD-10-CM | POA: Diagnosis not present

## 2023-07-17 DIAGNOSIS — Z01812 Encounter for preprocedural laboratory examination: Secondary | ICD-10-CM

## 2023-07-17 DIAGNOSIS — Z1379 Encounter for other screening for genetic and chromosomal anomalies: Secondary | ICD-10-CM | POA: Diagnosis not present

## 2023-07-17 LAB — CBC
HCT: 29.4 % — ABNORMAL LOW (ref 39.0–52.0)
Hemoglobin: 9.7 g/dL — ABNORMAL LOW (ref 13.0–17.0)
MCH: 34 pg (ref 26.0–34.0)
MCHC: 33 g/dL (ref 30.0–36.0)
MCV: 103.2 fL — ABNORMAL HIGH (ref 80.0–100.0)
Platelets: 426 10*3/uL — ABNORMAL HIGH (ref 150–400)
RBC: 2.85 MIL/uL — ABNORMAL LOW (ref 4.22–5.81)
RDW: 13.3 % (ref 11.5–15.5)
WBC: 3.8 10*3/uL — ABNORMAL LOW (ref 4.0–10.5)
nRBC: 0 % (ref 0.0–0.2)

## 2023-07-17 MED ORDER — MIDAZOLAM HCL 2 MG/2ML IJ SOLN
INTRAMUSCULAR | Status: AC | PRN
  Administered 2023-07-17 (×2): 1 mg via INTRAVENOUS

## 2023-07-17 MED ORDER — MIDAZOLAM HCL 2 MG/2ML IJ SOLN
INTRAMUSCULAR | Status: AC
  Filled 2023-07-17: qty 4

## 2023-07-17 MED ORDER — SODIUM CHLORIDE 0.9 % IV SOLN: INTRAVENOUS | Status: DC

## 2023-07-17 MED ORDER — FENTANYL CITRATE (PF) 100 MCG/2ML IJ SOLN
INTRAMUSCULAR | Status: AC | PRN
  Administered 2023-07-17 (×2): 50 ug via INTRAVENOUS

## 2023-07-17 MED ORDER — FENTANYL CITRATE (PF) 100 MCG/2ML IJ SOLN
INTRAMUSCULAR | Status: AC
  Filled 2023-07-17: qty 2

## 2023-07-17 NOTE — Progress Notes (Signed)
Given ice bag to use for comfort if needed per instructions.

## 2023-07-17 NOTE — Discharge Instructions (Signed)
Please call Interventional Radiology clinic [phone] with any questions or concerns.  You may remove your dressing and shower tomorrow.  Needle Biopsy of the Bone, Care After This sheet gives you information about how to care for yourself after your procedure. Your health care provider may also give you more specific instructions. If you have problems or questions, contact your health care provider. What can I expect after the procedure? After the procedure, it is common to have soreness or tenderness at the puncture site. Follow these instructions at home: Puncture care A normal biopsy site compared to an infected biopsy site. The infected biopsy site has swelling and redness.  Follow instructions from your health care provider about how to take care of your puncture site. Make sure you: Wash your hands with soap and water before and after you change your bandage (dressing). If soap and water are not available, use hand sanitizer. Change your dressing as told by your health care provider. Check your puncture site every day for signs of infection. Check for: Redness, swelling, or worsening pain. Fluid or blood. Warmth. Pus or a bad smell. General instructions Take over-the-counter and prescription medicines only as told by your health care provider. Do not drive for 24 hours if you were given a sedative during your procedure. Return to your normal activities as told by your health care provider. Keep all follow-up visits as told by your health care provider. This is important. Contact a health care provider if: You have redness, swelling, or worsening pain at the site of your puncture. You have fluid or blood coming from your puncture site. Your puncture site feels warm to the touch. You have pus or a bad smell coming from your puncture site. You have a fever. You have persistent nausea or vomiting. Get help right away if: You develop a rash. You have difficulty  breathing. Summary After the procedure, it is common to have soreness or tenderness at the puncture site. Follow instructions from your health care provider about how to take care of your puncture site. Contact a health care provider if you have any signs of infection. Keep all follow-up visits as told by your health care provider. This is important. This information is not intended to replace advice given to you by your health care provider. Make sure you discuss any questions you have with your health care provider. Document Revised: 11/10/2020 Document Reviewed: 11/10/2020 Elsevier Patient Education  2023 Elsevier Inc.       Moderate Conscious Sedation, Adult, Care After This sheet gives you information about how to care for yourself after your procedure. Your health care provider may also give you more specific instructions. If you have problems or questions, contact your health care provider. What can I expect after the procedure? After the procedure, it is common to have: Sleepiness for several hours. Impaired judgment for several hours. Difficulty with balance. Vomiting if you eat too soon. Follow these instructions at home: For the time period you were told by your health care provider: Rest. Do not participate in activities where you could fall or become injured. Do not drive or use machinery. Do not drink alcohol. Do not take sleeping pills or medicines that cause drowsiness. Do not make important decisions or sign legal documents. Do not take care of children on your own.      Eating and drinking Follow the diet recommended by your health care provider. Drink enough fluid to keep your urine pale yellow. If you vomit: Drink   water, juice, or soup when you can drink without vomiting. Make sure you have little or no nausea before eating solid foods.   General instructions Take over-the-counter and prescription medicines only as told by your health care provider. Have a  responsible adult stay with you for the time you are told. It is important to have someone help care for you until you are awake and alert. Do not smoke. Keep all follow-up visits as told by your health care provider. This is important. Contact a health care provider if: You are still sleepy or having trouble with balance after 24 hours. You feel light-headed. You keep feeling nauseous or you keep vomiting. You develop a rash. You have a fever. You have redness or swelling around the IV site. Get help right away if: You have trouble breathing. You have new-onset confusion at home. Summary After the procedure, it is common to feel sleepy, have impaired judgment, or feel nauseous if you eat too soon. Rest after you get home. Know the things you should not do after the procedure. Follow the diet recommended by your health care provider and drink enough fluid to keep your urine pale yellow. Get help right away if you have trouble breathing or new-onset confusion at home. This information is not intended to replace advice given to you by your health care provider. Make sure you discuss any questions you have with your health care provider. Document Revised: 12/25/2019 Document Reviewed: 07/23/2019 Elsevier Patient Education  2021 Elsevier Inc.  

## 2023-07-17 NOTE — Procedures (Signed)
Interventional Radiology Procedure Note  Procedure: CT guided aspirate and core biopsy of right iliac bone Complications: None Recommendations: - Bedrest supine x 1 hrs - Hydrocodone PRN  Pain - Follow biopsy results  Signed,  Heath K. McCullough, MD   

## 2023-07-23 ENCOUNTER — Telehealth: Payer: PPO | Admitting: Oncology

## 2023-07-23 LAB — SURGICAL PATHOLOGY

## 2023-07-24 ENCOUNTER — Telehealth: Payer: Self-pay | Admitting: *Deleted

## 2023-07-24 ENCOUNTER — Inpatient Hospital Stay: Payer: PPO | Attending: Oncology | Admitting: Hematology and Oncology

## 2023-07-24 DIAGNOSIS — Z79899 Other long term (current) drug therapy: Secondary | ICD-10-CM | POA: Insufficient documentation

## 2023-07-24 DIAGNOSIS — C8300 Small cell B-cell lymphoma, unspecified site: Secondary | ICD-10-CM | POA: Diagnosis not present

## 2023-07-24 DIAGNOSIS — D539 Nutritional anemia, unspecified: Secondary | ICD-10-CM | POA: Insufficient documentation

## 2023-07-24 DIAGNOSIS — D510 Vitamin B12 deficiency anemia due to intrinsic factor deficiency: Secondary | ICD-10-CM | POA: Insufficient documentation

## 2023-07-24 DIAGNOSIS — D472 Monoclonal gammopathy: Secondary | ICD-10-CM | POA: Diagnosis not present

## 2023-07-24 LAB — SURGICAL PATHOLOGY

## 2023-07-24 NOTE — Telephone Encounter (Signed)
Per Dr Pamelia Hoit and discussion with Dr Candise Che per pt new lymphoblastic leukemia with request to be scheduled for visit tomorrow.  Per communication with MD and scheduler obtained appt for 1230 pm tomorrow.  This RN called pt and spoke with him and his wife- appt date/time and location given with agreement to plan.

## 2023-07-24 NOTE — Assessment & Plan Note (Addendum)
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  First population making of 90% positive for CD20, CD43 and negative for CD5 and 10 with kappa light chain restriction.  Second population positive for CD138, CD56 with lambda light chain restriction.  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  Myeloma FISH panel and cytogenetics are pending  Recommendation: PET/CT scan Referral to Dr. Candise Che Blood work for hepatitis B and C testing  History of B12 deficiency, on B12 replacement therapy.

## 2023-07-25 ENCOUNTER — Inpatient Hospital Stay: Payer: PPO | Admitting: Hematology

## 2023-07-25 VITALS — BP 117/70 | HR 64 | Temp 97.7°F | Resp 18 | Wt 175.6 lb

## 2023-07-25 DIAGNOSIS — D472 Monoclonal gammopathy: Secondary | ICD-10-CM | POA: Diagnosis not present

## 2023-07-25 DIAGNOSIS — D539 Nutritional anemia, unspecified: Secondary | ICD-10-CM

## 2023-07-25 DIAGNOSIS — Z79899 Other long term (current) drug therapy: Secondary | ICD-10-CM | POA: Diagnosis not present

## 2023-07-25 DIAGNOSIS — C8300 Small cell B-cell lymphoma, unspecified site: Secondary | ICD-10-CM

## 2023-07-25 DIAGNOSIS — D510 Vitamin B12 deficiency anemia due to intrinsic factor deficiency: Secondary | ICD-10-CM | POA: Diagnosis not present

## 2023-07-25 NOTE — Progress Notes (Signed)
HEMATOLOGY/ONCOLOGY CONSULTATION NOTE  Date of Service: 07/25/2023  Patient Care Team: Daisy Floro, MD as PCP - General (Family Medicine) Jamey Ripa, Magda Paganini, MD as Referring Physician (Neurology)  CHIEF COMPLAINTS/PURPOSE OF CONSULTATION:  evaluation and management of abnormal protein.   HISTORY OF PRESENTING ILLNESS:  Colin Calderon is a wonderful 72 y.o. male who has been referred to [place] by Hall Busing, MD for evaluation and management of abnormal protein.   He presented to the ED on 07/06/2023 for pneumonia of both lungs due to infectious organism.   Today, he is accompanied by his wife and friend. 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 His anemia is gradually worsening, previously 11.2 10 months ago and more recently 9.7 a week ago.   He reports that he was having some numbness in his hands and feet present for 2-4 weeks, which caused him to see a neurologist. As part of the neurologist's workup to see if his symptoms are related to paraproteinemia, there were findings of abnormal protein with M protein level of 0.2 g/dL. His wife reports that he has a nerve study planned for December.   He denies any new fatigue, unexplained fever, chills, night sweats, or unexpected weight loss. He reports a 3-pound weight loss based on his scale at home.   He reports that he recently had pneumonia which was not unusual as he did have pneumonia previosuly. Patient notes that he did develop a fever causing him to present to the ED. Patient denies any SOB at this time, but does endorse sporadic coughing. He denies any issues with urinary infections or sinus infections.   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   He reports having a slightly enlarged prostate.   Patient reports having his [facility] checked with [facility], but can't find the results. He reports a fhx of thyroid issues.   He denies any new leg swelling, skin rashes, change in  vision that is new or different, or headaches that are new or different. Patient denies any abdominal symptoms, such as abdominal pain or abdominal distention.    Patient denies having any chronic medical issues such as heart or lung issues.   He reports that he only takes B-12 replacement. Patient notes that he was previously B12 deficient in the past. He reports that the vitamin pills were not being absorbed.   Patient tends to be vegetarian for the most part, but does eats chicken occasionally.   He has no other medical issues.   He reports a surgical history of inguinal hernia surgery years ago most likely on the right side.   He reports an allergy to amoxicillin with rash reaction. He has not other medication allergies.   Patient does report a history of thyroid disorders in the family.  He reports that his brother passed away from leukemia. Patient reports that his father did have coronary issues 81  Mother 12 years  He is a never smoker and denies any alcohol consumption or use of other recreational chemicals.   Previously, patient was generally very active through activities including hiking, walking, and swimming. However, over the last couple of months he has endorsed new fatigue alongside worsening anemia. His wife reports that he has sometimes felt too tired to drive, which is unusual. She also noticed that he lags behind when they go for walks together.   He complains of poor sleep mostly due to urinary frequency, which is not a  new symptom. 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 Patient reports very sensitive arms and legs.   His wife reports that patient previously had a loud voice and has lost his voice recently, which patient attributes to pneumonia. He denies any chest discomfort at this time.       -patient has had some gradual worsening anemia previously 11.2 ten months ago and 9.7 one week ago -his other blood counts  from two weeks ago have been normal with platelet levels of 336K and WBC of 4.3. -more recent labs from one week ago show WBC 3.8K and platelets 426K. -CMP stable with no abnormality of the kidneys or liver function -patient was found to have an M protein level of 0.2 g/dL, which is not significantly impressive. -discussed that igG levels are in the 500s, but igA level is low at 7 and IgM is undetectable. Discussed that low IgA and IgM does suppress some types of antibodies. Discussed that low antibodies does need further evaluation.   -educated patient on different antibody subtypes -discussed different potential reasons for low IgA and IgM antibodies  -discussed that his light chains were impressive with suggestion of a proliferative element. Kappa light chain level of 600 and lambda light chains were suppressed.   Light chains were quite improessive  -educated patient that pernicious anemia antibodies may affect B12 absorption    -discussed finding of IgG lambda type of M protein  Two findinds: M protien igG lambda type of M protein Different clone making only kappa light chains  -discussed that there are two different populations of cells based on findings.   Discussed that there is a large amount of cells that appear to be Lymphoplasmacytoid cells and based on CD20 positive findings, would be suggestive of a low-grade lymphoma. Discussed that this would be the process associates with making large amounts of kappa light chains. Genetics are pending at this time, and the most likely possibility is Lymphoplasmacytic lymphoma. Discussed that in a fraction of individuals, there may be production of IgM antibody protein associated with Estil Daft. Discussed that Marginal zone lymphoma may be a possibility.  Discussed that there is an another population of cells that are making IgG lambda monoclonal protein at small levels. There were findings of positive CD138 in 5-10% of lymphoplasmacytoid  cells. 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    -Macrocytosis for a while  -will order MYD88 mutation testing to define further and confirm lypmhplasmacytic lymphoma   -discussed that   Small pop of plasa cells dont thing needs treatment at this time.  -way to define that - if more than 10% of abnormal then would be smoldering.  -genetics pending  Low grade lympha would be treated if bothersome.   -patient does have a PET scan scheduled for tomorrow to evaluate burden of disease  -answered all of patient's, his wife's, and friend's questions in detail  -previous x rays have been normal  -discussed that it is unusual two different clonal populations, though not necessarily rare.   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 Some clonal plasema cells classified based on genetics and scan .   -patient did not exceed more than 10% so MGUS More than 10% smokldering myeoma Criteria CRAB than plasma cells disorder if more than 10% and meets criteria, then active myeloma,  which not likely.   -based on geenetics, and amnt protein   -educated patient that there is a 1-2% change of progression to active myeloma to year  -Treatment consideration would be for low grade lympha if affecting him and not MGUS  -discussed potential side affects of lymphoma  -specific treatment regimen would depend on the specific type of lymphoma, his health overall, and how aggressive he chooses to treat his condition  -if this is wolistroms, there re other options   -discussed details of potential treatment options depending on   Most likely bendamustine and rituxan  -lymphomas are generally not inherited by germ line mutations.   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 Liposomal liquid formula  His b12 not low at this time.   Nerve study good idea. If has neuropathy, may have bearing on treatment esp if neurology feel related to paraproteinemia.   -abnormal igm protein assoc w neuropathyoes related ot that abnormal igM protein  Patient reports that his mother did have "horrible" neuropathy and is constantly in pain. When older she's is 91.   Mild 3 pound weight loss on his scale.     Patient denies any spine pain or abdominal pain.    Over the last 6 months, when walking, he walks behind her.     -start B complex to optimize blood counts as much as possible  He did have thyroid function checked recently  Patient denies any obvious lumps/bumps   -what we are seeing has been present for a while   -light chain burden is not high enough in blood (more than 1000) concern cotting kidney or other organ issues -recommend patient to drink at least 64 ounces of water daily to keep kidneys flushed  -if lymph node is accessible, there may be biopsy if accessible  -wait two weeks for genetic testing  -return in two weeks  -if pet scan genetics stable, will plan  -if treatment sooner, maybe start antibodies initially  -most likely rituxan only initially than add chemo later if needed       Two entities on bone marrow     MEDICAL HISTORY:  No past medical history on file.  SURGICAL HISTORY: Past Surgical History:  Procedure Laterality Date   HERNIA REPAIR      SOCIAL HISTORY: Social History   Socioeconomic History   Marital status: Married    Spouse name: Not on file   Number of children: Not on file   Years of education: Not on file   Highest education level: Not on file  Occupational History   Not on file  Tobacco Use   Smoking status:  Never   Smokeless tobacco: Not on file  Substance and Sexual Activity   Alcohol use: Yes   Drug use: No   Sexual activity: Not on file  Other Topics Concern   Not on file  Social History Narrative   Not on file   Social Determinants of Health   Financial Resource Strain: Low Risk  (07/01/2023)   Overall Financial Resource Strain (CARDIA)    Difficulty of Paying Living Expenses: Not hard at all  Food Insecurity: No Food Insecurity (07/01/2023)   Hunger Vital Sign    Worried About Running Out  of Food in the Last Year: Never true    Ran Out of Food in the Last Year: Never true  Transportation Needs: No Transportation Needs (07/01/2023)   PRAPARE - Administrator, Civil Service (Medical): No    Lack of Transportation (Non-Medical): No  Physical Activity: Sufficiently Active (07/01/2023)   Exercise Vital Sign    Days of Exercise per Week: 7 days    Minutes of Exercise per Session: 40 min  Stress: No Stress Concern Present (07/01/2023)   [employer] of Occupational Health - Occupational Stress Questionnaire    Feeling of Stress : Not at all  Social Connections: Socially Integrated (07/01/2023)   Social Connection and Isolation Panel [NHANES]    Frequency of Communication with Friends and Family: Not on file    Frequency of Social Gatherings with Friends and Family: Three times a week    Attends Religious Services: More than 4 times per year    Active Member of Clubs or Organizations: Yes    Attends Banker Meetings: More than 4 times per year    Marital Status: Married  Catering manager Violence: Not At Risk (07/01/2023)   Humiliation, Afraid, Rape, and Kick questionnaire    Fear of Current or Ex-Partner: No    Emotionally Abused: No    Physically Abused: No    Sexually Abused: No    FAMILY HISTORY: No family history on file.  ALLERGIES:  is allergic to amoxicillin.  MEDICATIONS:  Current Outpatient Medications  Medication Sig Dispense  Refill   azithromycin (ZITHROMAX) 250 MG tablet Take 1 tablet (250 mg total) by mouth daily. Take first 2 tablets together, then 1 every day until finished. 6 tablet 0   benzonatate (TESSALON) 100 MG capsule Take 1 capsule (100 mg total) by mouth every 8 (eight) hours. 21 capsule 0   Cobalamin Combinations (B-12) 3857907487 MCG SUBL Take 1 tablet by mouth daily.     Cyanocobalamin 5000 MCG/ML LIQD 1 tablet 1 day or 1 dose.     No current facility-administered medications for this visit.    REVIEW OF SYSTEMS:    10 Point review of Systems was done is negative except as noted above.  PHYSICAL EXAMINATION: ECOG PERFORMANCE STATUS: {CHL ONC ECOG NG:[id number]}  .There were no vitals filed for this visit. There were no vitals filed for this visit. .There is no height or weight on file to calculate BMI.  GENERAL:alert, in no acute distress and comfortable SKIN: no acute rashes, no significant lesions EYES: conjunctiva are pink and non-injected, sclera anicteric OROPHARYNX: MMM, no exudates, no oropharyngeal erythema or ulceration NECK: supple, no JVD LYMPH:  no palpable lymphadenopathy in the cervical, axillary or inguinal regions LUNGS: clear to auscultation b/l with normal respiratory effort HEART: regular rate & rhythm ABDOMEN:  normoactive bowel sounds , non tender, not distended. Extremity: no pedal edema PSYCH: alert & oriented x 3 with fluent speech NEURO: no focal motor/sensory deficits  LABORATORY DATA:  I have reviewed the data as listed  .    Latest Ref Rng & Units 07/17/2023    8:10 AM 07/06/2023   12:45 PM 07/01/2023    3:15 PM  CBC  WBC 4.0 - 10.5 K/uL 3.8  4.3  4.4   Hemoglobin 13.0 - 17.0 g/dL 9.7  40.1  02.7   Hematocrit 39.0 - 52.0 % 29.4  30.1  31.7   Platelets 150 - 400 K/uL 426  336  231     .  Latest Ref Rng & Units 07/06/2023   12:45 PM 07/01/2023    3:15 PM 06/22/2021    8:54 AM  CMP  Glucose 70 - 99 mg/dL 96  96  95   BUN 8 - 23 mg/dL 19  18   15    Creatinine 0.61 - 1.24 mg/dL 6.43  3.29  5.18   Sodium 135 - 145 mmol/L 138  138  141   Potassium 3.5 - 5.1 mmol/L 4.3  4.4  4.9   Chloride 98 - 111 mmol/L 102  103  107   CO2 22 - 32 mmol/L 26  29  27    Calcium 8.9 - 10.3 mg/dL 9.4  9.9  9.1   Total Protein 6.5 - 8.1 g/dL  6.7  6.3   Total Bilirubin 0.3 - 1.2 mg/dL  0.9  0.5   Alkaline Phos 38 - 126 U/L  40  41   AST 15 - 41 U/L  10  13   ALT 0 - 44 U/L  7  7      07/17/2023 Surgical pathology:   07/17/2023 Surgical pathology:      RADIOGRAPHIC STUDIES: I have personally reviewed the radiological images as listed and agreed with the findings in the report. CT BONE MARROW BIOPSY & ASPIRATION  Result Date: 07/17/2023 INDICATION: Monoclonal gammopathy of undetermined significance EXAM: CT GUIDED BONE MARROW ASPIRATION AND CORE BIOPSY Interventional Radiologist:  Sterling Big, MD MEDICATIONS: None. ANESTHESIA/SEDATION: Moderate (conscious) sedation was employed during this procedure. A total of 2 milligrams versed and 100 micrograms fentanyl were administered intravenously. The patient's level of consciousness and vital signs were monitored continuously by radiology nursing throughout the procedure under my direct supervision. Total monitored sedation time: 10 minutes FLUOROSCOPY: None. COMPLICATIONS: None immediate. Estimated blood loss: <25 mL PROCEDURE: Informed written consent was obtained from the patient after a thorough discussion of the procedural risks, benefits and alternatives. All questions were addressed. Maximal Sterile Barrier Technique was utilized including caps, mask, sterile gowns, sterile gloves, sterile drape, hand hygiene and skin antiseptic. A timeout was performed prior to the initiation of the procedure. The patient was positioned prone and non-contrast localization CT was performed of the pelvis to demonstrate the iliac marrow spaces. Maximal barrier sterile technique utilized including caps, mask, sterile  gowns, sterile gloves, large sterile drape, hand hygiene, and betadine prep. Under sterile conditions and local anesthesia, an 11 gauge coaxial bone biopsy needle was advanced into the right iliac marrow space. Needle position was confirmed with CT imaging. Initially, bone marrow aspiration was performed. Next, the 11 gauge outer cannula was utilized to obtain a right iliac bone marrow core biopsy. Needle was removed. Hemostasis was obtained with compression. The patient tolerated the procedure well. Samples were prepared with the cytotechnologist. IMPRESSION: Technically successful right iliac bone marrow aspiration and core biopsy. Electronically Signed   By: Malachy Moan M.D.   On: 07/17/2023 12:20   DG Bone Survey Met  Result Date: 07/10/2023 CLINICAL DATA:  Newly diagnosed monoclonal gammopathy. EXAM: METASTATIC BONE SURVEY COMPARISON:  08/24/2021 FINDINGS: Lateral skull: No lytic lesion.  Normal vascular lucencies. Left shoulder: No lytic lesion Right shoulder: No lytic lesion Right humerus: No lytic lesion Left humerus: No lytic lesion Left forearm: No lytic lesion Right forearm: No lytic lesion Cervical spine: Mild degenerative spondylosis and facet arthritis. No lytic lesion. Thoracic spine: No fracture or focal lesion. Lumbar spine: No fracture or focal lesion. One-view chest: No lytic rib lesion. Minimal density in the lingula  could be scar, atelectasis or mild pneumonia. AP pelvis: No lytic lesion Left hip: No lytic lesion Left femur: No lytic lesion Right hip: No lytic lesion Right femur: No lytic lesion Left tib fib: No lytic lesion Right tib fib: No lytic lesion IMPRESSION: 1. No lytic lesion seen. Mild degenerative spondylosis and facet arthritis of the cervical spine. 2. Minimal density in the lingula could be scar, atelectasis or mild pneumonia. Electronically Signed   By: Paulina Fusi M.D.   On: 07/10/2023 14:04   DG Chest 2 View  Result Date: 07/06/2023 CLINICAL DATA:  Chest pain  EXAM: CHEST - 2 VIEW COMPARISON:  08/24/2021 FINDINGS: Normal cardiac silhouette. There is patchy airspace disease in the lingula and LEFT lower lobe. Potential focus of airspace disease in the RIGHT lower lobe which is less involved. No pneumothorax. No aggressive osseous lesion. IMPRESSION: Concern for multifocal bilateral pneumonia. Followup PA and lateral chest X-ray is recommended in 3-4 weeks following trial of antibiotic therapy to ensure resolution and exclude underlying malignancy. Electronically Signed   By: Genevive Bi M.D.   On: 07/06/2023 14:03    ASSESSMENT & PLAN:  72 y.o. male with:  Abnormal protein   PLAN:  FOLLOW-UP: ***  The total time spent in the appointment was *** minutes* .  All of the patient's questions were answered with apparent satisfaction. The patient knows to call the clinic with any problems, questions or concerns.   Wyvonnia Lora MD MS AAHIVMS A [facility] Hematology/Oncology Physician [facility]  .*Total Encounter Time as defined by the Centers for Medicare and Medicaid Services includes, in addition to the face-to-face time of a patient visit (documented in the note above) non-face-to-face time: obtaining and reviewing outside history, ordering and reviewing medications, tests or procedures, care coordination (communications with other health care professionals or caregivers) and documentation in the medical record.    I,Mitra Faeizi,acting as a Neurosurgeon for Wyvonnia Lora, MD.,have documented all relevant documentation on the behalf of Wyvonnia Lora, MD,as directed by  Wyvonnia Lora, MD while in the presence of Wyvonnia Lora, MD.  ***

## 2023-07-25 NOTE — Progress Notes (Signed)
HEMATOLOGY-ONCOLOGY TELEPHONE VISIT PROGRESS NOTE  I connected with our patient on 07/25/23 at  3:00 PM EST by telephone and verified that I am speaking with the correct person using two identifiers.  I discussed the limitations, risks, security and privacy concerns of performing an evaluation and management service by telephone and the availability of in person appointments.  I also discussed with the patient that there may be a patient responsible charge related to this service. The patient expressed understanding and agreed to proceed.   History of Present Illness: Telephone follow-up to discuss results of bone marrow biopsy  History of Present Illness   The patient, with a history of anemia and large-sized red cells, presents with a recent diagnosis of a malignancy involving lymphatic and plasmacytic cells. The malignancy is significantly affecting the bone marrow, causing anemia. The patient and his wife express anxiety about the diagnosis and are seeking clarification about the pathology report, prognosis, and potential treatment options. The patient's anemia has been on and off for the last couple of years, with hemoglobin levels dropping below ten only recently. The patient and his wife are planning to travel in the near future and are concerned about how the diagnosis and potential treatment might affect these plans.          REVIEW OF SYSTEMS:   Constitutional: Denies fevers, chills or abnormal weight loss All other systems were reviewed with the patient and are negative. Observations/Objective:     Assessment Plan:  Monoclonal gammopathy 07/01/2023: M protein: 0.2 g IgG lambda, kappa 593, lambda 3.2, ratio 185, creatinine 1.05, albumin 4.7, hemoglobin 10.7, MCV 100.6   07/17/2023: Bone marrow biopsy: Hypercellular bone marrow 70% with 2 clonal plasmacytoid populations.  First population making of 90% positive for CD20, CD43 and negative for CD5 and 10 with kappa light chain  restriction.  Second population positive for CD138, CD56 with lambda light chain restriction.  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  Myeloma FISH panel and cytogenetics are pending  Recommendation: PET/CT scan Referral to Dr. Candise Che  History of B12 deficiency, on B12 replacement therapy.   I discussed the assessment and treatment plan with the patient. The patient was provided an opportunity to ask questions and all were answered. The patient agreed with the plan and demonstrated an understanding of the instructions. The patient was advised to call back or seek an in-person evaluation if the symptoms worsen or if the condition fails to improve as anticipated.   I provided 12 minutes of non-face-to-face time during this encounter.  This includes time for charting and coordination of care   Tamsen Meek, MD

## 2023-07-26 ENCOUNTER — Encounter
Admission: RE | Admit: 2023-07-26 | Discharge: 2023-07-26 | Disposition: A | Discharge status: Home / Self Care | Payer: PPO | Source: Ambulatory Visit | Attending: Hematology and Oncology | Admitting: Hematology and Oncology

## 2023-07-26 DIAGNOSIS — C8300 Small cell B-cell lymphoma, unspecified site: Secondary | ICD-10-CM | POA: Diagnosis not present

## 2023-07-26 DIAGNOSIS — C8590 Non-Hodgkin lymphoma, unspecified, unspecified site: Secondary | ICD-10-CM | POA: Diagnosis not present

## 2023-07-26 LAB — GLUCOSE, CAPILLARY: Glucose-Capillary: 82 mg/dL (ref 70–99)

## 2023-07-26 MED ORDER — FLUDEOXYGLUCOSE F - 18 (FDG) INJECTION
9.1000 | Freq: Once | INTRAVENOUS | Status: AC | PRN
  Administered 2023-07-26: 9.45 via INTRAVENOUS

## 2023-07-29 ENCOUNTER — Encounter (HOSPITAL_COMMUNITY): Payer: Self-pay

## 2023-07-31 ENCOUNTER — Encounter (HOSPITAL_COMMUNITY): Payer: Self-pay

## 2023-08-02 NOTE — Progress Notes (Signed)
Patient referred to Dr Candise Che from Dr Pamelia Hoit, seen on 07/25/23. Request for biopsy to be sent to [facility] for second opinion on 08/01/23, all information sent to [facility] in Pathology 08/01/23. Will continue to monitor.

## 2023-08-04 ENCOUNTER — Encounter: Payer: Self-pay | Admitting: Hematology

## 2023-08-05 ENCOUNTER — Other Ambulatory Visit: Payer: PPO

## 2023-08-06 ENCOUNTER — Encounter (HOSPITAL_COMMUNITY): Payer: Self-pay

## 2023-08-06 ENCOUNTER — Ambulatory Visit: Payer: PPO | Admitting: Hematology and Oncology

## 2023-08-13 ENCOUNTER — Inpatient Hospital Stay: Payer: PPO | Attending: Oncology | Admitting: Hematology

## 2023-08-13 VITALS — BP 128/74 | HR 70 | Temp 97.5°F | Resp 18 | Ht 71.0 in | Wt 174.5 lb

## 2023-08-13 DIAGNOSIS — C8598 Non-Hodgkin lymphoma, unspecified, lymph nodes of multiple sites: Secondary | ICD-10-CM | POA: Diagnosis not present

## 2023-08-13 DIAGNOSIS — D472 Monoclonal gammopathy: Secondary | ICD-10-CM | POA: Insufficient documentation

## 2023-08-13 DIAGNOSIS — N401 Enlarged prostate with lower urinary tract symptoms: Secondary | ICD-10-CM | POA: Diagnosis not present

## 2023-08-13 DIAGNOSIS — D4989 Neoplasm of unspecified behavior of other specified sites: Secondary | ICD-10-CM

## 2023-08-13 DIAGNOSIS — D539 Nutritional anemia, unspecified: Secondary | ICD-10-CM | POA: Diagnosis not present

## 2023-08-13 NOTE — Progress Notes (Signed)
HEMATOLOGY/ONCOLOGY CLINIC NOTE  Date of Service: 08/13/2023  Patient Care Team: Daisy Floro, MD as PCP - General (Family Medicine) Jamey Ripa, Magda Paganini, MD as Referring Physician (Neurology)  CHIEF COMPLAINTS/PURPOSE OF CONSULTATION:  Evaluation and management of abnormal protein.  NHL + MGUS  HISTORY OF PRESENTING ILLNESS:   Colin Calderon is a wonderful 72 y.o. male who has been referred to [place] by Hall Busing, MD for evaluation and management of abnormal protein.   He presented to the ED on 07/06/2023 for pneumonia of both lungs due to infectious organism.   Today, he is accompanied by his wife and friend. 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 His anemia is gradually worsening, previously hgb was11.2, 10 months ago and more recently 9.7 a week ago.   He reports that he was having some numbness in his hands and feet present for 2-4 weeks, which caused him to see a neurologist. As part of the neurologist's workup to see if his symptoms are related to paraproteinemia, there were findings of abnormal protein with M protein level of 0.2 g/dL. His wife reports that he has a nerve conduction study planned for December.   He denies any new fatigue, unexplained fever, chills, night sweats, or unexpected weight loss. He reports a 3-pound weight loss based on his scale at home.   He reports that he recently had pneumonia which was not unusual as he did have pneumonia previosuly. Patient notes that he did develop a fever causing him to present to the ED. Patient denies any SOB at this time, but does endorse sporadic coughing. He denies any issues with urinary infections or sinus infections.   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   He reports having a slightly enlarged prostate.   Patient reports having his [facility] checked with [facility] recently, but can't find the results. He reports a fhx of thyroid issues.   He denies any new leg swelling,  skin rashes, change in vision that is new or different, or headaches that are new or different. Patient denies any abdominal symptoms, such as abdominal pain or abdominal distention.    Patient denies having any chronic medical issues such as heart or lung issues.   He reports that he only takes B-12 replacement. Patient notes that he was previously B12 deficient in the past. He reports that the vitamin pills were not being absorbed.   Patient tends to be vegetarian for the most part, but does eats chicken occasionally.   He has no other medical issues.   He reports a surgical history of inguinal hernia surgery years ago most likely on the right side.   He reports an allergy to amoxicillin with rash reaction. He has not other medication allergies.   Patient does report a history of thyroid disorders in the family.  He reports that his brother passed away from leukemia. Patient reports that his father did have coronary issues 28  Mother 4 years  He is a never smoker and denies any alcohol consumption or use of other recreational chemicals.   Previously, patient was generally very active through activities including hiking, walking, and swimming. However, over the last couple of months he has endorsed new fatigue alongside worsening anemia. His wife reports that he has sometimes felt too tired to drive, which is unusual. She also noticed that he lags behind when they go for walks together over the last six months.   He complains of  poor sleep mostly due to urinary frequency, which is not a new symptom. 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 Patient reports very sensitive arms and legs. Patient reports that his mother did have "horrible" neuropathy and is constantly in pain.   His wife reports that patient previously had a loud voice and has lost his voice recently, which patient attributes to pneumonia. He denies any chest discomfort at this  time.   He did have thyroid function checked recently. Patient denies any obvious lumps/bumps.Patient denies any spine pain or abdominal pain.   INTERVAL HISTORY: DEPRIEST GAHAN is a wonderful 72 y.o. male who is here for continued evaluation and management of abnormal protein.  Patient was initially seen by me on 07/25/2023.  Patient is accompanied by his friend and his wife during this visit. He denies any new infection issues, fever, chills, SOB, night sweats, unexpected weight loss, back pain, chest pain, or leg swelling.   Patient has a second referral with [facility], Dr. Marzella Schlein.  MEDICAL HISTORY:  No past medical history on file.  SURGICAL HISTORY: Past Surgical History:  Procedure Laterality Date   HERNIA REPAIR      SOCIAL HISTORY: Social History   Socioeconomic History   Marital status: Married    Spouse name: Not on file   Number of children: Not on file   Years of education: Not on file   Highest education level: Not on file  Occupational History   Not on file  Tobacco Use   Smoking status: Never   Smokeless tobacco: Not on file  Substance and Sexual Activity   Alcohol use: Yes   Drug use: No   Sexual activity: Not on file  Other Topics Concern   Not on file  Social History Narrative   Not on file   Social Determinants of Health   Financial Resource Strain: Low Risk  (07/01/2023)   Overall Financial Resource Strain (CARDIA)    Difficulty of Paying Living Expenses: Not hard at all  Food Insecurity: No Food Insecurity (07/01/2023)   Hunger Vital Sign    Worried About Running Out of Food in the Last Year: Never true    Ran Out of Food in the Last Year: Never true  Transportation Needs: No Transportation Needs (07/01/2023)   PRAPARE - Administrator, Civil Service (Medical): No    Lack of Transportation (Non-Medical): No  Physical Activity: Sufficiently Active (07/01/2023)   Exercise Vital Sign    Days of Exercise per Week:  7 days    Minutes of Exercise per Session: 40 min  Stress: No Stress Concern Present (07/01/2023)   [employer] of Occupational Health - Occupational Stress Questionnaire    Feeling of Stress : Not at all  Social Connections: Socially Integrated (07/01/2023)   Social Connection and Isolation Panel [NHANES]    Frequency of Communication with Friends and Family: Not on file    Frequency of Social Gatherings with Friends and Family: Three times a week    Attends Religious Services: More than 4 times per year    Active Member of Clubs or Organizations: Yes    Attends Banker Meetings: More than 4 times per year    Marital Status: Married  Catering manager Violence: Not At Risk (07/01/2023)   Humiliation, Afraid, Rape, and Kick questionnaire    Fear of Current or Ex-Partner: No    Emotionally Abused: No  Physically Abused: No    Sexually Abused: No    FAMILY HISTORY: No family history on file.  ALLERGIES:  is allergic to amoxicillin.  MEDICATIONS:  Current Outpatient Medications  Medication Sig Dispense Refill   azithromycin (ZITHROMAX) 250 MG tablet Take 1 tablet (250 mg total) by mouth daily. Take first 2 tablets together, then 1 every day until finished. (Patient not taking: Reported on 07/25/2023) 6 tablet 0   benzonatate (TESSALON) 100 MG capsule Take 1 capsule (100 mg total) by mouth every 8 (eight) hours. (Patient not taking: Reported on 07/25/2023) 21 capsule 0   Cobalamin Combinations (B-12) [phone] MCG SUBL Take 1 tablet by mouth daily. (Patient not taking: Reported on 07/25/2023)     Cyanocobalamin 5000 MCG/ML LIQD 1 tablet 1 day or 1 dose.     No current facility-administered medications for this visit.    REVIEW OF SYSTEMS:    10 Point review of Systems was done is negative except as noted above.  PHYSICAL EXAMINATION: ECOG PERFORMANCE STATUS: 1 - Symptomatic but completely ambulatory  . Vitals:   08/13/23 1501  BP: 128/74  Pulse: 70   Resp: 18  Temp: (!) 97.5 F (36.4 C)  SpO2: 100%    Filed Weights   08/13/23 1501  Weight: 174 lb 8 oz (79.2 kg)    .Body mass index is 24.34 kg/m.  GENERAL:alert, in no acute distress and comfortable SKIN: no acute rashes, no significant lesions EYES: conjunctiva are pink and non-injected, sclera anicteric OROPHARYNX: MMM, no exudates, no oropharyngeal erythema or ulceration NECK: supple, no JVD LYMPH:  no palpable lymphadenopathy in the cervical, axillary or inguinal regions LUNGS: clear to auscultation b/l with normal respiratory effort HEART: regular rate & rhythm ABDOMEN:  normoactive bowel sounds , non tender, not distended. Extremity: no pedal edema PSYCH: alert & oriented x 3 with fluent speech NEURO: no focal motor/sensory deficits  LABORATORY DATA:  I have reviewed the data as listed  .    Latest Ref Rng & Units 07/17/2023    8:10 AM 07/06/2023   12:45 PM 07/01/2023    3:15 PM  CBC  WBC 4.0 - 10.5 K/uL 3.8  4.3  4.4   Hemoglobin 13.0 - 17.0 g/dL 9.7  30.1  60.1   Hematocrit 39.0 - 52.0 % 29.4  30.1  31.7   Platelets 150 - 400 K/uL 426  336  231     .    Latest Ref Rng & Units 07/06/2023   12:45 PM 07/01/2023    3:15 PM 06/22/2021    8:54 AM  CMP  Glucose 70 - 99 mg/dL 96  96  95   BUN 8 - 23 mg/dL 19  18  15    Creatinine 0.61 - 1.24 mg/dL 0.93  2.35  5.73   Sodium 135 - 145 mmol/L 138  138  141   Potassium 3.5 - 5.1 mmol/L 4.3  4.4  4.9   Chloride 98 - 111 mmol/L 102  103  107   CO2 22 - 32 mmol/L 26  29  27    Calcium 8.9 - 10.3 mg/dL 9.4  9.9  9.1   Total Protein 6.5 - 8.1 g/dL  6.7  6.3   Total Bilirubin 0.3 - 1.2 mg/dL  0.9  0.5   Alkaline Phos 38 - 126 U/L  40  41   AST 15 - 41 U/L  10  13   ALT 0 - 44 U/L  7  7  07/17/2023 Surgical pathology:   07/17/2023 Surgical pathology:      RADIOGRAPHIC STUDIES: I have personally reviewed the radiological images as listed and agreed with the findings in the report. NM PET Image  Initial (PI) Skull Base To Thigh  Result Date: 08/05/2023 CLINICAL DATA:  Initial treatment strategy for lymphoma. EXAM: NUCLEAR MEDICINE PET SKULL BASE TO THIGH TECHNIQUE: 9.5 mCi F-18 FDG was injected intravenously. Full-ring PET imaging was performed from the skull base to thigh after the radiotracer. CT data was obtained and used for attenuation correction and anatomic localization. Fasting blood glucose: 82 mg/dl COMPARISON:  CT chest 16/06/9603. FINDINGS: Mediastinal blood pool activity: SUV max 2.0 Liver activity: SUV max 2.7 NECK: Right level III hypermetabolic lymph node measures 4 mm, SUV max 5.1. Incidental CT findings: None. CHEST: Hypermetabolic mediastinal bihilar and left internal mammary lymph nodes. Index low right paratracheal lymph node measures 10 mm (6/72), SUV max 12.7. Hypermetabolic extrapleural lymph node in the inferomedial left hemithorax measures 7 mm (6/100), SUV max 4.1. Incidental CT findings: Left anterior descending coronary artery calcification. Decreased attenuation of the intravascular compartment is indicative of anemia. Heart is enlarged. No pericardial or pleural effusion. ABDOMEN/PELVIS: Hypermetabolic periportal lymph nodes measure approximately 6 mm (6/113), SUV max 5.7. No additional abnormal hypermetabolism. Incidental CT findings: Enlarged prostate with bladder wall thickening. SKELETON: No abnormal hypermetabolism. Incidental CT findings: Degenerative changes in the spine. IMPRESSION: 1. Hypermetabolic lymph nodes in the low back, chest and upper abdomen (Deauville 4-5). 2. Left anterior descending coronary artery calcification. 3. Enlarged prostate. Associated bladder wall thickening, indicative of an element of outlet obstruction. Electronically Signed   By: Leanna Battles M.D.   On: 08/05/2023 15:37   CT BONE MARROW BIOPSY & ASPIRATION  Result Date: 07/17/2023 INDICATION: Monoclonal gammopathy of undetermined significance EXAM: CT GUIDED BONE MARROW ASPIRATION  AND CORE BIOPSY Interventional Radiologist:  Sterling Big, MD MEDICATIONS: None. ANESTHESIA/SEDATION: Moderate (conscious) sedation was employed during this procedure. A total of 2 milligrams versed and 100 micrograms fentanyl were administered intravenously. The patient's level of consciousness and vital signs were monitored continuously by radiology nursing throughout the procedure under my direct supervision. Total monitored sedation time: 10 minutes FLUOROSCOPY: None. COMPLICATIONS: None immediate. Estimated blood loss: <25 mL PROCEDURE: Informed written consent was obtained from the patient after a thorough discussion of the procedural risks, benefits and alternatives. All questions were addressed. Maximal Sterile Barrier Technique was utilized including caps, mask, sterile gowns, sterile gloves, sterile drape, hand hygiene and skin antiseptic. A timeout was performed prior to the initiation of the procedure. The patient was positioned prone and non-contrast localization CT was performed of the pelvis to demonstrate the iliac marrow spaces. Maximal barrier sterile technique utilized including caps, mask, sterile gowns, sterile gloves, large sterile drape, hand hygiene, and betadine prep. Under sterile conditions and local anesthesia, an 11 gauge coaxial bone biopsy needle was advanced into the right iliac marrow space. Needle position was confirmed with CT imaging. Initially, bone marrow aspiration was performed. Next, the 11 gauge outer cannula was utilized to obtain a right iliac bone marrow core biopsy. Needle was removed. Hemostasis was obtained with compression. The patient tolerated the procedure well. Samples were prepared with the cytotechnologist. IMPRESSION: Technically successful right iliac bone marrow aspiration and core biopsy. Electronically Signed   By: Malachy Moan M.D.   On: 07/17/2023 12:20    ASSESSMENT & PLAN:    72 y.o. 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 Significantly elevated Kappa light chains in the serum and urine with BM Bx showing a second population of lymphoplasmacytoid cells which are 90% of the lymphoplasmacytoid cells which are CD20+ve and neg for CD5 and CD10 and show Kappa light chain restriction. 3. 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  -Discussed PET scan result from 07/26/2023 in detail with the patient. Showed Hypermetabolic lymph nodes in the lower neck, chest and upper abdomen (Deauville 4-5). Left anterior descending coronary artery calcification. Enlarged prostate. Associated bladder wall thickening, indicative of an element of outlet obstruction. -Discussed molecular pathology results. Showed CCND1 mutation.  -Discussed with the patient that we are looking at [facility] to read the pathology results. Dr [place] Callas -- also has sent out CD19 staining. Flow cytometry did show a clonal B cell population. -Discussed with the patient that there is a possibility of Marginal zone lymphoma,  -MYD88 mutation testing neg -- less likely LPL -Discussed that the we need to biopsy a enlarged lymph node. However, the lymph node in his neck are small to biopsy and other lymph nodes are unreachable.  -if MZL would proceed with BR -Educated the patient about Bendamustine plus Rituxan treatment regimen. Standard treatment wwould be 4-6 cycles.  -Patient notes that he has second referral to [facility] hematology/oncology, Dr. Marzella Schlein. Biclonal plasma cell population vs low grade NHL ?MZL +  MGUS. -Educated the patient he difference between Lymphoma, smoldering myeloma, and MGUS. -Answered al of patient's questions regarding pathology results, MGUS, low-grade lymphoma, and questions regarding treatment of Bendamustine plus Rituxan.  -We will wait for clear pathology result from [facility] and we will wait for the Dr. Daryl Eastern visit before going ahead with treatment.  -discussed with Dr [place] Callas /pathology  FOLLOW-UP: Based on [facility] hill pathology results  The total time spent in the appointment was 32 minutes* .  All of the patient's questions were answered with apparent satisfaction. The patient knows to call the clinic with any problems, questions or concerns.   Wyvonnia Lora MD MS AAHIVMS [facility] Hematology/Oncology Physician [facility]  .*Total Encounter Time as defined by the Centers for Medicare and Medicaid Services includes, in addition to the face-to-face time of a patient visit (documented in the note above) non-face-to-face time: obtaining and reviewing outside history, ordering and reviewing medications, tests or procedures, care coordination (communications with other health care professionals or caregivers) and documentation in the medical record.   I,Param Shah,acting as a Neurosurgeon for Wyvonnia Lora, MD.,have documented all relevant documentation on the behalf of Wyvonnia Lora, MD,as directed by  Wyvonnia Lora, MD while in the presence of Wyvonnia Lora, MD.   .I have reviewed the above documentation for accuracy and completeness, and I agree with the above. Johney Maine MD

## 2023-08-15 DIAGNOSIS — E8581 Light chain (AL) amyloidosis: Secondary | ICD-10-CM | POA: Diagnosis not present

## 2023-08-18 ENCOUNTER — Encounter: Payer: Self-pay | Admitting: Hematology

## 2023-08-20 ENCOUNTER — Other Ambulatory Visit: Payer: PPO

## 2023-08-20 ENCOUNTER — Ambulatory Visit: Payer: PPO | Admitting: Hematology

## 2023-08-21 ENCOUNTER — Encounter: Payer: Self-pay | Admitting: Hematology

## 2023-08-22 DIAGNOSIS — G629 Polyneuropathy, unspecified: Secondary | ICD-10-CM | POA: Diagnosis not present

## 2023-08-22 DIAGNOSIS — G250 Essential tremor: Secondary | ICD-10-CM | POA: Diagnosis not present

## 2023-08-22 DIAGNOSIS — G5603 Carpal tunnel syndrome, bilateral upper limbs: Secondary | ICD-10-CM | POA: Diagnosis not present

## 2023-08-27 ENCOUNTER — Ambulatory Visit: Payer: Self-pay | Admitting: Diagnostic Neuroimaging

## 2023-09-20 ENCOUNTER — Other Ambulatory Visit: Payer: Self-pay | Admitting: Lab

## 2023-09-20 ENCOUNTER — Other Ambulatory Visit: Payer: Self-pay

## 2023-09-20 ENCOUNTER — Inpatient Hospital Stay: Payer: PPO | Attending: Oncology

## 2023-09-20 ENCOUNTER — Other Ambulatory Visit: Payer: Self-pay | Admitting: Hematology

## 2023-09-20 DIAGNOSIS — D7589 Other specified diseases of blood and blood-forming organs: Secondary | ICD-10-CM

## 2023-09-20 DIAGNOSIS — D472 Monoclonal gammopathy: Secondary | ICD-10-CM | POA: Diagnosis present

## 2023-09-20 DIAGNOSIS — C8300 Small cell B-cell lymphoma, unspecified site: Secondary | ICD-10-CM

## 2023-09-20 DIAGNOSIS — D4989 Neoplasm of unspecified behavior of other specified sites: Secondary | ICD-10-CM

## 2023-09-20 LAB — CBC WITH DIFFERENTIAL (CANCER CENTER ONLY)
Abs Immature Granulocytes: 0.01 10*3/uL (ref 0.00–0.07)
Basophils Absolute: 0 10*3/uL (ref 0.0–0.1)
Basophils Relative: 1 %
Eosinophils Absolute: 0.1 10*3/uL (ref 0.0–0.5)
Eosinophils Relative: 2 %
HCT: 31.7 % — ABNORMAL LOW (ref 39.0–52.0)
Hemoglobin: 10.8 g/dL — ABNORMAL LOW (ref 13.0–17.0)
Immature Granulocytes: 0 %
Lymphocytes Relative: 34 %
Lymphs Abs: 1.5 10*3/uL (ref 0.7–4.0)
MCH: 33.5 pg (ref 26.0–34.0)
MCHC: 34.1 g/dL (ref 30.0–36.0)
MCV: 98.4 fL (ref 80.0–100.0)
Monocytes Absolute: 0.2 10*3/uL (ref 0.1–1.0)
Monocytes Relative: 3 %
Neutro Abs: 2.6 10*3/uL (ref 1.7–7.7)
Neutrophils Relative %: 60 %
Platelet Count: 261 10*3/uL (ref 150–400)
RBC: 3.22 MIL/uL — ABNORMAL LOW (ref 4.22–5.81)
RDW: 13.2 % (ref 11.5–15.5)
WBC Count: 4.4 10*3/uL (ref 4.0–10.5)
nRBC: 0 % (ref 0.0–0.2)

## 2023-09-20 LAB — CMP (CANCER CENTER ONLY)
ALT: 12 U/L (ref 0–44)
AST: 16 U/L (ref 15–41)
Albumin: 4.3 g/dL (ref 3.5–5.0)
Alkaline Phosphatase: 39 U/L (ref 38–126)
Anion gap: 4 — ABNORMAL LOW (ref 5–15)
BUN: 20 mg/dL (ref 8–23)
CO2: 30 mmol/L (ref 22–32)
Calcium: 9.3 mg/dL (ref 8.9–10.3)
Chloride: 107 mmol/L (ref 98–111)
Creatinine: 0.91 mg/dL (ref 0.61–1.24)
GFR, Estimated: >60 mL/min (ref >60)
Glucose, Bld: 103 mg/dL — ABNORMAL HIGH (ref 70–99)
Potassium: 4.4 mmol/L (ref 3.5–5.1)
Sodium: 141 mmol/L (ref 135–145)
Total Bilirubin: 0.4 mg/dL (ref 0.0–1.2)
Total Protein: 6.2 g/dL — ABNORMAL LOW (ref 6.5–8.1)

## 2023-09-20 LAB — LACTATE DEHYDROGENASE: LDH: 122 U/L (ref 98–192)

## 2023-09-20 LAB — VITAMIN B12: Vitamin B-12: 362 pg/mL (ref 180–914)

## 2023-09-23 LAB — KAPPA/LAMBDA LIGHT CHAINS
Kappa free light chain: 685.8 mg/L — ABNORMAL HIGH (ref 3.3–19.4)
Kappa, lambda light chain ratio: 214.31 — ABNORMAL HIGH (ref 0.26–1.65)
Lambda free light chains: 3.2 mg/L — ABNORMAL LOW (ref 5.7–26.3)

## 2023-09-27 LAB — MULTIPLE MYELOMA PANEL, SERUM
Albumin SerPl Elph-Mcnc: 4 g/dL (ref 2.9–4.4)
Albumin/Glob SerPl: 2.4 — ABNORMAL HIGH (ref 0.7–1.7)
Alpha 1: 0.2 g/dL (ref 0.0–0.4)
Alpha2 Glob SerPl Elph-Mcnc: 0.5 g/dL (ref 0.4–1.0)
B-Globulin SerPl Elph-Mcnc: 0.6 g/dL — ABNORMAL LOW (ref 0.7–1.3)
Gamma Glob SerPl Elph-Mcnc: 0.4 g/dL (ref 0.4–1.8)
Globulin, Total: 1.7 g/dL — ABNORMAL LOW (ref 2.2–3.9)
IgA: 7 mg/dL — ABNORMAL LOW (ref 61–437)
IgG (Immunoglobin G), Serum: 568 mg/dL — ABNORMAL LOW (ref 603–1613)
IgM (Immunoglobulin M), Srm: <5 mg/dL — ABNORMAL LOW (ref 15–143)
M Protein SerPl Elph-Mcnc: 0.2 g/dL — ABNORMAL HIGH
Total Protein ELP: 5.7 g/dL — ABNORMAL LOW (ref 6.0–8.5)

## 2023-10-04 ENCOUNTER — Encounter: Payer: Self-pay | Admitting: Hematology

## 2023-10-04 NOTE — Progress Notes (Signed)
Spoke to pt to let him know per Dr Candise Che we want him to get PET scan and then we will evaluate.

## 2023-10-10 ENCOUNTER — Encounter: Payer: Self-pay | Admitting: Hematology

## 2023-10-17 NOTE — Progress Notes (Signed)
 HEMATOLOGY/ONCOLOGY CLINIC NOTE  Date of Service: 10/18/2023  Patient Care Team: Okey Carlin Redbird, MD as PCP - General (Family Medicine) Huey, [name], MD as Referring Physician (Neurology)  CHIEF COMPLAINTS/PURPOSE OF CONSULTATION:  F/u for evaluation and management of newly diagnosed multiple myeloma  HISTORY OF PRESENTING ILLNESS:   Colin Calderon is a wonderful 73 y.o. male who has been referred to us  by [name] Huey, MD for evaluation and management of abnormal protein.   He presented to the ED on 07/06/2023 for pneumonia of both lungs due to infectious organism.   Today, he is accompanied by his wife and friend. 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 His anemia is gradually worsening, previously hgb was11.2, 10 months ago and more recently 9.7 a week ago.   He reports that he was having some numbness in his hands and feet present for 2-4 weeks, which caused him to see a neurologist. As part of the neurologist's workup to see if his symptoms are related to paraproteinemia, there were findings of abnormal protein with M protein level of 0.2 g/dL. His wife reports that he has a nerve conduction study planned for December.   He denies any new fatigue, unexplained fever, chills, night sweats, or unexpected weight loss. He reports a 3-pound weight loss based on his scale at home.   He reports that he recently had pneumonia which was not unusual as he did have pneumonia previosuly. Patient notes that he did develop a fever causing him to present to the ED. Patient denies any SOB at this time, but does endorse sporadic coughing. He denies any issues with urinary infections or sinus infections.   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   He reports having a slightly enlarged prostate.   Patient reports having his thyroid  checked with [facility] recently, but can't find the results. He reports a fhx of thyroid  issues.   He denies any new leg  swelling, skin rashes, change in vision that is new or different, or headaches that are new or different. Patient denies any abdominal symptoms, such as abdominal pain or abdominal distention.    Patient denies having any chronic medical issues such as heart or lung issues.   He reports that he only takes B-12 replacement. Patient notes that he was previously B12 deficient in the past. He reports that the vitamin pills were not being absorbed.   Patient tends to be vegetarian for the most part, but does eats chicken occasionally.   He has no other medical issues.   He reports a surgical history of inguinal hernia surgery years ago most likely on the right side.   He reports an allergy to amoxicillin with rash reaction. He has not other medication allergies.   Patient does report a history of thyroid  disorders in the family.  He reports that his brother passed away from leukemia. Patient reports that his father did have coronary issues 6  Mother 60 years  He is a never smoker and denies any alcohol consumption or use of other recreational chemicals.   Previously, patient was generally very active through activities including hiking, walking, and swimming. However, over the last couple of months he has endorsed new fatigue alongside worsening anemia. His wife reports that he has sometimes felt too tired to drive, which is unusual. She also noticed that he lags behind when they go for walks together over the last six months.   He complains of  poor sleep mostly due to urinary frequency, which is not a new symptom. 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 Patient reports very sensitive arms and legs. Patient reports that his mother did have horrible neuropathy and is constantly in pain.   His wife reports that patient previously had a loud voice and has lost his voice recently, which patient attributes to pneumonia. He denies any chest discomfort  at this time.   He did have thyroid  function checked recently. Patient denies any obvious lumps/bumps.Patient denies any spine pain or abdominal pain.   INTERVAL HISTORY:  Colin Calderon is a wonderful 73 y.o. male who is here for continued evaluation and management of abnormal protein.  Patient was last seen by me on 08/13/2023 and was doing well overall with no new medical complaints.   Patient received a second opinion from Dr. Fernande, who is agreeable with DRVD treatment regimen, though there are some concerns with Velcade worsening his nerve issues.   Patient is accompanied by his wife during this visit and his friend is also on call.   He reports having numbness and tingling in his bilateral upper extrmeities. His nerve symptoms are mostly in his bilateral hands and radiate up his arms. His carpal tunnel syndrome is limiting his motor function and was determined to be grade 3. He reports that his thumb is constantly numb and complains of loss of grip strength. Patient denies any nerve symptoms in the lower extremities. Patient reports that his upper extremity symptoms began in August 2024 and worsened over time.   Patient was seen by a neurologist who ordered a nerve conduction study. He was seen by an orthopedic surgeon who determined that patient has carpal tunnel syndrome. There are considerations of steroid injections and surgery for carpal tunnel.   Patient has previosuly tried interventions including wrist brace and voltaren gel, which did not improve symptoms.   Patient's mother did endorse significant neuropathy and familial tremor.   Patient generally stays fairly active daily.   He denies any back pain, abdominal pain, issues with bowel movement, or leg swelling.   His wife reports that patient's older brother passed away from leukemia.   Patient does report that there may be considerations to travel to [place]  some time in the future to visit his new grandchild.    MEDICAL HISTORY:  No past medical history on file.  SURGICAL HISTORY: Past Surgical History:  Procedure Laterality Date   HERNIA REPAIR      SOCIAL HISTORY: Social History   Socioeconomic History   Marital status: Married    Spouse name: Not on file   Number of children: Not on file   Years of education: Not on file   Highest education level: Not on file  Occupational History   Not on file  Tobacco Use   Smoking status: Never   Smokeless tobacco: Not on file  Substance and Sexual Activity   Alcohol use: Yes   Drug use: No   Sexual activity: Not on file  Other Topics Concern   Not on file  Social History Narrative   Not on file   Social Drivers of Health   Financial Resource Strain: Low Risk  (07/01/2023)   Overall Financial Resource Strain (CARDIA)    Difficulty of Paying Living Expenses: Not hard at all  Food Insecurity: No Food Insecurity (07/01/2023)   Hunger Vital Sign    Worried About Programme Researcher, Broadcasting/film/video in  the Last Year: Never true    Ran Out of Food in the Last Year: Never true  Transportation Needs: No Transportation Needs (07/01/2023)   PRAPARE - Administrator, Civil Service (Medical): No    Lack of Transportation (Non-Medical): No  Physical Activity: Sufficiently Active (07/01/2023)   Exercise Vital Sign    Days of Exercise per Week: 7 days    Minutes of Exercise per Session: 40 min  Stress: No Stress Concern Present (07/01/2023)   [employer] of Occupational Health - Occupational Stress Questionnaire    Feeling of Stress : Not at all  Social Connections: Socially Integrated (07/01/2023)   Social Connection and Isolation Panel [NHANES]    Frequency of Communication with Friends and Family: Not on file    Frequency of Social Gatherings with Friends and Family: Three times a week    Attends Religious Services: More than 4 times per year    Active Member of Clubs or Organizations: Yes    Attends Banker Meetings: More  than 4 times per year    Marital Status: Married  Catering Manager Violence: Not At Risk (07/01/2023)   Humiliation, Afraid, Rape, and Kick questionnaire    Fear of Current or Ex-Partner: No    Emotionally Abused: No    Physically Abused: No    Sexually Abused: No    FAMILY HISTORY: No family history on file.  ALLERGIES:  is allergic to amoxicillin.  MEDICATIONS:  Current Outpatient Medications  Medication Sig Dispense Refill   azithromycin  (ZITHROMAX ) 250 MG tablet Take 1 tablet (250 mg total) by mouth daily. Take first 2 tablets together, then 1 every day until finished. 6 tablet 0   benzonatate  (TESSALON ) 100 MG capsule Take 1 capsule (100 mg total) by mouth every 8 (eight) hours. 21 capsule 0   Cobalamin Combinations (B-12) [id number] MCG SUBL Take 1 tablet by mouth daily.     Cyanocobalamin  5000 MCG/ML LIQD 1 tablet 1 day or 1 dose.     No current facility-administered medications for this visit.    REVIEW OF SYSTEMS:    10 Point review of Systems was done is negative except as noted above.   PHYSICAL EXAMINATION: ECOG PERFORMANCE STATUS: 1 - Symptomatic but completely ambulatory  . Vitals:   10/18/23 1343  BP: 117/69  Pulse: 72  Resp: 20  Temp: 98.1 F (36.7 C)  SpO2: 99%   Filed Weights   10/18/23 1343  Weight: 182 lb (82.6 kg)   .Body mass index is 25.38 kg/m.   GENERAL:alert, in no acute distress and comfortable SKIN: no acute rashes, no significant lesions EYES: conjunctiva are pink and non-injected, sclera anicteric OROPHARYNX: MMM, no exudates, no oropharyngeal erythema or ulceration NECK: supple, no JVD LYMPH:  no palpable lymphadenopathy in the cervical, axillary or inguinal regions LUNGS: clear to auscultation b/l with normal respiratory effort HEART: regular rate & rhythm ABDOMEN:  normoactive bowel sounds , non tender, not distended. Extremity: no pedal edema PSYCH: alert & oriented x 3 with fluent speech NEURO: no focal motor/sensory  deficits   LABORATORY DATA:  I have reviewed the data as listed  .    Latest Ref Rng & Units 09/20/2023    2:26 PM 07/17/2023    8:10 AM 07/06/2023   12:45 PM  CBC  WBC 4.0 - 10.5 K/uL 4.4  3.8  4.3   Hemoglobin 13.0 - 17.0 g/dL 89.1  9.7  89.9   Hematocrit 39.0 - 52.0 % 31.7  29.4  30.1   Platelets 150 - 400 K/uL 261  426  336     .    Latest Ref Rng & Units 09/20/2023    2:26 PM 07/06/2023   12:45 PM 07/01/2023    3:15 PM  CMP  Glucose 70 - 99 mg/dL 896  96  96   BUN 8 - 23 mg/dL 20  19  18    Creatinine 0.61 - 1.24 mg/dL 9.08  8.88  8.94   Sodium 135 - 145 mmol/L 141  138  138   Potassium 3.5 - 5.1 mmol/L 4.4  4.3  4.4   Chloride 98 - 111 mmol/L 107  102  103   CO2 22 - 32 mmol/L 30  26  29    Calcium 8.9 - 10.3 mg/dL 9.3  9.4  9.9   Total Protein 6.5 - 8.1 g/dL 6.2   6.7   Total Bilirubin 0.0 - 1.2 mg/dL 0.4   0.9   Alkaline Phos 38 - 126 U/L 39   40   AST 15 - 41 U/L 16   10   ALT 0 - 44 U/L 12   7      07/17/2023 Surgical pathology:   07/17/2023 Surgical pathology:      RADIOGRAPHIC STUDIES: I have personally reviewed the radiological images as listed and agreed with the findings in the report. No results found.  ASSESSMENT & PLAN:    73 y.o. 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 Newly diagnosed Multiple myeloma -Kappa light chain producing.  BM Bx showing a second population of lymphoplasmacytoid cells which are 90% of the lymphoplasmacytoid cells which are CD20+ve and neg for CD5 and CD10 and show Kappa light chain restriction. 3. 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  4. 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 Monoclonal B lymphocytosis  Overall picture concerning for BM involvement by  Lymphoplasmacytic lymphoma + MGUS vs less likely Biclonal Plasma cell neoplasm  PLAN:  -discussed that pathologist has conducted additional testing and confirmed that most of the cells making kappa light chains are plasma cells and present primarily as multiple myeloma. There is a small population of clonal B lymphocytes.  -discussed that his bone marrow biopsy showed two plasma cell populations. One of which is a small monoclonal population making igG lambda which should be monitored as MGUS. Discussed that the primary focus of treatment would be for the 60-70% population in the bone marrow making kappa light chains.  -In regards to the CRAB criteria, patient has no hypercalcemia, renal failure, or bone tumors. Patient does have mild anemia.  -patient does meet criteria to treat active myeloma based on kappa free light chains increased from 500s to 600s and  K/L ratio in 200s -discussed that he will need treatment, though it would not be emergent -last labs did not show significant Macrocytosis  -educated patient on details of the spectrum of plasma cell dyscrasias -educated patient that if there is at least 10% abnormal plasma cells, it would be considered smoldering myeloma -educated patient that when there are more than 60% of abnormal plasma cells, it would be classified as active myeloma even if there are no other issues -will treat for primary pathology of myeloma. His small lymphocyte population is not felt  to be actionable at this time -discussed that his condition is not curable but very treatable -discussed details of treatment regimen with patient -discussed that the standard treatment regiment would involve a combination of 3-4 targeted therapies, including: Daratumumab  - Discussed the risk of allergic reactions, which he would receive medication to prevent.  Steroids - Discussed that if wrist swelling is due to inflammation, steroids would reduce swelling. However, steroids can cause  fluid retention and extra swelling could worsen symptoms. Discussed that dexamethasone  can cause insomnia, fluid retention, elevated blood sugars, or mild blood pressure elevation Velcade - increases the risk of neuropathy and could worsen his nerve issues. We will delay velcade or potentially use a different medication in the same category such as Ninlaro or Carfilzomib due to his nerve issues. Will need Acyclovir  for SHingles prophylaxis Revlimid  - Discussed risk that Revlimid  may increase the risk of blood clots, though carpal tunnel surgery is not a complete significant risk. Will need daily ASA 81mg  to reduce VTE risk. -if he chooses to proceed with carpal tunnel surgery, we will adjust treatment timing accordingly  -discussed details of induction phase of treatment with goal with at least very good partial response to bring down the number of plasma cells by more than 90%.  -after the induction phase of treatment, there would be a need to make a decision whether to proceed with maintenance phase with Revlimid  and aspirin  or to consider a more aggressive option of an autologous bone marrow transplant, which would involve discussing with Dr. Fernande. Discussed that the autologous bone marrow transplant would involve high-dose chemotherapy.  -will set up education session with chemotherapy nurses to discuss details of treatment regimen -discussed that he would need to be regularly monitored with treatment, including his blood counts  -treatment will involve supportive medications including a preventive dose of acyclovir  to prevent shingles; baby aspirin  to reduce the risk of blood clots, and nausea medications to use as needed -educated patient that sometimes zometa is used for bone strengthening, though there would be a need to ensure there are no acute new dental issues  -discussed that if his carpal tunnel is not addressed, it could potentially cause irreversible damage to the nerve -if there is no  role for carpal tunnel surgery, steroid injections would be reasonable -will start treatment without velcade for 1-2 months, and adjust as needed or switch to Ninlaro or Carfilzomib to not risk more nerve injury -Will plan for PET scan after treating myeloma  -would recommend taking reasonable precautions generally avoiding crowds and wearing facial masks in public settings -advised patient to stay UTD with his age-appropriate vaccines  -recommend at least 30 minutes of brisk walking daily to reduce fatigue or other side effects -recommend stay in touch with family and friends to stay connected with the community -discussed that there are several contact points which shall be engaged as needed, including my nurse, PA, and symptom management clinic if symptoms needs ot be addressed more urgently  -Answered all of patient's and his wife's questions in great detail  FOLLOW-UP: Chemo-counseling for DVRD + Zometa (holding velcade for now) Plz schedule to start DRD as soon as possible MD visit with C1D8 of treatment for toxicity check  The total time spent in the appointment was 41 minutes* .  All of the patient's questions were answered with apparent satisfaction. The patient knows to call the clinic with any problems, questions or concerns.   Emaline Saran MD MS AAHIVMS [facility] Hematology/Oncology Physician [facility]  .*Total Encounter Time as defined by the Centers for Medicare and Medicaid Services includes, in addition to the face-to-face time of a patient visit (documented in the note above) non-face-to-face time: obtaining and reviewing outside history, ordering and reviewing medications, tests or procedures, care coordination (communications with other health care professionals or caregivers) and documentation in the medical record.    I,Mitra Faeizi,acting as a neurosurgeon for Emaline Saran, MD.,have documented all relevant documentation on the behalf of Emaline Saran, MD,as  directed by  Emaline Saran, MD while in the presence of Emaline Saran, MD.  .I have reviewed the above documentation for accuracy and completeness, and I agree with the above. .Kileen Lange Kishore Jene Oravec MD

## 2023-10-18 ENCOUNTER — Inpatient Hospital Stay: Payer: PPO | Attending: Oncology | Admitting: Hematology

## 2023-10-18 VITALS — BP 117/69 | HR 72 | Temp 98.1°F | Resp 20 | Wt 182.0 lb

## 2023-10-18 DIAGNOSIS — D539 Nutritional anemia, unspecified: Secondary | ICD-10-CM | POA: Insufficient documentation

## 2023-10-18 DIAGNOSIS — C9000 Multiple myeloma not having achieved remission: Secondary | ICD-10-CM

## 2023-10-18 DIAGNOSIS — Z79899 Other long term (current) drug therapy: Secondary | ICD-10-CM | POA: Diagnosis not present

## 2023-10-19 ENCOUNTER — Encounter: Payer: Self-pay | Admitting: Hematology

## 2023-10-21 ENCOUNTER — Encounter (HOSPITAL_COMMUNITY): Payer: PPO

## 2023-10-24 ENCOUNTER — Encounter: Payer: Self-pay | Admitting: Hematology

## 2023-10-24 DIAGNOSIS — C9000 Multiple myeloma not having achieved remission: Secondary | ICD-10-CM | POA: Insufficient documentation

## 2023-10-24 MED ORDER — LENALIDOMIDE 25 MG PO CAPS: 25.0000 mg | ORAL_CAPSULE | Freq: Every day | ORAL | 0 refills | Status: DC

## 2023-10-24 MED ORDER — ACYCLOVIR 400 MG PO TABS: 400.0000 mg | ORAL_TABLET | Freq: Two times a day (BID) | ORAL | 5 refills | Status: DC

## 2023-10-24 MED ORDER — ONDANSETRON HCL 8 MG PO TABS: 8.0000 mg | ORAL_TABLET | Freq: Three times a day (TID) | ORAL | 1 refills | Status: AC | PRN

## 2023-10-24 MED ORDER — ASPIRIN 81 MG PO TBEC: 81.0000 mg | DELAYED_RELEASE_TABLET | Freq: Every day | ORAL | 3 refills | Status: AC

## 2023-10-24 MED ORDER — PROCHLORPERAZINE MALEATE 10 MG PO TABS: 10.0000 mg | ORAL_TABLET | Freq: Four times a day (QID) | ORAL | 1 refills | Status: AC | PRN

## 2023-10-24 MED ORDER — DEXAMETHASONE 4 MG PO TABS: ORAL_TABLET | ORAL | 5 refills | Status: DC

## 2023-10-24 NOTE — Progress Notes (Signed)
START ON PATHWAY REGIMEN - Multiple Myeloma and Other Plasma Cell Dyscrasias   DaraVRd (Daratumumab SUBQ + Bortezomib SUBQ D1,4,8,11 + Lenalidomide PO D1-14 + Dexamethasone 20 mg IV/PO D1,2,8,9,15,16) q21 Days (Induction Schema):   A cycle is every 21 days:     Lenalidomide      Dexamethasone      Bortezomib      Daratumumab and hyaluronidase-fihj    DaraVRd (Daratumumab SUBQ + Bortezomib SUBQ D1,4,8,11 + Lenalidomide PO D1-14 + Dexamethasone 20 mg IV/PO D1,2,8,9,15,16) q21 Days (Consolidation Schema):   A cycle is every 21 days:     Lenalidomide      Dexamethasone      Bortezomib      Daratumumab and hyaluronidase-fihj   **Always confirm dose/schedule in your pharmacy ordering system**  Patient Characteristics: Multiple Myeloma, Newly Diagnosed, Transplant Eligible, Standard Risk Disease Classification: Multiple Myeloma Therapeutic Status: Newly Diagnosed R2-ISS Staging: I Is Patient Eligible for Transplant<= Transplant Eligible Risk Status: Standard Risk Intent of Therapy: Non-Curative / Palliative Intent, Discussed with Patient

## 2023-10-25 ENCOUNTER — Telehealth: Payer: Self-pay

## 2023-10-25 ENCOUNTER — Encounter: Payer: Self-pay | Admitting: Hematology

## 2023-10-25 ENCOUNTER — Other Ambulatory Visit (HOSPITAL_COMMUNITY): Payer: Self-pay

## 2023-10-25 ENCOUNTER — Telehealth: Payer: Self-pay | Admitting: Pharmacy Technician

## 2023-10-25 NOTE — Telephone Encounter (Signed)
Oral Oncology Patient Advocate Encounter  Was successful in securing patient a $12,000 grant from [facility] to provide copayment coverage for lenalidomide.  This will keep the out of pocket expense at $0.     [employer] ID: [id number]  I have spoken with the patient.   The billing information is as follows and has been shared with [facility]   RxBin: [id number] PCN: [id number] Member ID: [id number] Group ID: [id number] Dates of Eligibility: 09/25/23 through 09/23/24  Fund:  MM  Jinger Neighbors, CPhT-Adv Oncology Pharmacy Patient Advocate [facility] Direct Number: [phone]  Fax: [phone]

## 2023-10-25 NOTE — Telephone Encounter (Signed)
Oral Oncology Patient Advocate Encounter  Prior Authorization for lenalidomide has been approved.    PA# [id number] Effective dates: 10/25/23 through 10/24/24  Patients co-pay is $1,956.19.    Jinger Neighbors, CPhT-Adv Oncology Pharmacy Patient Advocate [facility] Direct Number: [phone]  Fax: [phone]\

## 2023-10-25 NOTE — Telephone Encounter (Signed)
Oral Oncology Patient Advocate Encounter   Received notification that prior authorization for lenalidomide is required.   PA submitted on 10/25/23 Key [id number] Status is pending     Jinger Neighbors, CPhT-Adv Oncology Pharmacy Patient Advocate [facility] Direct Number: [phone]  Fax: [phone]

## 2023-10-25 NOTE — Telephone Encounter (Signed)
Oral Oncology Pharmacist Encounter  Received new prescription for Revlimid (lenalidomide) for the treatment of multiple myeloma in conjunction with daratumumab and dexamethasone (velcade delayed and possibility of switching to different medication in future due to neuropathy), planned duration until disease progression or unacceptable toxicity and depending on response and for 6 cycles.  Labs from 09/20/2023 (CBC, CMP) assessed, no interventions needed. Prescription dose and frequency are being verified with MD. Patient will take for 21 days on, 7 days off and repeat every 28 days per MD. Prescription will be modified.   Current medication list in Epic reviewed, no significant/ relevant DDIs with Revlimid identified.  Evaluated chart and no patient barriers to medication adherence noted.   Patient agreement for treatment documented in MD note on 10/18/2023.  Prescription has been e-scribed to the [facility] for benefits analysis and approval.  Oral Oncology Clinic will continue to follow for insurance authorization, copayment issues, initial counseling and start date.  Colin Calderon, PharmD Hematology/Oncology Clinical Pharmacist [facility] Oral Chemotherapy Navigation Clinic [phone] 10/25/2023 9:27 AM

## 2023-10-26 ENCOUNTER — Other Ambulatory Visit: Payer: Self-pay

## 2023-10-28 ENCOUNTER — Ambulatory Visit: Payer: PPO | Admitting: Hematology

## 2023-10-31 ENCOUNTER — Telehealth: Payer: Self-pay | Admitting: Hematology

## 2023-10-31 NOTE — Telephone Encounter (Signed)
 Left patient a vm regarding upcoming appointment

## 2023-10-31 NOTE — Progress Notes (Signed)

## 2023-11-01 ENCOUNTER — Other Ambulatory Visit: Payer: Self-pay

## 2023-11-01 ENCOUNTER — Ambulatory Visit: Payer: PPO | Admitting: Hematology

## 2023-11-04 ENCOUNTER — Other Ambulatory Visit: Payer: Self-pay

## 2023-11-04 ENCOUNTER — Inpatient Hospital Stay: Payer: PPO

## 2023-11-04 DIAGNOSIS — C9000 Multiple myeloma not having achieved remission: Secondary | ICD-10-CM

## 2023-11-04 MED ORDER — LENALIDOMIDE 25 MG PO CAPS: 25.0000 mg | ORAL_CAPSULE | Freq: Every day | ORAL | 0 refills | Status: DC

## 2023-11-05 ENCOUNTER — Other Ambulatory Visit (HOSPITAL_COMMUNITY): Payer: Self-pay

## 2023-11-07 ENCOUNTER — Inpatient Hospital Stay: Payer: PPO

## 2023-11-07 ENCOUNTER — Other Ambulatory Visit: Payer: PPO

## 2023-11-07 VITALS — BP 115/74 | HR 62 | Temp 98.3°F | Resp 18

## 2023-11-07 DIAGNOSIS — C9000 Multiple myeloma not having achieved remission: Secondary | ICD-10-CM

## 2023-11-07 LAB — CMP (CANCER CENTER ONLY)
ALT: 8 U/L (ref 0–44)
AST: 12 U/L — ABNORMAL LOW (ref 15–41)
Albumin: 4.2 g/dL (ref 3.5–5.0)
Alkaline Phosphatase: 40 U/L (ref 38–126)
Anion gap: 3 — ABNORMAL LOW (ref 5–15)
BUN: 20 mg/dL (ref 8–23)
CO2: 31 mmol/L (ref 22–32)
Calcium: 9.4 mg/dL (ref 8.9–10.3)
Chloride: 108 mmol/L (ref 98–111)
Creatinine: 1.1 mg/dL (ref 0.61–1.24)
GFR, Estimated: >60 mL/min (ref >60)
Glucose, Bld: 108 mg/dL — ABNORMAL HIGH (ref 70–99)
Potassium: 4.7 mmol/L (ref 3.5–5.1)
Sodium: 142 mmol/L (ref 135–145)
Total Bilirubin: 0.4 mg/dL (ref 0.0–1.2)
Total Protein: 6.2 g/dL — ABNORMAL LOW (ref 6.5–8.1)

## 2023-11-07 LAB — CBC WITH DIFFERENTIAL (CANCER CENTER ONLY)
Abs Immature Granulocytes: 0.01 10*3/uL (ref 0.00–0.07)
Basophils Absolute: 0 10*3/uL (ref 0.0–0.1)
Basophils Relative: 1 %
Eosinophils Absolute: 0.1 10*3/uL (ref 0.0–0.5)
Eosinophils Relative: 1 %
HCT: 33.3 % — ABNORMAL LOW (ref 39.0–52.0)
Hemoglobin: 11.1 g/dL — ABNORMAL LOW (ref 13.0–17.0)
Immature Granulocytes: 0 %
Lymphocytes Relative: 30 %
Lymphs Abs: 1.3 10*3/uL (ref 0.7–4.0)
MCH: 33.4 pg (ref 26.0–34.0)
MCHC: 33.3 g/dL (ref 30.0–36.0)
MCV: 100.3 fL — ABNORMAL HIGH (ref 80.0–100.0)
Monocytes Absolute: 0.2 10*3/uL (ref 0.1–1.0)
Monocytes Relative: 4 %
Neutro Abs: 2.7 10*3/uL (ref 1.7–7.7)
Neutrophils Relative %: 64 %
Platelet Count: 244 10*3/uL (ref 150–400)
RBC: 3.32 MIL/uL — ABNORMAL LOW (ref 4.22–5.81)
RDW: 13.1 % (ref 11.5–15.5)
WBC Count: 4.3 10*3/uL (ref 4.0–10.5)
nRBC: 0 % (ref 0.0–0.2)

## 2023-11-07 LAB — TYPE AND SCREEN
ABO/RH(D): B POS
Antibody Screen: NEGATIVE

## 2023-11-07 MED ORDER — MONTELUKAST SODIUM 10 MG PO TABS
10.0000 mg | ORAL_TABLET | Freq: Once | ORAL | Status: AC
  Administered 2023-11-07: 10 mg via ORAL
  Filled 2023-11-07: qty 1

## 2023-11-07 MED ORDER — ACETAMINOPHEN 325 MG PO TABS
650.0000 mg | ORAL_TABLET | Freq: Once | ORAL | Status: AC
Start: 2023-11-07 — End: 2023-11-07
  Administered 2023-11-07: 650 mg via ORAL
  Filled 2023-11-07: qty 2

## 2023-11-07 MED ORDER — DARATUMUMAB-HYALURONIDASE-FIHJ 1800-30000 MG-UT/15ML ~~LOC~~ SOLN
1800.0000 mg | Freq: Once | SUBCUTANEOUS | Status: AC
  Administered 2023-11-07: 1800 mg via SUBCUTANEOUS
  Filled 2023-11-07: qty 15

## 2023-11-07 MED ORDER — DIPHENHYDRAMINE HCL 25 MG PO CAPS
50.0000 mg | ORAL_CAPSULE | Freq: Once | ORAL | Status: AC
  Administered 2023-11-07: 50 mg via ORAL
  Filled 2023-11-07: qty 2

## 2023-11-07 MED ORDER — DEXAMETHASONE 4 MG PO TABS
20.0000 mg | ORAL_TABLET | Freq: Once | ORAL | Status: AC
  Administered 2023-11-07: 20 mg via ORAL
  Filled 2023-11-07: qty 5

## 2023-11-07 NOTE — Telephone Encounter (Signed)
 Oral Chemotherapy Pharmacist Encounter  I spoke with patient for overview of: Revlimid for the treatment of multiple myeloma in conjunction with daratumumab and dexamethasone, planned duration for 6 cycles or until disease progression or unacceptable toxicity and dependent on response.   Counseled patient on administration, dosing, side effects, monitoring, drug-food interactions, safe handling, storage, and disposal.  Patient will take Revlimid 25mg  capsules, 1 capsule by mouth once daily, without regard to food, with a full glass of water. Revlimid will be given 21 days on, 7 days off, repeat every 28 days.  Revlimid start date: 11/07/2023  Adverse effects of Revlimid include but are not limited to: nausea, constipation, diarrhea, abdominal pain, rash, fatigue, drug fever, and decreased blood counts.    Reviewed with patient importance of keeping a medication schedule and plan for any missed doses. No barriers to medication adherence identified.  Medication reconciliation performed and medication/allergy list updated. Patient has picked up acyclovir prescription and will wait until cycle 1 day 1 prior to starting dexamethasone. Patient counseled on importance of daily aspirin 81mg  for VTE prophylaxis and has already started taking the baby aspirin.  Insurance authorization for Revlimid has been obtained. Revlimid prescription is being dispensed from [facility] pharmacy as it is a limited distribution medication.  All questions answered. Patient voiced understanding and appreciation.  Medication education handout placed in mail for patient. Patient knows to call the office with questions or concerns. Oral Chemotherapy Clinic phone number provided to patient.   Colin Calderon, PharmD Hematology/Oncology Clinical Pharmacist [facility] Oral Chemotherapy Navigation Clinic [phone] 11/07/2023    12:48 PM

## 2023-11-07 NOTE — Patient Instructions (Signed)
 [facility] - A DEPT OF [facility]  Discharge Instructions: Thank you for choosing [facility] to provide your oncology and hematology care.   If you have a lab appointment with the Cancer Center, please go directly to the Cancer Center and check in at the registration area.   Wear comfortable clothing and clothing appropriate for easy access to any Portacath or PICC line.   We strive to give you quality time with your provider. You may need to reschedule your appointment if you arrive late (15 or more minutes).  Arriving late affects you and other patients whose appointments are after yours.  Also, if you miss three or more appointments without notifying the office, you may be dismissed from the clinic at the provider's discretion.      For prescription refill requests, have your pharmacy contact our office and allow 72 hours for refills to be completed.    Today you received the following chemotherapy and/or immunotherapy agents Darzalex faspro      To help prevent nausea and vomiting after your treatment, we encourage you to take your nausea medication as directed.  BELOW ARE SYMPTOMS THAT SHOULD BE REPORTED IMMEDIATELY: *FEVER GREATER THAN 100.4 F (38 C) OR HIGHER *CHILLS OR SWEATING *NAUSEA AND VOMITING THAT IS NOT CONTROLLED WITH YOUR NAUSEA MEDICATION *UNUSUAL SHORTNESS OF BREATH *UNUSUAL BRUISING OR BLEEDING *URINARY PROBLEMS (pain or burning when urinating, or frequent urination) *BOWEL PROBLEMS (unusual diarrhea, constipation, pain near the anus) TENDERNESS IN MOUTH AND THROAT WITH OR WITHOUT PRESENCE OF ULCERS (sore throat, sores in mouth, or a toothache) UNUSUAL RASH, SWELLING OR PAIN  UNUSUAL VAGINAL DISCHARGE OR ITCHING   Items with * indicate a potential emergency and should be followed up as soon as possible or go to the Emergency Department if any problems should occur.  Please show the CHEMOTHERAPY ALERT CARD or  IMMUNOTHERAPY ALERT CARD at check-in to the Emergency Department and triage nurse.  Should you have questions after your visit or need to cancel or reschedule your appointment, please contact [facility] - A DEPT OF [facility]  Dept: [phone]  and follow the prompts.  Office hours are 8:00 a.m. to 4:30 p.m. Monday - Friday. Please note that voicemails left after 4:00 p.m. may not be returned until the following business day.  We are closed weekends and major holidays. You have access to a nurse at all times for urgent questions. Please call the main number to the clinic Dept: [phone] and follow the prompts.   For any non-urgent questions, you may also contact your provider using MyChart. We now offer e-Visits for anyone 40 and older to request care online for non-urgent symptoms. For details visit [email or web address].   Also download the MyChart app! Go to the app store, search "MyChart", open the app, select [place], and log in with your MyChart username and password.

## 2023-11-07 NOTE — Progress Notes (Signed)
 Pt observed for 120 minutes post Darzalex Faspro injection. Pt tolerated Tx well w/out incident. VSS at discharge.  Ambulatory to lobby.

## 2023-11-08 ENCOUNTER — Encounter: Payer: Self-pay | Admitting: Hematology

## 2023-11-08 LAB — PRETREATMENT RBC PHENOTYPE: DAT, IgG: NEGATIVE

## 2023-11-08 NOTE — Telephone Encounter (Signed)
-----   Message from Nurse Cortney P sent at 11/07/2023  2:39 PM EST ----- Regarding: Dr Candise Che first time Darzalex faspro Dr Candise Che, first time darzalex faspro. Patient tolerated treatment well

## 2023-11-08 NOTE — Telephone Encounter (Signed)
 Chemo Call Back.  Left message for pt to return call to Dr Clyda Greener RN to discuss how he is doing.

## 2023-11-15 ENCOUNTER — Inpatient Hospital Stay: Payer: PPO | Attending: Oncology

## 2023-11-15 ENCOUNTER — Inpatient Hospital Stay: Payer: PPO

## 2023-11-15 ENCOUNTER — Encounter: Payer: Self-pay | Admitting: Hematology

## 2023-11-15 ENCOUNTER — Inpatient Hospital Stay: Payer: PPO | Admitting: Hematology

## 2023-11-15 VITALS — BP 115/62 | HR 68 | Temp 99.0°F | Resp 17 | Ht 71.0 in | Wt 175.9 lb

## 2023-11-15 DIAGNOSIS — D539 Nutritional anemia, unspecified: Secondary | ICD-10-CM | POA: Diagnosis not present

## 2023-11-15 DIAGNOSIS — Z79899 Other long term (current) drug therapy: Secondary | ICD-10-CM | POA: Insufficient documentation

## 2023-11-15 DIAGNOSIS — C9000 Multiple myeloma not having achieved remission: Secondary | ICD-10-CM

## 2023-11-15 LAB — CMP (CANCER CENTER ONLY)
ALT: 10 U/L (ref 0–44)
AST: 9 U/L — ABNORMAL LOW (ref 15–41)
Albumin: 4.1 g/dL (ref 3.5–5.0)
Alkaline Phosphatase: 39 U/L (ref 38–126)
Anion gap: 3 — ABNORMAL LOW (ref 5–15)
BUN: 17 mg/dL (ref 8–23)
CO2: 31 mmol/L (ref 22–32)
Calcium: 8.3 mg/dL — ABNORMAL LOW (ref 8.9–10.3)
Chloride: 107 mmol/L (ref 98–111)
Creatinine: 0.98 mg/dL (ref 0.61–1.24)
GFR, Estimated: >60 mL/min (ref >60)
Glucose, Bld: 101 mg/dL — ABNORMAL HIGH (ref 70–99)
Potassium: 4.2 mmol/L (ref 3.5–5.1)
Sodium: 141 mmol/L (ref 135–145)
Total Bilirubin: 0.5 mg/dL (ref 0.0–1.2)
Total Protein: 5.8 g/dL — ABNORMAL LOW (ref 6.5–8.1)

## 2023-11-15 LAB — CBC WITH DIFFERENTIAL (CANCER CENTER ONLY)
Abs Immature Granulocytes: 0 10*3/uL (ref 0.00–0.07)
Basophils Absolute: 0 10*3/uL (ref 0.0–0.1)
Basophils Relative: 1 %
Eosinophils Absolute: 0.1 10*3/uL (ref 0.0–0.5)
Eosinophils Relative: 4 %
HCT: 32.9 % — ABNORMAL LOW (ref 39.0–52.0)
Hemoglobin: 10.9 g/dL — ABNORMAL LOW (ref 13.0–17.0)
Immature Granulocytes: 0 %
Lymphocytes Relative: 40 %
Lymphs Abs: 0.7 10*3/uL (ref 0.7–4.0)
MCH: 33.3 pg (ref 26.0–34.0)
MCHC: 33.1 g/dL (ref 30.0–36.0)
MCV: 100.6 fL — ABNORMAL HIGH (ref 80.0–100.0)
Monocytes Absolute: 0.1 10*3/uL (ref 0.1–1.0)
Monocytes Relative: 5 %
Neutro Abs: 0.9 10*3/uL — ABNORMAL LOW (ref 1.7–7.7)
Neutrophils Relative %: 50 %
Platelet Count: 227 10*3/uL (ref 150–400)
RBC: 3.27 MIL/uL — ABNORMAL LOW (ref 4.22–5.81)
RDW: 13.1 % (ref 11.5–15.5)
WBC Count: 1.8 10*3/uL — ABNORMAL LOW (ref 4.0–10.5)
nRBC: 0 % (ref 0.0–0.2)

## 2023-11-15 MED ORDER — MONTELUKAST SODIUM 10 MG PO TABS
10.0000 mg | ORAL_TABLET | Freq: Once | ORAL | Status: AC
  Administered 2023-11-15: 10 mg via ORAL
  Filled 2023-11-15: qty 1

## 2023-11-15 MED ORDER — DIPHENHYDRAMINE HCL 25 MG PO CAPS
50.0000 mg | ORAL_CAPSULE | Freq: Once | ORAL | Status: AC
  Administered 2023-11-15: 50 mg via ORAL
  Filled 2023-11-15: qty 2

## 2023-11-15 MED ORDER — ACETAMINOPHEN 325 MG PO TABS
650.0000 mg | ORAL_TABLET | Freq: Once | ORAL | Status: AC
  Administered 2023-11-15: 650 mg via ORAL
  Filled 2023-11-15: qty 2

## 2023-11-15 MED ORDER — DEXAMETHASONE 4 MG PO TABS
20.0000 mg | ORAL_TABLET | Freq: Once | ORAL | Status: AC
  Administered 2023-11-15: 20 mg via ORAL
  Filled 2023-11-15: qty 5

## 2023-11-15 MED ORDER — DARATUMUMAB-HYALURONIDASE-FIHJ 1800-30000 MG-UT/15ML ~~LOC~~ SOLN
1800.0000 mg | Freq: Once | SUBCUTANEOUS | Status: AC
  Administered 2023-11-15: 1800 mg via SUBCUTANEOUS
  Filled 2023-11-15: qty 15

## 2023-11-15 NOTE — Progress Notes (Signed)
 Patient seen by Dr. Addison Naegeli are within treatment parameters.  Labs reviewed: and are within treatment parameters.  Per physician team, patient is ready for treatment and there are NO modifications to the treatment plan.

## 2023-11-15 NOTE — Patient Instructions (Signed)

## 2023-11-15 NOTE — Progress Notes (Signed)
 Dr Candise Che aware: ANC: 0.9, WBC: 1.8

## 2023-11-15 NOTE — Progress Notes (Signed)
 HEMATOLOGY/ONCOLOGY CLINIC NOTE  Date of Service: 11/15/23  Patient Care Team: Daisy Floro, MD as PCP - General (Family Medicine) Jamey Ripa, Magda Paganini, MD as Referring Physician (Neurology)  CHIEF COMPLAINTS/PURPOSE OF CONSULTATION:  F/u for evaluation and management of newly diagnosed multiple myeloma  HISTORY OF PRESENTING ILLNESS:   Colin Calderon is a wonderful 73 y.o. male who has been referred to [place] by Hall Busing, MD for evaluation and management of abnormal protein.   He presented to the ED on 07/06/2023 for pneumonia of both lungs due to infectious organism.   Today, he is accompanied by his wife and friend. 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 His anemia is gradually worsening, previously hgb was11.2, 10 months ago and more recently 9.7 a week ago.   He reports that he was having some numbness in his hands and feet present for 2-4 weeks, which caused him to see a neurologist. As part of the neurologist's workup to see if his symptoms are related to paraproteinemia, there were findings of abnormal protein with M protein level of 0.2 g/dL. His wife reports that he has a nerve conduction study planned for December.   He denies any new fatigue, unexplained fever, chills, night sweats, or unexpected weight loss. He reports a 3-pound weight loss based on his scale at home.   He reports that he recently had pneumonia which was not unusual as he did have pneumonia previosuly. Patient notes that he did develop a fever causing him to present to the ED. Patient denies any SOB at this time, but does endorse sporadic coughing. He denies any issues with urinary infections or sinus infections.   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   He reports having a slightly enlarged prostate.   Patient reports having his thyroid checked with [facility] recently, but can't find the results. He reports a fhx of thyroid issues.   He denies any new leg  swelling, skin rashes, change in vision that is new or different, or headaches that are new or different. Patient denies any abdominal symptoms, such as abdominal pain or abdominal distention.    Patient denies having any chronic medical issues such as heart or lung issues.   He reports that he only takes B-12 replacement. Patient notes that he was previously B12 deficient in the past. He reports that the vitamin pills were not being absorbed.   Patient tends to be vegetarian for the most part, but does eats chicken occasionally.   He has no other medical issues.   He reports a surgical history of inguinal hernia surgery years ago most likely on the right side.   He reports an allergy to amoxicillin with rash reaction. He has not other medication allergies.   Patient does report a history of thyroid disorders in the family.  He reports that his brother passed away from leukemia. Patient reports that his father did have coronary issues 52  Mother 85 years  He is a never smoker and denies any alcohol consumption or use of other recreational chemicals.   Previously, patient was generally very active through activities including hiking, walking, and swimming. However, over the last couple of months he has endorsed new fatigue alongside worsening anemia. His wife reports that he has sometimes felt too tired to drive, which is unusual. She also noticed that he lags behind when they go for walks together over the last six months.   He complains of  poor sleep mostly due to urinary frequency, which is not a new symptom. 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 Patient reports very sensitive arms and legs. Patient reports that his mother did have "horrible" neuropathy and is constantly in pain.   His wife reports that patient previously had a loud voice and has lost his voice recently, which patient attributes to pneumonia. He denies any chest discomfort  at this time.   He did have thyroid function checked recently. Patient denies any obvious lumps/bumps.Patient denies any spine pain or abdominal pain.   INTERVAL HISTORY:  Colin Calderon is a wonderful 73 y.o. male who is here for continued evaluation and management of abnormal protein.  Patient was last seen by me on 10/18/2023 and complained of numbness and tingling in his bilateral upper extremities, grade 3 carpal tunnel syndrome limiting his motor function, constant thumb numbness, and loss of grip strength.   Today, he presents for toxicity check prior to cycle 1 day 8 of treatment. Patient is accompanied by his wife during this visit.   Patient reports that he started taking Revlimid the same day he received daratumumab.   Patient reports that he did receive steroid injections for his severe carpal tunnel which did improve symptoms. He reports that his right hand/upper extremity has mostly resolved. His left hand has also improved and he only has mild carpal tunnel issues around his left thumb.   He complains of sleeping issues, though he notes no particular issues last night. His wife reports that he does endorse nocturia every hour, but it unsure if it is necessarily causing his sleeping issues.   Patient denies any caffeine intake in the evenings. He generally drinks 50 ounces of water daily. Patient denies any leg swelling. He reports that he is able to completely empty his bladder urination and has fairly good urinary stream.   His wife reports patient had tingling/numbness of the tongue which began two days after treatment. Patient does not attribute tongue tingling to dry mouth.   Patient complains of persistent pain in the lower jaw since his dental cleaning visit 3 weeks ago, which had improved more recently. Patient reports that he was told by his dentist that he had ulcers in the back of his mouth, possibly a herpetic sore. There were no other concerns on dental x-ray. Patient  reports that he does not tend to endorse Aphthous ulcers.   Patient reports that he does take vitamin B12. He is not taking vitamin D supplementation.   Patient denies any uncontrolled nausea/vomiting, diarrhea, chills, or rigors.   He reports redness/facial flushing which he describes as a sensation of ants crawling on his head. This symptom eventually resolved with Benadryl use.   Patient reports thick swelling of the tongue limiting his talking ability. He also complains of loss of voice.   His wife reports that patient endorses itchiness in his head.   In regards to physical activity, his wife reports that patient walked for 4 miles along with her this week.   MEDICAL HISTORY:  No past medical history on file.  SURGICAL HISTORY: Past Surgical History:  Procedure Laterality Date   HERNIA REPAIR      SOCIAL HISTORY: Social History   Socioeconomic History   Marital status: Married    Spouse name: Not on file   Number of children: Not on file   Years of education: Not on file   Highest education level:  Not on file  Occupational History   Not on file  Tobacco Use   Smoking status: Never   Smokeless tobacco: Not on file  Substance and Sexual Activity   Alcohol use: Yes   Drug use: No   Sexual activity: Not on file  Other Topics Concern   Not on file  Social History Narrative   Not on file   Social Drivers of Health   Financial Resource Strain: Low Risk  (07/01/2023)   Overall Financial Resource Strain (CARDIA)    Difficulty of Paying Living Expenses: Not hard at all  Food Insecurity: Low Risk  (10/16/2023)   Received from [facility]   Hunger Vital Sign    Worried About Running Out of Food in the Last Year: Never true    Ran Out of Food in the Last Year: Never true  Transportation Needs: No Transportation Needs (10/16/2023)   Received from [facility]    In the past 12 months, has lack of reliable transportation kept you from medical  appointments, meetings, work or from getting things needed for daily living? : No  Physical Activity: Sufficiently Active (07/01/2023)   Exercise Vital Sign    Days of Exercise per Week: 7 days    Minutes of Exercise per Session: 40 min  Stress: No Stress Concern Present (07/01/2023)   [employer] of Occupational Health - Occupational Stress Questionnaire    Feeling of Stress : Not at all  Social Connections: Socially Integrated (07/01/2023)   Social Connection and Isolation Panel [NHANES]    Frequency of Communication with Friends and Family: Not on file    Frequency of Social Gatherings with Friends and Family: Three times a week    Attends Religious Services: More than 4 times per year    Active Member of Clubs or Organizations: Yes    Attends Banker Meetings: More than 4 times per year    Marital Status: Married  Catering manager Violence: Not At Risk (07/01/2023)   Humiliation, Afraid, Rape, and Kick questionnaire    Fear of Current or Ex-Partner: No    Emotionally Abused: No    Physically Abused: No    Sexually Abused: No    FAMILY HISTORY: No family history on file.  ALLERGIES:  is allergic to amoxicillin.  MEDICATIONS:  Current Outpatient Medications  Medication Sig Dispense Refill   acyclovir (ZOVIRAX) 400 MG tablet Take 1 tablet (400 mg total) by mouth 2 (two) times daily. 60 tablet 5   aspirin EC 81 MG tablet Take 1 tablet (81 mg total) by mouth daily. 100 tablet 3   azithromycin (ZITHROMAX) 250 MG tablet Take 1 tablet (250 mg total) by mouth daily. Take first 2 tablets together, then 1 every day until finished. (Patient not taking: Reported on 11/07/2023) 6 tablet 0   benzonatate (TESSALON) 100 MG capsule Take 1 capsule (100 mg total) by mouth every 8 (eight) hours. (Patient not taking: Reported on 11/07/2023) 21 capsule 0   Cobalamin Combinations (B-12) [phone] MCG SUBL Take 1 tablet by mouth daily.     Cyanocobalamin 5000 MCG/ML LIQD 1 tablet 1  day or 1 dose.     dexamethasone (DECADRON) 4 MG tablet Take 5 tabs (20 mg) weekly the day after daratumumab for 12 weeks. Take with breakfast. 20 tablet 5   lenalidomide (REVLIMID) 25 MG capsule Take 1 capsule (25 mg total) by mouth daily. Take for 21 days on, 7 days off, repeat every 21 days x 6.  21 capsule 0   ondansetron (ZOFRAN) 8 MG tablet Take 1 tablet (8 mg total) by mouth every 8 (eight) hours as needed for nausea or vomiting. 30 tablet 1   prochlorperazine (COMPAZINE) 10 MG tablet Take 1 tablet (10 mg total) by mouth every 6 (six) hours as needed for nausea or vomiting. 30 tablet 1   No current facility-administered medications for this visit.    REVIEW OF SYSTEMS:    10 Point review of Systems was done is negative except as noted above.   PHYSICAL EXAMINATION: ECOG PERFORMANCE STATUS: 1 - Symptomatic but completely ambulatory  . There were no vitals filed for this visit.  There were no vitals filed for this visit.  .There is no height or weight on file to calculate BMI.   GENERAL:alert, in no acute distress and comfortable SKIN: no acute rashes, no significant lesions EYES: conjunctiva are pink and non-injected, sclera anicteric OROPHARYNX: MMM, no exudates, no oropharyngeal erythema or ulceration NECK: supple, no JVD LYMPH:  no palpable lymphadenopathy in the cervical, axillary or inguinal regions LUNGS: clear to auscultation b/l with normal respiratory effort HEART: regular rate & rhythm ABDOMEN:  normoactive bowel sounds , non tender, not distended. Extremity: no pedal edema PSYCH: alert & oriented x 3 with fluent speech NEURO: no focal motor/sensory deficits   LABORATORY DATA:  I have reviewed the data as listed  .    Latest Ref Rng & Units 11/07/2023   10:32 AM 09/20/2023    2:26 PM 07/17/2023    8:10 AM  CBC  WBC 4.0 - 10.5 K/uL 4.3  4.4  3.8   Hemoglobin 13.0 - 17.0 g/dL 16.1  09.6  9.7   Hematocrit 39.0 - 52.0 % 33.3  31.7  29.4   Platelets 150 - 400  K/uL 244  261  426     .    Latest Ref Rng & Units 11/07/2023   10:32 AM 09/20/2023    2:26 PM 07/06/2023   12:45 PM  CMP  Glucose 70 - 99 mg/dL 045  409  96   BUN 8 - 23 mg/dL 20  20  19    Creatinine 0.61 - 1.24 mg/dL 8.11  9.14  7.82   Sodium 135 - 145 mmol/L 142  141  138   Potassium 3.5 - 5.1 mmol/L 4.7  4.4  4.3   Chloride 98 - 111 mmol/L 108  107  102   CO2 22 - 32 mmol/L 31  30  26    Calcium 8.9 - 10.3 mg/dL 9.4  9.3  9.4   Total Protein 6.5 - 8.1 g/dL 6.2  6.2    Total Bilirubin 0.0 - 1.2 mg/dL 0.4  0.4    Alkaline Phos 38 - 126 U/L 40  39    AST 15 - 41 U/L 12  16    ALT 0 - 44 U/L 8  12       07/17/2023 Surgical pathology:   07/17/2023 Surgical pathology:      RADIOGRAPHIC STUDIES: I have personally reviewed the radiological images as listed and agreed with the findings in the report. No results found.  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 Newly diagnosed Multiple myeloma -Kappa light chain producing.  BM Bx showing  a second population of lymphoplasmacytoid cells which are 90% of the lymphoplasmacytoid cells which are CD20+ve and neg for CD5 and CD10 and show Kappa light chain restriction. 3. 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  4. 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 CBC showed WBC of 1.8K, hemoglobin of 10.9, and platelets of 227K. -hgb stable -platelets remain normal -his WBCs are low, as might be expected with revlimid.  -there is mild neutropenia with neutrophils low at 0.9 K/uL. Discussed that there is some increased risk of infection at this time, which can vary  throughout the treatment cycle. If hit neutrophils are persistently low, there may be a role to hold revlimid for one week to allow his bone marrow to bounce back -discussed CMP lab results in detail -patient has tolerated daratumumab/revlimid/dexamethasone treatment regimen without any major toxicities and he shall continue -will continue to hold velcade at this time due to carpal tunnel syndrome -discussed that if his carpal tunnel continues to resolve, we shall decide whether or not there may be a role to add Velcade with his next cycle of treatment or not -discussed that it is possible that he may have traumatic ulcers from dental cleaning -he is unlikely to have herpetic ulcers this early into his treatment -discussed that his symptoms of "ants crawling" on head is likely due to a combination of Benadryl and steroids -voice changes are likely due to temporary effects of steroids -educated patient that sudden feelings of feeling flushed, tingling, jitteriness, and itching is likely from high-dose steroids -recommend taking one capsule of B complex daily to ensure that there are no subtle deficiencies -recommend taking at least 2000 units of vitamin D3 daily for bone strength, immune system, and for support from a myeloma standpoint -continue vitamin B12 supplementation -recommend drinking 2L of water daily, mostly during the earlier part of the day, especially to keep the kidneys flushed -educated patient that Revlimid can cause some potassium loss. His potassium is normal at this time. Recommend consuming potassium-rich foods such as coconut water, orange juice, and bananas -continue to stay physically active at an equivalence to brisk walking for 30 minutes daily to improve fatigue -continue neutropenic precautions including avoiding crowds and wearing masks in public settings -discussed option of connecting patient with counseling resources to ensure that he is able to express himself. Patient  is not inclined to try at this time.  -answered all of patient's and his wife's questions in detail including in regards to taking neutropenic precautionary measures and using protection during sexual activity.  -recommend using protection during intimacy  -generally would recommend avoiding swimming in swimming pools during the flu season -will continue to monitor blood counts once a week  FOLLOW-UP: -Please schedule remaining C1 and C2 of Daratumumab with labs -MD visit in 2 weeks  The total time spent in the appointment was *** minutes* .  All of the patient's questions were answered with apparent satisfaction. The patient knows to call the clinic with any problems, questions or concerns.   Wyvonnia Lora MD MS AAHIVMS [facility] Hematology/Oncology Physician [facility]  .*Total Encounter Time as defined by the Centers for Medicare and Medicaid Services includes, in addition to the face-to-face time of a patient visit (documented in the note above) non-face-to-face time: obtaining and reviewing outside history, ordering and reviewing medications, tests or procedures, care coordination (communications with other health  care professionals or caregivers) and documentation in the medical record.    I,Mitra Faeizi,acting as a Neurosurgeon for Wyvonnia Lora, MD.,have documented all relevant documentation on the behalf of Wyvonnia Lora, MD,as directed by  Wyvonnia Lora, MD while in the presence of Wyvonnia Lora, MD.  ***

## 2023-11-19 ENCOUNTER — Other Ambulatory Visit: Payer: Self-pay

## 2023-11-19 DIAGNOSIS — C9000 Multiple myeloma not having achieved remission: Secondary | ICD-10-CM | POA: Diagnosis not present

## 2023-11-19 DIAGNOSIS — G4719 Other hypersomnia: Secondary | ICD-10-CM | POA: Diagnosis not present

## 2023-11-20 ENCOUNTER — Other Ambulatory Visit: Payer: Self-pay

## 2023-11-21 ENCOUNTER — Encounter: Payer: Self-pay | Admitting: Hematology

## 2023-11-21 ENCOUNTER — Inpatient Hospital Stay: Payer: PPO

## 2023-11-21 ENCOUNTER — Inpatient Hospital Stay (HOSPITAL_BASED_OUTPATIENT_CLINIC_OR_DEPARTMENT_OTHER): Payer: PPO

## 2023-11-21 VITALS — BP 118/68 | HR 58 | Temp 98.1°F | Resp 16

## 2023-11-21 DIAGNOSIS — C9000 Multiple myeloma not having achieved remission: Secondary | ICD-10-CM | POA: Diagnosis not present

## 2023-11-21 LAB — CBC WITH DIFFERENTIAL (CANCER CENTER ONLY)
Abs Immature Granulocytes: 0 10*3/uL (ref 0.00–0.07)
Basophils Absolute: 0 10*3/uL (ref 0.0–0.1)
Basophils Relative: 1 %
Eosinophils Absolute: 0.1 10*3/uL (ref 0.0–0.5)
Eosinophils Relative: 6 %
HCT: 30.4 % — ABNORMAL LOW (ref 39.0–52.0)
Hemoglobin: 10.4 g/dL — ABNORMAL LOW (ref 13.0–17.0)
Immature Granulocytes: 0 %
Lymphocytes Relative: 67 %
Lymphs Abs: 0.7 10*3/uL (ref 0.7–4.0)
MCH: 33 pg (ref 26.0–34.0)
MCHC: 34.2 g/dL (ref 30.0–36.0)
MCV: 96.5 fL (ref 80.0–100.0)
Monocytes Absolute: 0.1 10*3/uL (ref 0.1–1.0)
Monocytes Relative: 7 %
Neutro Abs: 0.2 10*3/uL — CL (ref 1.7–7.7)
Neutrophils Relative %: 19 %
Platelet Count: 223 10*3/uL (ref 150–400)
RBC: 3.15 MIL/uL — ABNORMAL LOW (ref 4.22–5.81)
RDW: 13.1 % (ref 11.5–15.5)
WBC Count: 1 10*3/uL — ABNORMAL LOW (ref 4.0–10.5)
nRBC: 0 % (ref 0.0–0.2)

## 2023-11-21 MED ORDER — MONTELUKAST SODIUM 10 MG PO TABS
10.0000 mg | ORAL_TABLET | Freq: Once | ORAL | Status: AC
  Administered 2023-11-21: 10 mg via ORAL
  Filled 2023-11-21: qty 1

## 2023-11-21 MED ORDER — DIPHENHYDRAMINE HCL 25 MG PO CAPS
50.0000 mg | ORAL_CAPSULE | Freq: Once | ORAL | Status: AC
Start: 2023-11-21 — End: 2023-11-21
  Administered 2023-11-21: 50 mg via ORAL
  Filled 2023-11-21: qty 2

## 2023-11-21 MED ORDER — ACETAMINOPHEN 325 MG PO TABS
650.0000 mg | ORAL_TABLET | Freq: Once | ORAL | Status: AC
  Administered 2023-11-21: 650 mg via ORAL
  Filled 2023-11-21: qty 2

## 2023-11-21 MED ORDER — DARATUMUMAB-HYALURONIDASE-FIHJ 1800-30000 MG-UT/15ML ~~LOC~~ SOLN
1800.0000 mg | Freq: Once | SUBCUTANEOUS | Status: AC
  Administered 2023-11-21: 1800 mg via SUBCUTANEOUS
  Filled 2023-11-21: qty 15

## 2023-11-21 MED ORDER — DEXAMETHASONE 4 MG PO TABS
20.0000 mg | ORAL_TABLET | Freq: Once | ORAL | Status: AC
Start: 2023-11-21 — End: 2023-11-21
  Administered 2023-11-21: 20 mg via ORAL
  Filled 2023-11-21: qty 5

## 2023-11-21 NOTE — Progress Notes (Signed)
 Dr Candise Che notified ANC: 0.2. Per Der Candise Che pt to hold Revlimid until next lab draw and okay to precede with tx today. Pt notified to hold Revlimid and verbalized understanding.

## 2023-11-21 NOTE — Patient Instructions (Signed)
 [facility] - A DEPT OF [facility]  Discharge Instructions: Thank you for choosing [facility] to provide your oncology and hematology care.   If you have a lab appointment with the Cancer Center, please go directly to the Cancer Center and check in at the registration area.   Wear comfortable clothing and clothing appropriate for easy access to any Portacath or PICC line.   We strive to give you quality time with your provider. You may need to reschedule your appointment if you arrive late (15 or more minutes).  Arriving late affects you and other patients whose appointments are after yours.  Also, if you miss three or more appointments without notifying the office, you may be dismissed from the clinic at the provider's discretion.      For prescription refill requests, have your pharmacy contact our office and allow 72 hours for refills to be completed.    Today you received the following chemotherapy and/or immunotherapy agents: Darzalex Faspro      To help prevent nausea and vomiting after your treatment, we encourage you to take your nausea medication as directed.  BELOW ARE SYMPTOMS THAT SHOULD BE REPORTED IMMEDIATELY: *FEVER GREATER THAN 100.4 F (38 C) OR HIGHER *CHILLS OR SWEATING *NAUSEA AND VOMITING THAT IS NOT CONTROLLED WITH YOUR NAUSEA MEDICATION *UNUSUAL SHORTNESS OF BREATH *UNUSUAL BRUISING OR BLEEDING *URINARY PROBLEMS (pain or burning when urinating, or frequent urination) *BOWEL PROBLEMS (unusual diarrhea, constipation, pain near the anus) TENDERNESS IN MOUTH AND THROAT WITH OR WITHOUT PRESENCE OF ULCERS (sore throat, sores in mouth, or a toothache) UNUSUAL RASH, SWELLING OR PAIN  UNUSUAL VAGINAL DISCHARGE OR ITCHING   Items with * indicate a potential emergency and should be followed up as soon as possible or go to the Emergency Department if any problems should occur.  Please show the CHEMOTHERAPY ALERT CARD or  IMMUNOTHERAPY ALERT CARD at check-in to the Emergency Department and triage nurse.  Should you have questions after your visit or need to cancel or reschedule your appointment, please contact [facility] - A DE[facility]  Dept: [phone]  and follow the prompts.  Office hours are 8:00 a.m. to 4:30 p.m. Monday - Friday. Please note that voicemails left after 4:00 p.m. may not be returned until the following business day.  We are closed weekends and major holidays. You have access to a nurse at all times for urgent questions. Please call the main number to the clinic Dept: [phone] and follow the prompts.   For any non-urgent questions, you may also contact your provider using MyChart. We now offer e-Visits for anyone 82 and older to request care online for non-urgent symptoms. For details visit [email or web address].   Also download the MyChart app! Go to the app store, search "MyChart", open the app, select [place], and log in with your MyChart username and password.  Neutropenia Neutropenia is a condition that occurs when you have low levels of neutrophils. Neutrophils are a type of white blood cells. They are made in the spongy center of bones (bone marrow). They fight infections. Neutrophils are your body's main defense against infections. The fewer neutrophils you have and the longer your body remains without them, the greater your risk of getting a severe infection. What are the causes? This condition can occur if your body uses up or destroys neutrophils faster than your bone marrow can make them. Neutropenia  may be caused by: A bacterial or fungal infection. Allergic disorders. Reactions to some medicines. An autoimmune disease. An enlarged spleen. This condition can also occur if your bone marrow does not produce enough neutrophils. This problem may be caused by: Cancer. Cancer treatments, such as radiation or chemotherapy. Viral  infections. Medicines, such as phenytoin. Vitamin B12 deficiency. Diseases of the bone marrow. Environmental toxins, such as insecticides. What are the signs or symptoms? This condition does not usually cause symptoms. If symptoms are present, they are usually caused by an underlying infection. Symptoms of an infection may include: Fever. Chills. Swollen glands. Mouth ulcers. Cough. Rash or skin infection. Skin may be red, swollen, or painful. Abdominal or rectal pain. Frequent urination or pain or burning with urination. Because neutropenia weakens the immune system, symptoms of infection may be reduced. It is important to be aware of any changes in your body and talk to your health care provider. How is this diagnosed? This condition is diagnosed based on your medical history and a physical exam. Tests will also be done, such as: A complete blood count (CBC). Bone marrow biopsy. This is collecting a sample of bone marrow for testing. A chest X-ray. A urine culture. A blood culture. How is this treated? Treatment depends on the underlying cause and severity of your condition. Mild neutropenia may not require treatment. Treatment may include medicines, such as: Antibiotic medicine given through an IV. Antiviral medicines. Antifungal medicines. A medicine to increase production of neutrophils (colony-stimulating factor). You may get this medicine through an IV or by injection. Steroids given through an IV. If an underlying condition is causing neutropenia, you may need treatment for that condition. If medicines or cancer treatments are causing neutropenia, your health care provider may have you stop the medicines or treatment. Follow these instructions at home: Medicines  Take over-the-counter and prescription medicines only as told by your health care provider. Get an annual flu shot. Ask your health care provider whether you or anyone you live with needs any other  vaccines. Eating and drinking Do not share food utensils. Do not eat unpasteurized foods. Do not eat raw or undercooked meat, eggs, or seafood. Do not eat unwashed, raw fruits or vegetables. Lifestyle Avoid exposure to groups of people or children. Avoid being around people who are sick. Avoid being around live plants or fresh flowers. Avoid being around dirt or dust, such as in construction areas or gardens. Wear gloves if you are going to do yard work or gardening. Do not provide direct care for pets. Avoid animal droppings. Do not clean litter boxes and bird cages. Do not have sex unless your health care provider has approved. Hygiene  Bathe daily. Clean the area between the genitals and the anus (perineal area) after you urinate or have a bowel movement. If you are male, wipe from front to back. Get regular dental care and brush your teeth with a soft toothbrush before and after meals. Do not use a regular razor. Use an electric razor to remove hair. Wash your hands often with soap and water for at least 20 seconds. Make sure others who come in contact with you also wash their hands. If soap and water are not available, use hand sanitizer. General instructions Take steps to reduce your risk of injury or infection. Follow any precautions as told by your health care provider. Take actions to avoid cuts and burns. For example: Be cautious when you use knives. Always cut away from yourself. Keep knives  in protective sheaths or guards when not in use. Use oven mitts when you cook with a hot stove, oven, or grill. Stand a safe distance away from open fires. Do not use tampons, enemas, or rectal suppositories unless your health care provider has approved. Keep all follow-up visits. This is important. Contact a health care provider if: You have a cough. You have a sore throat. You develop sores in your mouth or anus. You have a warm, red, or tender area on your skin. You have red  streaks on the skin. You develop a rash. You have swollen lymph nodes. You have frequent or painful urination. You have vaginal discharge or itching. Get help right away if: You have a fever. You have chills or shaking. You have nausea or vomiting. You have a lot of fatigue. You have shortness of breath. Summary Neutropenia is a condition that occurs when you have a lower-than-normal level of a type of white blood cell (neutrophils) in your body. This condition can occur if your body uses up or destroys neutrophils faster than your bone marrow can make them. Treatment depends on the underlying cause and severity of your condition. Mild neutropenia may not require treatment. Follow any precautions as told by your health care provider to reduce your risk for injury or infection. This information is not intended to replace advice given to you by your health care provider. Make sure you discuss any questions you have with your health care provider. Document Revised: 02/22/2021 Document Reviewed: 02/22/2021 Elsevier Patient Education  2024 [employer].

## 2023-11-21 NOTE — Progress Notes (Signed)
 Per Dr. Candise Che, OK to treat today with ANC 0.2.

## 2023-11-26 DIAGNOSIS — L821 Other seborrheic keratosis: Secondary | ICD-10-CM | POA: Diagnosis not present

## 2023-11-26 DIAGNOSIS — D229 Melanocytic nevi, unspecified: Secondary | ICD-10-CM | POA: Diagnosis not present

## 2023-11-26 DIAGNOSIS — D1801 Hemangioma of skin and subcutaneous tissue: Secondary | ICD-10-CM | POA: Diagnosis not present

## 2023-11-26 DIAGNOSIS — L578 Other skin changes due to chronic exposure to nonionizing radiation: Secondary | ICD-10-CM | POA: Diagnosis not present

## 2023-11-26 DIAGNOSIS — L814 Other melanin hyperpigmentation: Secondary | ICD-10-CM | POA: Diagnosis not present

## 2023-11-28 ENCOUNTER — Other Ambulatory Visit: Payer: Self-pay

## 2023-11-28 ENCOUNTER — Other Ambulatory Visit: Payer: Self-pay | Admitting: Hematology

## 2023-11-28 DIAGNOSIS — C9000 Multiple myeloma not having achieved remission: Secondary | ICD-10-CM

## 2023-11-28 MED ORDER — LENALIDOMIDE 15 MG PO CAPS: 15.0000 mg | ORAL_CAPSULE | Freq: Every day | ORAL | 0 refills | Status: DC

## 2023-11-29 ENCOUNTER — Other Ambulatory Visit: Payer: Self-pay

## 2023-11-29 ENCOUNTER — Inpatient Hospital Stay: Payer: PPO

## 2023-11-29 ENCOUNTER — Ambulatory Visit

## 2023-11-29 ENCOUNTER — Inpatient Hospital Stay (HOSPITAL_BASED_OUTPATIENT_CLINIC_OR_DEPARTMENT_OTHER): Payer: PPO | Admitting: Hematology

## 2023-11-29 VITALS — BP 119/68 | HR 53 | Temp 97.9°F | Resp 16 | Ht 71.0 in | Wt 174.8 lb

## 2023-11-29 DIAGNOSIS — C9000 Multiple myeloma not having achieved remission: Secondary | ICD-10-CM | POA: Diagnosis not present

## 2023-11-29 DIAGNOSIS — Z5111 Encounter for antineoplastic chemotherapy: Secondary | ICD-10-CM

## 2023-11-29 LAB — CBC WITH DIFFERENTIAL (CANCER CENTER ONLY)
Abs Immature Granulocytes: 0 10*3/uL (ref 0.00–0.07)
Basophils Absolute: 0 10*3/uL (ref 0.0–0.1)
Basophils Relative: 1 %
Eosinophils Absolute: 0 10*3/uL (ref 0.0–0.5)
Eosinophils Relative: 1 %
HCT: 31.3 % — ABNORMAL LOW (ref 39.0–52.0)
Hemoglobin: 10.6 g/dL — ABNORMAL LOW (ref 13.0–17.0)
Immature Granulocytes: 0 %
Lymphocytes Relative: 65 %
Lymphs Abs: 1 10*3/uL (ref 0.7–4.0)
MCH: 33 pg (ref 26.0–34.0)
MCHC: 33.9 g/dL (ref 30.0–36.0)
MCV: 97.5 fL (ref 80.0–100.0)
Monocytes Absolute: 0.2 10*3/uL (ref 0.1–1.0)
Monocytes Relative: 13 %
Neutro Abs: 0.3 10*3/uL — CL (ref 1.7–7.7)
Neutrophils Relative %: 20 %
Platelet Count: 168 10*3/uL (ref 150–400)
RBC: 3.21 MIL/uL — ABNORMAL LOW (ref 4.22–5.81)
RDW: 13.2 % (ref 11.5–15.5)
WBC Count: 1.6 10*3/uL — ABNORMAL LOW (ref 4.0–10.5)
nRBC: 0 % (ref 0.0–0.2)

## 2023-11-29 LAB — CMP (CANCER CENTER ONLY)
ALT: 13 U/L (ref 0–44)
AST: 11 U/L — ABNORMAL LOW (ref 15–41)
Albumin: 4 g/dL (ref 3.5–5.0)
Alkaline Phosphatase: 41 U/L (ref 38–126)
Anion gap: 4 — ABNORMAL LOW (ref 5–15)
BUN: 23 mg/dL (ref 8–23)
CO2: 28 mmol/L (ref 22–32)
Calcium: 8.3 mg/dL — ABNORMAL LOW (ref 8.9–10.3)
Chloride: 109 mmol/L (ref 98–111)
Creatinine: 0.91 mg/dL (ref 0.61–1.24)
GFR, Estimated: >60 mL/min (ref >60)
Glucose, Bld: 87 mg/dL (ref 70–99)
Potassium: 4.7 mmol/L (ref 3.5–5.1)
Sodium: 141 mmol/L (ref 135–145)
Total Bilirubin: 0.4 mg/dL (ref 0.0–1.2)
Total Protein: 5.6 g/dL — ABNORMAL LOW (ref 6.5–8.1)

## 2023-11-29 MED ORDER — DARATUMUMAB-HYALURONIDASE-FIHJ 1800-30000 MG-UT/15ML ~~LOC~~ SOLN
1800.0000 mg | Freq: Once | SUBCUTANEOUS | Status: AC
  Administered 2023-11-29: 1800 mg via SUBCUTANEOUS
  Filled 2023-11-29: qty 15

## 2023-11-29 MED ORDER — DIPHENHYDRAMINE HCL 25 MG PO CAPS
50.0000 mg | ORAL_CAPSULE | Freq: Once | ORAL | Status: AC
  Administered 2023-11-29: 50 mg via ORAL
  Filled 2023-11-29: qty 2

## 2023-11-29 MED ORDER — MONTELUKAST SODIUM 10 MG PO TABS
10.0000 mg | ORAL_TABLET | Freq: Once | ORAL | Status: AC
  Administered 2023-11-29: 10 mg via ORAL
  Filled 2023-11-29: qty 1

## 2023-11-29 MED ORDER — DEXAMETHASONE 4 MG PO TABS
20.0000 mg | ORAL_TABLET | Freq: Once | ORAL | Status: AC
  Administered 2023-11-29: 20 mg via ORAL
  Filled 2023-11-29: qty 5

## 2023-11-29 MED ORDER — ACETAMINOPHEN 325 MG PO TABS
650.0000 mg | ORAL_TABLET | Freq: Once | ORAL | Status: AC
  Administered 2023-11-29: 650 mg via ORAL
  Filled 2023-11-29: qty 2

## 2023-11-29 MED ORDER — DEXAMETHASONE 4 MG PO TABS: ORAL_TABLET | ORAL | 5 refills | Status: DC

## 2023-11-29 NOTE — Progress Notes (Signed)
 HEMATOLOGY/ONCOLOGY CLINIC NOTE  Date of Service: 11/29/23  Patient Care Team: Daisy Floro, MD as PCP - General (Family Medicine) Jamey Ripa, Magda Paganini, MD as Referring Physician (Neurology)  CHIEF COMPLAINTS/PURPOSE OF CONSULTATION:  F/u for evaluation and management of newly diagnosed multiple myeloma  HISTORY OF PRESENTING ILLNESS:   Colin Calderon is a wonderful 73 y.o. male who has been referred to [place] by Hall Busing, MD for evaluation and management of abnormal protein.   He presented to the ED on 07/06/2023 for pneumonia of both lungs due to infectious organism.   Today, he is accompanied by his wife and friend. 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 His anemia is gradually worsening, previously hgb was11.2, 10 months ago and more recently 9.7 a week ago.   He reports that he was having some numbness in his hands and feet present for 2-4 weeks, which caused him to see a neurologist. As part of the neurologist's workup to see if his symptoms are related to paraproteinemia, there were findings of abnormal protein with M protein level of 0.2 g/dL. His wife reports that he has a nerve conduction study planned for December.   He denies any new fatigue, unexplained fever, chills, night sweats, or unexpected weight loss. He reports a 3-pound weight loss based on his scale at home.   He reports that he recently had pneumonia which was not unusual as he did have pneumonia previosuly. Patient notes that he did develop a fever causing him to present to the ED. Patient denies any SOB at this time, but does endorse sporadic coughing. He denies any issues with urinary infections or sinus infections.   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   He reports having a slightly enlarged prostate.   Patient reports having his [facility] checked with [facility], but can't find the results. He reports a fhx of thyroid issues.   He denies any new leg  swelling, skin rashes, change in vision that is new or different, or headaches that are new or different. Patient denies any abdominal symptoms, such as abdominal pain or abdominal distention.    Patient denies having any chronic medical issues such as heart or lung issues.   He reports that he only takes B-12 replacement. Patient notes that he was previously B12 deficient in the past. He reports that the vitamin pills were not being absorbed.   Patient tends to be vegetarian for the most part, but does eats chicken occasionally.   He has no other medical issues.   He reports a surgical history of inguinal hernia surgery years ago most likely on the right side.   He reports an allergy to amoxicillin with rash reaction. He has not other medication allergies.   Patient does report a history of thyroid disorders in the family.  He reports that his brother passed away from leukemia. Patient reports that his father did have coronary issues 54  Mother 31 years  He is a never smoker and denies any alcohol consumption or use of other recreational chemicals.   Previously, patient was generally very active through activities including hiking, walking, and swimming. However, over the last couple of months he has endorsed new fatigue alongside worsening anemia. His wife reports that he has sometimes felt too tired to drive, which is unusual. She also noticed that he lags behind when they go for walks together over the last six months.   He complains of  poor sleep mostly due to urinary frequency, which is not a new symptom. 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 Patient reports very sensitive arms and legs. Patient reports that his mother did have "horrible" neuropathy and is constantly in pain.   His wife reports that patient previously had a loud voice and has lost his voice recently, which patient attributes to pneumonia. He denies any chest discomfort  at this time.   He did have thyroid function checked recently. Patient denies any obvious lumps/bumps.Patient denies any spine pain or abdominal pain.   INTERVAL HISTORY:  Colin Calderon is a wonderful 73 y.o. male who is here for continued evaluation and management of abnormal protein.  Patient was last seen by me on 11/15/2023 and reported mild carpal tunnel issues around his left thumb, sleeping issues, nocturia, tingling/numbness of the tongue which began two days after treatment, improved persistent pain in the lower jaw since his dental cleaning visit 3 prior to the visit, thick swelling of the tongue limiting his talking ability, loss of voice, and  itchiness in his head. He also reported redness/facial flushing, which eventually resolved with Benadryl use.   Today, he presents for toxicity check prior to cycle 1 day 22 of treatment. Patient is accompanied by his wife during this visit.   Patient reports that he started taking multiple supplements, including vitamin D and B complex supplements.   He reports that prior to last week, his injection caused him to start to develop a cough which eventually settled down. Patient denies endorsing any seasonal allergies.   He denies any fever, itching of the scalp  Patient reports having a skin rash on the left side of his trunk which lasted 3 days and initially presented within 24 hours of his injection. The skin rash was red and he reports that the rash appeared similar to eczema. Patient did not have skin rash issues with previous injections. He notes that the injection site was pinched this time, though it had not previously. He denies any skin rash in the abdomen at this time.   He complains that his familial tremors are worse in general. He reports that his symptoms are not bothersome enough to consider treatment such as beta blockers. He reports that he has not been on beta blocker previously.   In regards to his carpal tunnel, he denies  any major issues in the right hand. His left hand is mostly okay though he continues to endorse mild pain issues with his left thumb, which is not significantly limiting.   Patient denies any nausea or abdominal pain. He reports that his mouth sores and pain in the jaw has resolved. His wife reports patient previously endorsed black and blue discoloration in his lip, which has resolved.   Patient reports that when wearing a winter coat, he does tend to have bilateral shoulder joint pains, which is a new symptom. He notes that he no longer has pain in his arms at this time. He attributes his shoulder pain to the weight of his arms. His shoulder pain is not persistent and elevating his arms does improve symptoms.   Patient has been eating well and tries to drink water as much as possible. His weight in clinic today is 174 pounds.   He reports that his sleeping habits have improved over the last few days.   He notes that he is needing a refill on steroids.   Patient generally  walks outdoors daily with his wife.   Patient reports some emotional challenges in regards to involved process of receiving cancer treatment.   MEDICAL HISTORY:  No past medical history on file.  SURGICAL HISTORY: Past Surgical History:  Procedure Laterality Date   HERNIA REPAIR      SOCIAL HISTORY: Social History   Socioeconomic History   Marital status: Married    Spouse name: Not on file   Number of children: Not on file   Years of education: Not on file   Highest education level: Not on file  Occupational History   Not on file  Tobacco Use   Smoking status: Never   Smokeless tobacco: Not on file  Substance and Sexual Activity   Alcohol use: Yes   Drug use: No   Sexual activity: Not on file  Other Topics Concern   Not on file  Social History Narrative   Not on file   Social Drivers of Health   Financial Resource Strain: Low Risk  (07/01/2023)   Overall Financial Resource Strain (CARDIA)     Difficulty of Paying Living Expenses: Not hard at all  Food Insecurity: Low Risk  (10/16/2023)   Received from [facility]   Hunger Vital Sign    Worried About Running Out of Food in the Last Year: Never true    Ran Out of Food in the Last Year: Never true  Transportation Needs: No Transportation Needs (10/16/2023)   Received from [facility]    In the past 12 months, has lack of reliable transportation kept you from medical appointments, meetings, work or from getting things needed for daily living? : No  Physical Activity: Sufficiently Active (07/01/2023)   Exercise Vital Sign    Days of Exercise per Week: 7 days    Minutes of Exercise per Session: 40 min  Stress: No Stress Concern Present (07/01/2023)   [employer] of Occupational Health - Occupational Stress Questionnaire    Feeling of Stress : Not at all  Social Connections: Socially Integrated (07/01/2023)   Social Connection and Isolation Panel [NHANES]    Frequency of Communication with Friends and Family: Not on file    Frequency of Social Gatherings with Friends and Family: Three times a week    Attends Religious Services: More than 4 times per year    Active Member of Clubs or Organizations: Yes    Attends Banker Meetings: More than 4 times per year    Marital Status: Married  Catering manager Violence: Not At Risk (07/01/2023)   Humiliation, Afraid, Rape, and Kick questionnaire    Fear of Current or Ex-Partner: No    Emotionally Abused: No    Physically Abused: No    Sexually Abused: No    FAMILY HISTORY: No family history on file.  ALLERGIES:  is allergic to amoxicillin.  MEDICATIONS:  Current Outpatient Medications  Medication Sig Dispense Refill   acyclovir (ZOVIRAX) 400 MG tablet Take 1 tablet (400 mg total) by mouth 2 (two) times daily. 60 tablet 5   aspirin EC 81 MG tablet Take 1 tablet (81 mg total) by mouth daily. 100 tablet 3   azithromycin (ZITHROMAX) 250 MG tablet  Take 1 tablet (250 mg total) by mouth daily. Take first 2 tablets together, then 1 every day until finished. (Patient not taking: Reported on 11/07/2023) 6 tablet 0   benzonatate (TESSALON) 100 MG capsule Take 1 capsule (100 mg total) by mouth every 8 (eight) hours. (Patient not  taking: Reported on 11/07/2023) 21 capsule 0   Cobalamin Combinations (B-12) [phone] MCG SUBL Take 1 tablet by mouth daily.     Cyanocobalamin 5000 MCG/ML LIQD 1 tablet 1 day or 1 dose.     dexamethasone (DECADRON) 4 MG tablet Take 5 tabs (20 mg) weekly the day after daratumumab for 12 weeks. Take with breakfast. 20 tablet 5   lenalidomide (REVLIMID) 15 MG capsule Take 1 capsule (15 mg total) by mouth daily. Take for 21 days on, 7 days off, repeat every 21 days x 6. 21 capsule 0   ondansetron (ZOFRAN) 8 MG tablet Take 1 tablet (8 mg total) by mouth every 8 (eight) hours as needed for nausea or vomiting. 30 tablet 1   prochlorperazine (COMPAZINE) 10 MG tablet Take 1 tablet (10 mg total) by mouth every 6 (six) hours as needed for nausea or vomiting. 30 tablet 1   No current facility-administered medications for this visit.    REVIEW OF SYSTEMS:    10 Point review of Systems was done is negative except as noted above.   PHYSICAL EXAMINATION: ECOG PERFORMANCE STATUS: 1 - Symptomatic but completely ambulatory  . Vitals:   11/29/23 1253  BP: 119/68  Pulse: (!) 53  Resp: 16  Temp: 97.9 F (36.6 C)  SpO2: 100%    Filed Weights   11/29/23 1253  Weight: 174 lb 12.8 oz (79.3 kg)    .Body mass index is 24.38 kg/m.   GENERAL:alert, in no acute distress and comfortable SKIN: no acute rashes, no significant lesions EYES: conjunctiva are pink and non-injected, sclera anicteric OROPHARYNX: MMM, no exudates, no oropharyngeal erythema or ulceration NECK: supple, no JVD LYMPH:  no palpable lymphadenopathy in the cervical, axillary or inguinal regions LUNGS: clear to auscultation b/l with normal respiratory  effort HEART: regular rate & rhythm ABDOMEN:  normoactive bowel sounds , non tender, not distended. Extremity: no pedal edema PSYCH: alert & oriented x 3 with fluent speech NEURO: no focal motor/sensory deficits   LABORATORY DATA:  I have reviewed the data as listed .    Latest Ref Rng & Units 11/29/2023   12:43 PM 11/21/2023    1:18 PM 11/15/2023   11:32 AM  CBC  WBC 4.0 - 10.5 K/uL 1.6  1.0  1.8   Hemoglobin 13.0 - 17.0 g/dL 40.9  81.1  91.4   Hematocrit 39.0 - 52.0 % 31.3  30.4  32.9   Platelets 150 - 400 K/uL 168  223  227     .    Latest Ref Rng & Units 11/29/2023   12:48 PM 11/15/2023   11:32 AM 11/07/2023   10:32 AM  CMP  Glucose 70 - 99 mg/dL 87  782  956   BUN 8 - 23 mg/dL 23  17  20    Creatinine 0.61 - 1.24 mg/dL 2.13  0.86  5.78   Sodium 135 - 145 mmol/L 141  141  142   Potassium 3.5 - 5.1 mmol/L 4.7  4.2  4.7   Chloride 98 - 111 mmol/L 109  107  108   CO2 22 - 32 mmol/L 28  31  31    Calcium 8.9 - 10.3 mg/dL 8.3  8.3  9.4   Total Protein 6.5 - 8.1 g/dL 5.6  5.8  6.2   Total Bilirubin 0.0 - 1.2 mg/dL 0.4  0.5  0.4   Alkaline Phos 38 - 126 U/L 41  39  40   AST 15 - 41 U/L 11  9  12   ALT 0 - 44 U/L 13  10  8       07/17/2023 Surgical pathology:   07/17/2023 Surgical pathology:      RADIOGRAPHIC STUDIES: I have personally reviewed the radiological images as listed and agreed with the findings in the report. No results found.  ASSESSMENT & PLAN:   73 y.o. 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 Recently diagnosed Active Multiple myeloma -Kappa light chain producing.  BM Bx showing a second population of lymphoplasmacytoid cells which are 90% of the lymphoplasmacytoid cells which are CD20+ve and neg for CD5 and CD10 and show Kappa light  chain restriction. 3. 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  4. 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 CBC showed WBC of 1.6K, hemoglobin of 10.6, and platelets of 168K. -hgb stable -WBCs remain fairly low, though it is improving from 1.0K to 1.6K over the last week. -neutrophils remain low at 300. His WBCs should continue to improve over the next week.  -discussed option to either continue monitoring WBCs or to consider a growth factor injection to boost WBCs  -discussed potential cons of growth factor injection such as causing aches and pains from stimulating the bone marrow -patient would prefer to hold off on growth factor injection at this time -continue daratumumab treatment -educated patient that daratumumab is fairly effective and generally would not affect blood counts -patient has tolerated treatment well without any major toxicities and he shall continue -will continue to hold Revlmid at this time due to neutropenia -discussed that there may be a role to start low-dose Revlimid potentially next week based on labs - will hold Revlimid till ANC recovers to 1000 and drop Revlimid dose for C2 to 15mg  -will refill steroids -educated patient that steroids could cause familial tremors to be more prominent -recommend patient to continue to optimize nutrition and continue to stay active as much as possible especially to keep the muscles conditioned -continue vitamin D and B complex supplements -discussed goal to keep vitamin D levels in high-normal range of 60-90  -advised patient to try to keep the other aspects of his life as simple as possible to avoid feeling overwhelmed and improving mental state -answered all of patient's and his wife's questions in detail -discussed that it is highly unlikely that he would have toxicities from B vitamin supplementation. Also  discussed that it would be very unusual for someone to be overtly overloaded with vitamin D levels.   FOLLOW-UP: Per integrated scheduling  The total time spent in the appointment was 30 minutes* .  All of the patient's questions were answered with apparent satisfaction. The patient knows to call the clinic with any problems, questions or concerns.   Wyvonnia Lora MD MS AAHIVMS [facility] Hematology/Oncology Physician [facility]  .*Total Encounter Time as defined by the Centers for Medicare and Medicaid Services includes, in addition to the face-to-face time of a patient visit (documented in the note above) non-face-to-face time: obtaining and reviewing outside history, ordering and reviewing medications, tests or procedures, care coordination (communications with other health care professionals or caregivers) and documentation in the medical record.    I,Mitra Faeizi,acting as a  scribe for Wyvonnia Lora, MD.,have documented all relevant documentation on the behalf of Wyvonnia Lora, MD,as directed by  Wyvonnia Lora, MD while in the presence of Wyvonnia Lora, MD.  .I have reviewed the above documentation for accuracy and completeness, and I agree with the above. Johney Maine MD

## 2023-11-29 NOTE — Progress Notes (Signed)
 Patient seen by Dr. Addison Naegeli are within treatment parameters.  Labs reviewed: and are not all within treatment parameters.    Dr Candise Che aware ANC: 0.3, CMP processing may proceed with tx prior to resulting  Per physician team, patient is ready for treatment and there are NO modifications to the treatment plan.

## 2023-11-29 NOTE — Patient Instructions (Signed)
 [facility] - A DEPT OF [facility]  Discharge Instructions: Thank you for choosing [facility] to provide your oncology and hematology care.   If you have a lab appointment with the Cancer Center, please go directly to the Cancer Center and check in at the registration area.   Wear comfortable clothing and clothing appropriate for easy access to any Portacath or PICC line.   We strive to give you quality time with your provider. You may need to reschedule your appointment if you arrive late (15 or more minutes).  Arriving late affects you and other patients whose appointments are after yours.  Also, if you miss three or more appointments without notifying the office, you may be dismissed from the clinic at the provider's discretion.      For prescription refill requests, have your pharmacy contact our office and allow 72 hours for refills to be completed.    Today you received the following chemotherapy and/or immunotherapy agents: Darzalex Faspro      To help prevent nausea and vomiting after your treatment, we encourage you to take your nausea medication as directed.  BELOW ARE SYMPTOMS THAT SHOULD BE REPORTED IMMEDIATELY: *FEVER GREATER THAN 100.4 F (38 C) OR HIGHER *CHILLS OR SWEATING *NAUSEA AND VOMITING THAT IS NOT CONTROLLED WITH YOUR NAUSEA MEDICATION *UNUSUAL SHORTNESS OF BREATH *UNUSUAL BRUISING OR BLEEDING *URINARY PROBLEMS (pain or burning when urinating, or frequent urination) *BOWEL PROBLEMS (unusual diarrhea, constipation, pain near the anus) TENDERNESS IN MOUTH AND THROAT WITH OR WITHOUT PRESENCE OF ULCERS (sore throat, sores in mouth, or a toothache) UNUSUAL RASH, SWELLING OR PAIN  UNUSUAL VAGINAL DISCHARGE OR ITCHING   Items with * indicate a potential emergency and should be followed up as soon as possible or go to the Emergency Department if any problems should occur.  Please show the CHEMOTHERAPY ALERT CARD or  IMMUNOTHERAPY ALERT CARD at check-in to the Emergency Department and triage nurse.  Should you have questions after your visit or need to cancel or reschedule your appointment, please contact [facility] - A DE[facility]  Dept: [phone]  and follow the prompts.  Office hours are 8:00 a.m. to 4:30 p.m. Monday - Friday. Please note that voicemails left after 4:00 p.m. may not be returned until the following business day.  We are closed weekends and major holidays. You have access to a nurse at all times for urgent questions. Please call the main number to the clinic Dept: [phone] and follow the prompts.   For any non-urgent questions, you may also contact your provider using MyChart. We now offer e-Visits for anyone 82 and older to request care online for non-urgent symptoms. For details visit [email or web address].   Also download the MyChart app! Go to the app store, search "MyChart", open the app, select [place], and log in with your MyChart username and password.  Neutropenia Neutropenia is a condition that occurs when you have low levels of neutrophils. Neutrophils are a type of white blood cells. They are made in the spongy center of bones (bone marrow). They fight infections. Neutrophils are your body's main defense against infections. The fewer neutrophils you have and the longer your body remains without them, the greater your risk of getting a severe infection. What are the causes? This condition can occur if your body uses up or destroys neutrophils faster than your bone marrow can make them. Neutropenia  may be caused by: A bacterial or fungal infection. Allergic disorders. Reactions to some medicines. An autoimmune disease. An enlarged spleen. This condition can also occur if your bone marrow does not produce enough neutrophils. This problem may be caused by: Cancer. Cancer treatments, such as radiation or chemotherapy. Viral  infections. Medicines, such as phenytoin. Vitamin B12 deficiency. Diseases of the bone marrow. Environmental toxins, such as insecticides. What are the signs or symptoms? This condition does not usually cause symptoms. If symptoms are present, they are usually caused by an underlying infection. Symptoms of an infection may include: Fever. Chills. Swollen glands. Mouth ulcers. Cough. Rash or skin infection. Skin may be red, swollen, or painful. Abdominal or rectal pain. Frequent urination or pain or burning with urination. Because neutropenia weakens the immune system, symptoms of infection may be reduced. It is important to be aware of any changes in your body and talk to your health care provider. How is this diagnosed? This condition is diagnosed based on your medical history and a physical exam. Tests will also be done, such as: A complete blood count (CBC). Bone marrow biopsy. This is collecting a sample of bone marrow for testing. A chest X-ray. A urine culture. A blood culture. How is this treated? Treatment depends on the underlying cause and severity of your condition. Mild neutropenia may not require treatment. Treatment may include medicines, such as: Antibiotic medicine given through an IV. Antiviral medicines. Antifungal medicines. A medicine to increase production of neutrophils (colony-stimulating factor). You may get this medicine through an IV or by injection. Steroids given through an IV. If an underlying condition is causing neutropenia, you may need treatment for that condition. If medicines or cancer treatments are causing neutropenia, your health care provider may have you stop the medicines or treatment. Follow these instructions at home: Medicines  Take over-the-counter and prescription medicines only as told by your health care provider. Get an annual flu shot. Ask your health care provider whether you or anyone you live with needs any other  vaccines. Eating and drinking Do not share food utensils. Do not eat unpasteurized foods. Do not eat raw or undercooked meat, eggs, or seafood. Do not eat unwashed, raw fruits or vegetables. Lifestyle Avoid exposure to groups of people or children. Avoid being around people who are sick. Avoid being around live plants or fresh flowers. Avoid being around dirt or dust, such as in construction areas or gardens. Wear gloves if you are going to do yard work or gardening. Do not provide direct care for pets. Avoid animal droppings. Do not clean litter boxes and bird cages. Do not have sex unless your health care provider has approved. Hygiene  Bathe daily. Clean the area between the genitals and the anus (perineal area) after you urinate or have a bowel movement. If you are male, wipe from front to back. Get regular dental care and brush your teeth with a soft toothbrush before and after meals. Do not use a regular razor. Use an electric razor to remove hair. Wash your hands often with soap and water for at least 20 seconds. Make sure others who come in contact with you also wash their hands. If soap and water are not available, use hand sanitizer. General instructions Take steps to reduce your risk of injury or infection. Follow any precautions as told by your health care provider. Take actions to avoid cuts and burns. For example: Be cautious when you use knives. Always cut away from yourself. Keep knives  in protective sheaths or guards when not in use. Use oven mitts when you cook with a hot stove, oven, or grill. Stand a safe distance away from open fires. Do not use tampons, enemas, or rectal suppositories unless your health care provider has approved. Keep all follow-up visits. This is important. Contact a health care provider if: You have a cough. You have a sore throat. You develop sores in your mouth or anus. You have a warm, red, or tender area on your skin. You have red  streaks on the skin. You develop a rash. You have swollen lymph nodes. You have frequent or painful urination. You have vaginal discharge or itching. Get help right away if: You have a fever. You have chills or shaking. You have nausea or vomiting. You have a lot of fatigue. You have shortness of breath. Summary Neutropenia is a condition that occurs when you have a lower-than-normal level of a type of white blood cell (neutrophils) in your body. This condition can occur if your body uses up or destroys neutrophils faster than your bone marrow can make them. Treatment depends on the underlying cause and severity of your condition. Mild neutropenia may not require treatment. Follow any precautions as told by your health care provider to reduce your risk for injury or infection. This information is not intended to replace advice given to you by your health care provider. Make sure you discuss any questions you have with your health care provider. Document Revised: 02/22/2021 Document Reviewed: 02/22/2021 Elsevier Patient Education  2024 [employer].

## 2023-12-04 ENCOUNTER — Inpatient Hospital Stay

## 2023-12-04 ENCOUNTER — Inpatient Hospital Stay: Admitting: Hematology

## 2023-12-06 ENCOUNTER — Inpatient Hospital Stay

## 2023-12-06 ENCOUNTER — Inpatient Hospital Stay: Admitting: General Practice

## 2023-12-06 ENCOUNTER — Encounter: Payer: Self-pay | Admitting: Hematology

## 2023-12-06 VITALS — BP 135/72 | HR 55 | Temp 98.0°F | Resp 16 | Wt 175.0 lb

## 2023-12-06 DIAGNOSIS — C9000 Multiple myeloma not having achieved remission: Secondary | ICD-10-CM | POA: Diagnosis not present

## 2023-12-06 LAB — CBC WITH DIFFERENTIAL (CANCER CENTER ONLY)
Abs Immature Granulocytes: 0.01 10*3/uL (ref 0.00–0.07)
Basophils Absolute: 0 10*3/uL (ref 0.0–0.1)
Basophils Relative: 1 %
Eosinophils Absolute: 0 10*3/uL (ref 0.0–0.5)
Eosinophils Relative: 1 %
HCT: 32.9 % — ABNORMAL LOW (ref 39.0–52.0)
Hemoglobin: 11 g/dL — ABNORMAL LOW (ref 13.0–17.0)
Immature Granulocytes: 0 %
Lymphocytes Relative: 33 %
Lymphs Abs: 1.1 10*3/uL (ref 0.7–4.0)
MCH: 33.1 pg (ref 26.0–34.0)
MCHC: 33.4 g/dL (ref 30.0–36.0)
MCV: 99.1 fL (ref 80.0–100.0)
Monocytes Absolute: 0.2 10*3/uL (ref 0.1–1.0)
Monocytes Relative: 5 %
Neutro Abs: 2 10*3/uL (ref 1.7–7.7)
Neutrophils Relative %: 60 %
Platelet Count: 271 10*3/uL (ref 150–400)
RBC: 3.32 MIL/uL — ABNORMAL LOW (ref 4.22–5.81)
RDW: 14.1 % (ref 11.5–15.5)
WBC Count: 3.4 10*3/uL — ABNORMAL LOW (ref 4.0–10.5)
nRBC: 0 % (ref 0.0–0.2)

## 2023-12-06 LAB — CMP (CANCER CENTER ONLY)
ALT: 17 U/L (ref 0–44)
AST: 14 U/L — ABNORMAL LOW (ref 15–41)
Albumin: 4.1 g/dL (ref 3.5–5.0)
Alkaline Phosphatase: 47 U/L (ref 38–126)
Anion gap: 3 — ABNORMAL LOW (ref 5–15)
BUN: 24 mg/dL — ABNORMAL HIGH (ref 8–23)
CO2: 29 mmol/L (ref 22–32)
Calcium: 9.2 mg/dL (ref 8.9–10.3)
Chloride: 108 mmol/L (ref 98–111)
Creatinine: 0.99 mg/dL (ref 0.61–1.24)
GFR, Estimated: >60 mL/min (ref >60)
Glucose, Bld: 93 mg/dL (ref 70–99)
Potassium: 4.5 mmol/L (ref 3.5–5.1)
Sodium: 140 mmol/L (ref 135–145)
Total Bilirubin: 0.5 mg/dL (ref 0.0–1.2)
Total Protein: 6 g/dL — ABNORMAL LOW (ref 6.5–8.1)

## 2023-12-06 MED ORDER — DIPHENHYDRAMINE HCL 25 MG PO CAPS
50.0000 mg | ORAL_CAPSULE | Freq: Once | ORAL | Status: AC
Start: 2023-12-06 — End: 2023-12-06
  Administered 2023-12-06: 50 mg via ORAL
  Filled 2023-12-06: qty 2

## 2023-12-06 MED ORDER — ACETAMINOPHEN 325 MG PO TABS
650.0000 mg | ORAL_TABLET | Freq: Once | ORAL | Status: AC
  Administered 2023-12-06: 650 mg via ORAL
  Filled 2023-12-06: qty 2

## 2023-12-06 MED ORDER — DARATUMUMAB-HYALURONIDASE-FIHJ 1800-30000 MG-UT/15ML ~~LOC~~ SOLN
1800.0000 mg | Freq: Once | SUBCUTANEOUS | Status: AC
  Administered 2023-12-06: 1800 mg via SUBCUTANEOUS
  Filled 2023-12-06: qty 15

## 2023-12-06 MED ORDER — DEXAMETHASONE 4 MG PO TABS
20.0000 mg | ORAL_TABLET | Freq: Once | ORAL | Status: AC
  Administered 2023-12-06: 20 mg via ORAL
  Filled 2023-12-06: qty 5

## 2023-12-06 NOTE — Patient Instructions (Signed)
 [facility] - A DEPT OF [facility]  Discharge Instructions: Thank you for choosing [facility] to provide your oncology and hematology care.   If you have a lab appointment with the Cancer Center, please go directly to the Cancer Center and check in at the registration area.   Wear comfortable clothing and clothing appropriate for easy access to any Portacath or PICC line.   We strive to give you quality time with your provider. You may need to reschedule your appointment if you arrive late (15 or more minutes).  Arriving late affects you and other patients whose appointments are after yours.  Also, if you miss three or more appointments without notifying the office, you may be dismissed from the clinic at the provider's discretion.      For prescription refill requests, have your pharmacy contact our office and allow 72 hours for refills to be completed.    Today you received the following chemotherapy and/or immunotherapy agents: Darzalex Faspro      To help prevent nausea and vomiting after your treatment, we encourage you to take your nausea medication as directed.  BELOW ARE SYMPTOMS THAT SHOULD BE REPORTED IMMEDIATELY: *FEVER GREATER THAN 100.4 F (38 C) OR HIGHER *CHILLS OR SWEATING *NAUSEA AND VOMITING THAT IS NOT CONTROLLED WITH YOUR NAUSEA MEDICATION *UNUSUAL SHORTNESS OF BREATH *UNUSUAL BRUISING OR BLEEDING *URINARY PROBLEMS (pain or burning when urinating, or frequent urination) *BOWEL PROBLEMS (unusual diarrhea, constipation, pain near the anus) TENDERNESS IN MOUTH AND THROAT WITH OR WITHOUT PRESENCE OF ULCERS (sore throat, sores in mouth, or a toothache) UNUSUAL RASH, SWELLING OR PAIN  UNUSUAL VAGINAL DISCHARGE OR ITCHING   Items with * indicate a potential emergency and should be followed up as soon as possible or go to the Emergency Department if any problems should occur.  Please show the CHEMOTHERAPY ALERT CARD or  IMMUNOTHERAPY ALERT CARD at check-in to the Emergency Department and triage nurse.  Should you have questions after your visit or need to cancel or reschedule your appointment, please contact [facility] - A DE[facility]  Dept: [phone]  and follow the prompts.  Office hours are 8:00 a.m. to 4:30 p.m. Monday - Friday. Please note that voicemails left after 4:00 p.m. may not be returned until the following business day.  We are closed weekends and major holidays. You have access to a nurse at all times for urgent questions. Please call the main number to the clinic Dept: [phone] and follow the prompts.   For any non-urgent questions, you may also contact your provider using MyChart. We now offer e-Visits for anyone 82 and older to request care online for non-urgent symptoms. For details visit [email or web address].   Also download the MyChart app! Go to the app store, search "MyChart", open the app, select [place], and log in with your MyChart username and password.  Neutropenia Neutropenia is a condition that occurs when you have low levels of neutrophils. Neutrophils are a type of white blood cells. They are made in the spongy center of bones (bone marrow). They fight infections. Neutrophils are your body's main defense against infections. The fewer neutrophils you have and the longer your body remains without them, the greater your risk of getting a severe infection. What are the causes? This condition can occur if your body uses up or destroys neutrophils faster than your bone marrow can make them. Neutropenia  may be caused by: A bacterial or fungal infection. Allergic disorders. Reactions to some medicines. An autoimmune disease. An enlarged spleen. This condition can also occur if your bone marrow does not produce enough neutrophils. This problem may be caused by: Cancer. Cancer treatments, such as radiation or chemotherapy. Viral  infections. Medicines, such as phenytoin. Vitamin B12 deficiency. Diseases of the bone marrow. Environmental toxins, such as insecticides. What are the signs or symptoms? This condition does not usually cause symptoms. If symptoms are present, they are usually caused by an underlying infection. Symptoms of an infection may include: Fever. Chills. Swollen glands. Mouth ulcers. Cough. Rash or skin infection. Skin may be red, swollen, or painful. Abdominal or rectal pain. Frequent urination or pain or burning with urination. Because neutropenia weakens the immune system, symptoms of infection may be reduced. It is important to be aware of any changes in your body and talk to your health care provider. How is this diagnosed? This condition is diagnosed based on your medical history and a physical exam. Tests will also be done, such as: A complete blood count (CBC). Bone marrow biopsy. This is collecting a sample of bone marrow for testing. A chest X-ray. A urine culture. A blood culture. How is this treated? Treatment depends on the underlying cause and severity of your condition. Mild neutropenia may not require treatment. Treatment may include medicines, such as: Antibiotic medicine given through an IV. Antiviral medicines. Antifungal medicines. A medicine to increase production of neutrophils (colony-stimulating factor). You may get this medicine through an IV or by injection. Steroids given through an IV. If an underlying condition is causing neutropenia, you may need treatment for that condition. If medicines or cancer treatments are causing neutropenia, your health care provider may have you stop the medicines or treatment. Follow these instructions at home: Medicines  Take over-the-counter and prescription medicines only as told by your health care provider. Get an annual flu shot. Ask your health care provider whether you or anyone you live with needs any other  vaccines. Eating and drinking Do not share food utensils. Do not eat unpasteurized foods. Do not eat raw or undercooked meat, eggs, or seafood. Do not eat unwashed, raw fruits or vegetables. Lifestyle Avoid exposure to groups of people or children. Avoid being around people who are sick. Avoid being around live plants or fresh flowers. Avoid being around dirt or dust, such as in construction areas or gardens. Wear gloves if you are going to do yard work or gardening. Do not provide direct care for pets. Avoid animal droppings. Do not clean litter boxes and bird cages. Do not have sex unless your health care provider has approved. Hygiene  Bathe daily. Clean the area between the genitals and the anus (perineal area) after you urinate or have a bowel movement. If you are male, wipe from front to back. Get regular dental care and brush your teeth with a soft toothbrush before and after meals. Do not use a regular razor. Use an electric razor to remove hair. Wash your hands often with soap and water for at least 20 seconds. Make sure others who come in contact with you also wash their hands. If soap and water are not available, use hand sanitizer. General instructions Take steps to reduce your risk of injury or infection. Follow any precautions as told by your health care provider. Take actions to avoid cuts and burns. For example: Be cautious when you use knives. Always cut away from yourself. Keep knives  in protective sheaths or guards when not in use. Use oven mitts when you cook with a hot stove, oven, or grill. Stand a safe distance away from open fires. Do not use tampons, enemas, or rectal suppositories unless your health care provider has approved. Keep all follow-up visits. This is important. Contact a health care provider if: You have a cough. You have a sore throat. You develop sores in your mouth or anus. You have a warm, red, or tender area on your skin. You have red  streaks on the skin. You develop a rash. You have swollen lymph nodes. You have frequent or painful urination. You have vaginal discharge or itching. Get help right away if: You have a fever. You have chills or shaking. You have nausea or vomiting. You have a lot of fatigue. You have shortness of breath. Summary Neutropenia is a condition that occurs when you have a lower-than-normal level of a type of white blood cell (neutrophils) in your body. This condition can occur if your body uses up or destroys neutrophils faster than your bone marrow can make them. Treatment depends on the underlying cause and severity of your condition. Mild neutropenia may not require treatment. Follow any precautions as told by your health care provider to reduce your risk for injury or infection. This information is not intended to replace advice given to you by your health care provider. Make sure you discuss any questions you have with your health care provider. Document Revised: 02/22/2021 Document Reviewed: 02/22/2021 Elsevier Patient Education  2024 [employer].

## 2023-12-06 NOTE — Progress Notes (Signed)
 [facility] Advance Directives Spiritual Care  Patient presented to Advance Directives Clinic  to review and complete healthcare advance directives.  Clinical Chaplain met with patient and his wife Colin Calderon.  The patient designated Colin Calderon [phone]) as their primary healthcare agent and Colin Calderon [phone]) as their secondary agent.  Patient also completed healthcare living will.    Documents were notarized and copies made for patient/family. Chaplain will send documents to medical records to be scanned into patient's chart. Chaplain encouraged patient/family to contact with any additional questions or concerns.   [address] [place], [place], [facility] Pager [phone] Voicemail [phone]

## 2023-12-09 DIAGNOSIS — G4733 Obstructive sleep apnea (adult) (pediatric): Secondary | ICD-10-CM | POA: Diagnosis not present

## 2023-12-09 LAB — KAPPA/LAMBDA LIGHT CHAINS
Kappa free light chain: 237.5 mg/L — ABNORMAL HIGH (ref 3.3–19.4)
Kappa, lambda light chain ratio: 148.44 — ABNORMAL HIGH (ref 0.26–1.65)
Lambda free light chains: 1.6 mg/L — ABNORMAL LOW (ref 5.7–26.3)

## 2023-12-09 LAB — MULTIPLE MYELOMA PANEL, SERUM
Albumin SerPl Elph-Mcnc: 3.6 g/dL (ref 2.9–4.4)
Albumin/Glob SerPl: 2.2 — ABNORMAL HIGH (ref 0.7–1.7)
Alpha 1: 0.2 g/dL (ref 0.0–0.4)
Alpha2 Glob SerPl Elph-Mcnc: 0.5 g/dL (ref 0.4–1.0)
B-Globulin SerPl Elph-Mcnc: 0.7 g/dL (ref 0.7–1.3)
Gamma Glob SerPl Elph-Mcnc: 0.3 g/dL — ABNORMAL LOW (ref 0.4–1.8)
Globulin, Total: 1.7 g/dL — ABNORMAL LOW (ref 2.2–3.9)
IgA: 5 mg/dL — ABNORMAL LOW (ref 61–437)
IgG (Immunoglobin G), Serum: 449 mg/dL — ABNORMAL LOW (ref 603–1613)
IgM (Immunoglobulin M), Srm: <5 mg/dL — ABNORMAL LOW (ref 15–143)
M Protein SerPl Elph-Mcnc: 0.1 g/dL — ABNORMAL HIGH
Total Protein ELP: 5.3 g/dL — ABNORMAL LOW (ref 6.0–8.5)

## 2023-12-10 ENCOUNTER — Other Ambulatory Visit: Payer: Self-pay

## 2023-12-10 DIAGNOSIS — G4733 Obstructive sleep apnea (adult) (pediatric): Secondary | ICD-10-CM | POA: Diagnosis not present

## 2023-12-12 ENCOUNTER — Inpatient Hospital Stay: Attending: Oncology

## 2023-12-12 ENCOUNTER — Inpatient Hospital Stay

## 2023-12-12 VITALS — BP 125/71 | HR 62 | Temp 98.0°F | Resp 17 | Wt 172.8 lb

## 2023-12-12 DIAGNOSIS — Z79899 Other long term (current) drug therapy: Secondary | ICD-10-CM | POA: Diagnosis not present

## 2023-12-12 DIAGNOSIS — C9000 Multiple myeloma not having achieved remission: Secondary | ICD-10-CM | POA: Diagnosis not present

## 2023-12-12 DIAGNOSIS — Z5112 Encounter for antineoplastic immunotherapy: Secondary | ICD-10-CM | POA: Diagnosis not present

## 2023-12-12 DIAGNOSIS — D539 Nutritional anemia, unspecified: Secondary | ICD-10-CM | POA: Insufficient documentation

## 2023-12-12 LAB — CBC WITH DIFFERENTIAL (CANCER CENTER ONLY)
Abs Immature Granulocytes: 0 10*3/uL (ref 0.00–0.07)
Basophils Absolute: 0 10*3/uL (ref 0.0–0.1)
Basophils Relative: 1 %
Eosinophils Absolute: 0 10*3/uL (ref 0.0–0.5)
Eosinophils Relative: 1 %
HCT: 32.2 % — ABNORMAL LOW (ref 39.0–52.0)
Hemoglobin: 10.9 g/dL — ABNORMAL LOW (ref 13.0–17.0)
Immature Granulocytes: 0 %
Lymphocytes Relative: 27 %
Lymphs Abs: 1.1 10*3/uL (ref 0.7–4.0)
MCH: 33.6 pg (ref 26.0–34.0)
MCHC: 33.9 g/dL (ref 30.0–36.0)
MCV: 99.4 fL (ref 80.0–100.0)
Monocytes Absolute: 0.1 10*3/uL (ref 0.1–1.0)
Monocytes Relative: 3 %
Neutro Abs: 2.8 10*3/uL (ref 1.7–7.7)
Neutrophils Relative %: 68 %
Platelet Count: 296 10*3/uL (ref 150–400)
RBC: 3.24 MIL/uL — ABNORMAL LOW (ref 4.22–5.81)
RDW: 14.2 % (ref 11.5–15.5)
WBC Count: 4.1 10*3/uL (ref 4.0–10.5)
nRBC: 0 % (ref 0.0–0.2)

## 2023-12-12 LAB — CMP (CANCER CENTER ONLY)
ALT: 14 U/L (ref 0–44)
AST: 11 U/L — ABNORMAL LOW (ref 15–41)
Albumin: 4 g/dL (ref 3.5–5.0)
Alkaline Phosphatase: 47 U/L (ref 38–126)
Anion gap: 4 — ABNORMAL LOW (ref 5–15)
BUN: 19 mg/dL (ref 8–23)
CO2: 32 mmol/L (ref 22–32)
Calcium: 8.7 mg/dL — ABNORMAL LOW (ref 8.9–10.3)
Chloride: 107 mmol/L (ref 98–111)
Creatinine: 0.97 mg/dL (ref 0.61–1.24)
GFR, Estimated: >60 mL/min (ref >60)
Glucose, Bld: 109 mg/dL — ABNORMAL HIGH (ref 70–99)
Potassium: 4.2 mmol/L (ref 3.5–5.1)
Sodium: 143 mmol/L (ref 135–145)
Total Bilirubin: 0.4 mg/dL (ref 0.0–1.2)
Total Protein: 5.7 g/dL — ABNORMAL LOW (ref 6.5–8.1)

## 2023-12-12 MED ORDER — ACETAMINOPHEN 325 MG PO TABS
650.0000 mg | ORAL_TABLET | Freq: Once | ORAL | Status: AC
  Administered 2023-12-12: 650 mg via ORAL
  Filled 2023-12-12: qty 2

## 2023-12-12 MED ORDER — DIPHENHYDRAMINE HCL 25 MG PO CAPS
50.0000 mg | ORAL_CAPSULE | Freq: Once | ORAL | Status: AC
  Administered 2023-12-12: 50 mg via ORAL
  Filled 2023-12-12: qty 2

## 2023-12-12 MED ORDER — DARATUMUMAB-HYALURONIDASE-FIHJ 1800-30000 MG-UT/15ML ~~LOC~~ SOLN
1800.0000 mg | Freq: Once | SUBCUTANEOUS | Status: AC
  Administered 2023-12-12: 1800 mg via SUBCUTANEOUS
  Filled 2023-12-12: qty 15

## 2023-12-12 MED ORDER — DEXAMETHASONE 4 MG PO TABS
20.0000 mg | ORAL_TABLET | Freq: Once | ORAL | Status: AC
  Administered 2023-12-12: 20 mg via ORAL
  Filled 2023-12-12: qty 5

## 2023-12-12 NOTE — Patient Instructions (Signed)
 [facility] - A DEPT OF [facility]  Discharge Instructions: Thank you for choosing [facility] to provide your oncology and hematology care.   If you have a lab appointment with the Cancer Center, please go directly to the Cancer Center and check in at the registration area.   Wear comfortable clothing and clothing appropriate for easy access to any Portacath or PICC line.   We strive to give you quality time with your provider. You may need to reschedule your appointment if you arrive late (15 or more minutes).  Arriving late affects you and other patients whose appointments are after yours.  Also, if you miss three or more appointments without notifying the office, you may be dismissed from the clinic at the provider's discretion.      For prescription refill requests, have your pharmacy contact our office and allow 72 hours for refills to be completed.    Today you received the following chemotherapy and/or immunotherapy agents: Darzalex Faspro      To help prevent nausea and vomiting after your treatment, we encourage you to take your nausea medication as directed.  BELOW ARE SYMPTOMS THAT SHOULD BE REPORTED IMMEDIATELY: *FEVER GREATER THAN 100.4 F (38 C) OR HIGHER *CHILLS OR SWEATING *NAUSEA AND VOMITING THAT IS NOT CONTROLLED WITH YOUR NAUSEA MEDICATION *UNUSUAL SHORTNESS OF BREATH *UNUSUAL BRUISING OR BLEEDING *URINARY PROBLEMS (pain or burning when urinating, or frequent urination) *BOWEL PROBLEMS (unusual diarrhea, constipation, pain near the anus) TENDERNESS IN MOUTH AND THROAT WITH OR WITHOUT PRESENCE OF ULCERS (sore throat, sores in mouth, or a toothache) UNUSUAL RASH, SWELLING OR PAIN  UNUSUAL VAGINAL DISCHARGE OR ITCHING   Items with * indicate a potential emergency and should be followed up as soon as possible or go to the Emergency Department if any problems should occur.  Please show the CHEMOTHERAPY ALERT CARD or  IMMUNOTHERAPY ALERT CARD at check-in to the Emergency Department and triage nurse.  Should you have questions after your visit or need to cancel or reschedule your appointment, please contact [facility] - A DE[facility]  Dept: [phone]  and follow the prompts.  Office hours are 8:00 a.m. to 4:30 p.m. Monday - Friday. Please note that voicemails left after 4:00 p.m. may not be returned until the following business day.  We are closed weekends and major holidays. You have access to a nurse at all times for urgent questions. Please call the main number to the clinic Dept: [phone] and follow the prompts.   For any non-urgent questions, you may also contact your provider using MyChart. We now offer e-Visits for anyone 82 and older to request care online for non-urgent symptoms. For details visit [email or web address].   Also download the MyChart app! Go to the app store, search "MyChart", open the app, select [place], and log in with your MyChart username and password.  Neutropenia Neutropenia is a condition that occurs when you have low levels of neutrophils. Neutrophils are a type of white blood cells. They are made in the spongy center of bones (bone marrow). They fight infections. Neutrophils are your body's main defense against infections. The fewer neutrophils you have and the longer your body remains without them, the greater your risk of getting a severe infection. What are the causes? This condition can occur if your body uses up or destroys neutrophils faster than your bone marrow can make them. Neutropenia  may be caused by: A bacterial or fungal infection. Allergic disorders. Reactions to some medicines. An autoimmune disease. An enlarged spleen. This condition can also occur if your bone marrow does not produce enough neutrophils. This problem may be caused by: Cancer. Cancer treatments, such as radiation or chemotherapy. Viral  infections. Medicines, such as phenytoin. Vitamin B12 deficiency. Diseases of the bone marrow. Environmental toxins, such as insecticides. What are the signs or symptoms? This condition does not usually cause symptoms. If symptoms are present, they are usually caused by an underlying infection. Symptoms of an infection may include: Fever. Chills. Swollen glands. Mouth ulcers. Cough. Rash or skin infection. Skin may be red, swollen, or painful. Abdominal or rectal pain. Frequent urination or pain or burning with urination. Because neutropenia weakens the immune system, symptoms of infection may be reduced. It is important to be aware of any changes in your body and talk to your health care provider. How is this diagnosed? This condition is diagnosed based on your medical history and a physical exam. Tests will also be done, such as: A complete blood count (CBC). Bone marrow biopsy. This is collecting a sample of bone marrow for testing. A chest X-ray. A urine culture. A blood culture. How is this treated? Treatment depends on the underlying cause and severity of your condition. Mild neutropenia may not require treatment. Treatment may include medicines, such as: Antibiotic medicine given through an IV. Antiviral medicines. Antifungal medicines. A medicine to increase production of neutrophils (colony-stimulating factor). You may get this medicine through an IV or by injection. Steroids given through an IV. If an underlying condition is causing neutropenia, you may need treatment for that condition. If medicines or cancer treatments are causing neutropenia, your health care provider may have you stop the medicines or treatment. Follow these instructions at home: Medicines  Take over-the-counter and prescription medicines only as told by your health care provider. Get an annual flu shot. Ask your health care provider whether you or anyone you live with needs any other  vaccines. Eating and drinking Do not share food utensils. Do not eat unpasteurized foods. Do not eat raw or undercooked meat, eggs, or seafood. Do not eat unwashed, raw fruits or vegetables. Lifestyle Avoid exposure to groups of people or children. Avoid being around people who are sick. Avoid being around live plants or fresh flowers. Avoid being around dirt or dust, such as in construction areas or gardens. Wear gloves if you are going to do yard work or gardening. Do not provide direct care for pets. Avoid animal droppings. Do not clean litter boxes and bird cages. Do not have sex unless your health care provider has approved. Hygiene  Bathe daily. Clean the area between the genitals and the anus (perineal area) after you urinate or have a bowel movement. If you are male, wipe from front to back. Get regular dental care and brush your teeth with a soft toothbrush before and after meals. Do not use a regular razor. Use an electric razor to remove hair. Wash your hands often with soap and water for at least 20 seconds. Make sure others who come in contact with you also wash their hands. If soap and water are not available, use hand sanitizer. General instructions Take steps to reduce your risk of injury or infection. Follow any precautions as told by your health care provider. Take actions to avoid cuts and burns. For example: Be cautious when you use knives. Always cut away from yourself. Keep knives  in protective sheaths or guards when not in use. Use oven mitts when you cook with a hot stove, oven, or grill. Stand a safe distance away from open fires. Do not use tampons, enemas, or rectal suppositories unless your health care provider has approved. Keep all follow-up visits. This is important. Contact a health care provider if: You have a cough. You have a sore throat. You develop sores in your mouth or anus. You have a warm, red, or tender area on your skin. You have red  streaks on the skin. You develop a rash. You have swollen lymph nodes. You have frequent or painful urination. You have vaginal discharge or itching. Get help right away if: You have a fever. You have chills or shaking. You have nausea or vomiting. You have a lot of fatigue. You have shortness of breath. Summary Neutropenia is a condition that occurs when you have a lower-than-normal level of a type of white blood cell (neutrophils) in your body. This condition can occur if your body uses up or destroys neutrophils faster than your bone marrow can make them. Treatment depends on the underlying cause and severity of your condition. Mild neutropenia may not require treatment. Follow any precautions as told by your health care provider to reduce your risk for injury or infection. This information is not intended to replace advice given to you by your health care provider. Make sure you discuss any questions you have with your health care provider. Document Revised: 02/22/2021 Document Reviewed: 02/22/2021 Elsevier Patient Education  2024 [employer].

## 2023-12-18 ENCOUNTER — Inpatient Hospital Stay

## 2023-12-18 ENCOUNTER — Inpatient Hospital Stay: Admitting: Hematology

## 2023-12-18 ENCOUNTER — Ambulatory Visit

## 2023-12-18 VITALS — BP 108/66 | HR 58 | Temp 98.1°F | Resp 18 | Ht 71.0 in | Wt 170.5 lb

## 2023-12-18 DIAGNOSIS — C9000 Multiple myeloma not having achieved remission: Secondary | ICD-10-CM | POA: Diagnosis not present

## 2023-12-18 DIAGNOSIS — Z5111 Encounter for antineoplastic chemotherapy: Secondary | ICD-10-CM

## 2023-12-18 DIAGNOSIS — Z5112 Encounter for antineoplastic immunotherapy: Secondary | ICD-10-CM | POA: Diagnosis not present

## 2023-12-18 LAB — CBC WITH DIFFERENTIAL (CANCER CENTER ONLY)
Abs Immature Granulocytes: 0.01 10*3/uL (ref 0.00–0.07)
Basophils Absolute: 0 10*3/uL (ref 0.0–0.1)
Basophils Relative: 1 %
Eosinophils Absolute: 0.1 10*3/uL (ref 0.0–0.5)
Eosinophils Relative: 1 %
HCT: 32.1 % — ABNORMAL LOW (ref 39.0–52.0)
Hemoglobin: 10.9 g/dL — ABNORMAL LOW (ref 13.0–17.0)
Immature Granulocytes: 0 %
Lymphocytes Relative: 28 %
Lymphs Abs: 1 10*3/uL (ref 0.7–4.0)
MCH: 32.8 pg (ref 26.0–34.0)
MCHC: 34 g/dL (ref 30.0–36.0)
MCV: 96.7 fL (ref 80.0–100.0)
Monocytes Absolute: 0.4 10*3/uL (ref 0.1–1.0)
Monocytes Relative: 11 %
Neutro Abs: 2 10*3/uL (ref 1.7–7.7)
Neutrophils Relative %: 59 %
Platelet Count: 221 10*3/uL (ref 150–400)
RBC: 3.32 MIL/uL — ABNORMAL LOW (ref 4.22–5.81)
RDW: 13.8 % (ref 11.5–15.5)
WBC Count: 3.5 10*3/uL — ABNORMAL LOW (ref 4.0–10.5)
nRBC: 0 % (ref 0.0–0.2)

## 2023-12-18 NOTE — Progress Notes (Signed)
 HEMATOLOGY/ONCOLOGY CLINIC NOTE  Date of Service: 12/18/23  Patient Care Team: Jimmey Mould, MD as PCP - General (Family Medicine) Alfornia Anis, Reeda Canner, MD as Referring Physician (Neurology)  CHIEF COMPLAINTS/PURPOSE OF CONSULTATION:  F/u for evaluation and management of newly diagnosed multiple myeloma  HISTORY OF PRESENTING ILLNESS:   Colin Calderon is a wonderful 73 y.o. male who has been referred to us  by Annett Bart, MD for evaluation and management of abnormal protein.   He presented to the ED on 07/06/2023 for pneumonia of both lungs due to infectious organism.   Today, he is accompanied by his wife and friend. 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 His anemia is gradually worsening, previously hgb was11.2, 10 months ago and more recently 9.7 a week ago.   He reports that he was having some numbness in his hands and feet present for 2-4 weeks, which caused him to see a neurologist. As part of the neurologist's workup to see if his symptoms are related to paraproteinemia, there were findings of abnormal protein with M protein level of 0.2 g/dL. His wife reports that he has a nerve conduction study planned for December.   He denies any new fatigue, unexplained fever, chills, night sweats, or unexpected weight loss. He reports a 3-pound weight loss based on his scale at home.   He reports that he recently had pneumonia which was not unusual as he did have pneumonia previosuly. Patient notes that he did develop a fever causing him to present to the ED. Patient denies any SOB at this time, but does endorse sporadic coughing. He denies any issues with urinary infections or sinus infections.   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   He reports having a slightly enlarged prostate.   Patient reports having his [facility] checked with [facility], but can't find the results. He reports a fhx of thyroid issues.   He denies any new leg  swelling, skin rashes, change in vision that is new or different, or headaches that are new or different. Patient denies any abdominal symptoms, such as abdominal pain or abdominal distention.    Patient denies having any chronic medical issues such as heart or lung issues.   He reports that he only takes B-12 replacement. Patient notes that he was previously B12 deficient in the past. He reports that the vitamin pills were not being absorbed.   Patient tends to be vegetarian for the most part, but does eats chicken occasionally.   He has no other medical issues.   He reports a surgical history of inguinal hernia surgery years ago most likely on the right side.   He reports an allergy to amoxicillin with rash reaction. He has not other medication allergies.   Patient does report a history of thyroid disorders in the family.  He reports that his brother passed away from leukemia. Patient reports that his father did have coronary issues 105  Mother 62 years  He is a never smoker and denies any alcohol consumption or use of other recreational chemicals.   Previously, patient was generally very active through activities including hiking, walking, and swimming. However, over the last couple of months he has endorsed new fatigue alongside worsening anemia. His wife reports that he has sometimes felt too tired to drive, which is unusual. She also noticed that he lags behind when they go for walks together over the last six months.   He complains of  poor sleep mostly due to urinary frequency, which is not a new symptom. 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 Patient reports very sensitive arms and legs. Patient reports that his mother did have "horrible" neuropathy and is constantly in pain.   His wife reports that patient previously had a loud voice and has lost his voice recently, which patient attributes to pneumonia. He denies any chest discomfort  at this time.   He did have thyroid function checked recently. Patient denies any obvious lumps/bumps.Patient denies any spine pain or abdominal pain.   INTERVAL HISTORY:  Colin Calderon is a wonderful 73 y.o. male who is here for continued evaluation and management of abnormal protein.  Patient was last seen by me on 11/29/2023 and reported a mild cough, worsened familial tremors, mild pain issues with his left thumb, related to carpal tunnel, non-persistent bilateral shoulder joint pains, attributed to heavy weight of jacket, and some emotional challenges in regards to the involved process of receiving cancer treatment. He also reported red skin rash on the left side of his trunk after receiving injection, which had resolved, as well as previously endorsed black/blue discoloration in his lip, which had resolved.   Today, he presents for toxicity check and is scheduled to receive cycle 2 day 15 of treatment on 12/19/2023. Patient is accompanied by his wife during this visit.   He has completed his first cycle of treatment and reports that he has taken six doses of revlimid so far. Patient denies any severe toxicities from Revlimid treatment. He denies any concerns for fever, chills, skin rashes, diarrhea, or cramping.  In regards to his carpal tunnel, his right hand is okay and improved after steroids shots. His left hand symptoms are stable, with issues primarily in the radial side of his hand near the thumb. He notes that he did try splinting previously, which did not improve symptoms.   His wife reports that patient has some drowsiness issues.   Patient denies any concern for nausea or vomiting. His previous skin rashes have resolved.   He does stay well hydrated.   Patient reports that his weight has been fairly stable. His weight in clinic today is 170 pounds.   He denies any back pain or other new bone pains.   Patient has received the RSV vaccine.  He has not received the shingles  vaccine.   Patient reports some emotional frustration in regards to his involved treatment process.   He reports that he has an appointment with Dr. Bernetta Brilliant to discuss details of bone marrow transplant in 3 weeks, which is close to the end of his second cycle of treatment.   Patient is hoping to travel [place] to visit family in August 2025.  MEDICAL HISTORY:  No past medical history on file.  SURGICAL HISTORY: Past Surgical History:  Procedure Laterality Date   HERNIA REPAIR      SOCIAL HISTORY: Social History   Socioeconomic History   Marital status: Married    Spouse name: Not on file   Number of children: Not on file   Years of education: Not on file   Highest education level: Not on file  Occupational History   Not on file  Tobacco Use   Smoking status: Never   Smokeless tobacco: Not on file  Substance and Sexual Activity   Alcohol use: Yes   Drug use: No   Sexual activity: Not on file  Other Topics  Concern   Not on file  Social History Narrative   Not on file   Social Drivers of Health   Financial Resource Strain: Low Risk  (07/01/2023)   Overall Financial Resource Strain (CARDIA)    Difficulty of Paying Living Expenses: Not hard at all  Food Insecurity: Low Risk  (10/16/2023)   Received from [facility]   Hunger Vital Sign    Worried About Running Out of Food in the Last Year: Never true    Ran Out of Food in the Last Year: Never true  Transportation Needs: No Transportation Needs (10/16/2023)   Received from [facility]    In the past 12 months, has lack of reliable transportation kept you from medical appointments, meetings, work or from getting things needed for daily living? : No  Physical Activity: Sufficiently Active (07/01/2023)   Exercise Vital Sign    Days of Exercise per Week: 7 days    Minutes of Exercise per Session: 40 min  Stress: No Stress Concern Present (07/01/2023)   [employer] of Occupational Health -  Occupational Stress Questionnaire    Feeling of Stress : Not at all  Social Connections: Socially Integrated (07/01/2023)   Social Connection and Isolation Panel [NHANES]    Frequency of Communication with Friends and Family: Not on file    Frequency of Social Gatherings with Friends and Family: Three times a week    Attends Religious Services: More than 4 times per year    Active Member of Clubs or Organizations: Yes    Attends Banker Meetings: More than 4 times per year    Marital Status: Married  Catering manager Violence: Not At Risk (07/01/2023)   Humiliation, Afraid, Rape, and Kick questionnaire    Fear of Current or Ex-Partner: No    Emotionally Abused: No    Physically Abused: No    Sexually Abused: No    FAMILY HISTORY: No family history on file.  ALLERGIES:  is allergic to amoxicillin.  MEDICATIONS:  Current Outpatient Medications  Medication Sig Dispense Refill   acyclovir (ZOVIRAX) 400 MG tablet Take 1 tablet (400 mg total) by mouth 2 (two) times daily. 60 tablet 5   aspirin EC 81 MG tablet Take 1 tablet (81 mg total) by mouth daily. 100 tablet 3   azithromycin (ZITHROMAX) 250 MG tablet Take 1 tablet (250 mg total) by mouth daily. Take first 2 tablets together, then 1 every day until finished. (Patient not taking: Reported on 11/07/2023) 6 tablet 0   benzonatate (TESSALON) 100 MG capsule Take 1 capsule (100 mg total) by mouth every 8 (eight) hours. (Patient not taking: Reported on 11/07/2023) 21 capsule 0   Cobalamin Combinations (B-12) [phone] MCG SUBL Take 1 tablet by mouth daily.     Cyanocobalamin 5000 MCG/ML LIQD 1 tablet 1 day or 1 dose.     dexamethasone (DECADRON) 4 MG tablet Take 5 tabs (20 mg) weekly the day after daratumumab for 12 weeks. Take with breakfast. 20 tablet 5   lenalidomide (REVLIMID) 15 MG capsule Take 1 capsule (15 mg total) by mouth daily. Take for 21 days on, 7 days off, repeat every 21 days x 6. 21 capsule 0   ondansetron (ZOFRAN)  8 MG tablet Take 1 tablet (8 mg total) by mouth every 8 (eight) hours as needed for nausea or vomiting. 30 tablet 1   prochlorperazine (COMPAZINE) 10 MG tablet Take 1 tablet (10 mg total) by mouth every 6 (six) hours as  needed for nausea or vomiting. 30 tablet 1   No current facility-administered medications for this visit.    REVIEW OF SYSTEMS:    10 Point review of Systems was done is negative except as noted above.   PHYSICAL EXAMINATION: ECOG PERFORMANCE STATUS: 1 - Symptomatic but completely ambulatory  . Vitals:   12/18/23 1220  BP: 108/66  Pulse: (!) 58  Resp: 18  Temp: 98.1 F (36.7 C)  SpO2: 98%     Filed Weights   12/18/23 1220  Weight: 170 lb 8 oz (77.3 kg)   .Body mass index is 23.78 kg/m. GENERAL:alert, in no acute distress and comfortable SKIN: no acute rashes, no significant lesions EYES: conjunctiva are pink and non-injected, sclera anicteric OROPHARYNX: MMM, no exudates, no oropharyngeal erythema or ulceration NECK: supple, no JVD LYMPH:  no palpable lymphadenopathy in the cervical, axillary or inguinal regions LUNGS: clear to auscultation b/l with normal respiratory effort HEART: regular rate & rhythm ABDOMEN:  normoactive bowel sounds , non tender, not distended. Extremity: no pedal edema PSYCH: alert & oriented x 3 with fluent speech NEURO: no focal motor/sensory deficits   LABORATORY DATA:  I have reviewed the data as listed .    Latest Ref Rng & Units 12/18/2023   12:04 PM 12/12/2023    1:50 PM 12/06/2023    1:12 PM  CBC  WBC 4.0 - 10.5 K/uL 3.5  4.1  3.4   Hemoglobin 13.0 - 17.0 g/dL 16.1  09.6  04.5   Hematocrit 39.0 - 52.0 % 32.1  32.2  32.9   Platelets 150 - 400 K/uL 221  296  271     .    Latest Ref Rng & Units 12/12/2023    1:50 PM 12/06/2023    1:12 PM 11/29/2023   12:48 PM  CMP  Glucose 70 - 99 mg/dL 409  93  87   BUN 8 - 23 mg/dL 19  24  23    Creatinine 0.61 - 1.24 mg/dL 8.11  9.14  7.82   Sodium 135 - 145 mmol/L 143  140  141    Potassium 3.5 - 5.1 mmol/L 4.2  4.5  4.7   Chloride 98 - 111 mmol/L 107  108  109   CO2 22 - 32 mmol/L 32  29  28   Calcium 8.9 - 10.3 mg/dL 8.7  9.2  8.3   Total Protein 6.5 - 8.1 g/dL 5.7  6.0  5.6   Total Bilirubin 0.0 - 1.2 mg/dL 0.4  0.5  0.4   Alkaline Phos 38 - 126 U/L 47  47  41   AST 15 - 41 U/L 11  14  11    ALT 0 - 44 U/L 14  17  13       07/17/2023 Surgical pathology:   07/17/2023 Surgical pathology:      RADIOGRAPHIC STUDIES: I have personally reviewed the radiological images as listed and agreed with the findings in the report. No results found.  ASSESSMENT & PLAN:   73 y.o. 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 Recently diagnosed Active Multiple myeloma -Kappa light chain producing.  BM Bx showing a second population of lymphoplasmacytoid cells which are 90% of the lymphoplasmacytoid cells which are CD20+ve and neg for CD5 and CD10 and show Kappa light chain  restriction. 3. 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  4. 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  -Discussed lab results on 12/18/23 in detail with patient. CBC showed WBC of 3.5K, hemoglobin of 10.9, and platelets of 221K. -Neutrophills normal at 2K. His neutrophils were noted to previously be low at 0.2 four weeks ago -discussed that the chance of neutropenia is lower since he is on a slightly lower dose -hgb shows mild anemia, which is stable with no major changes. I anticipate that this will improve as myeloma improves -platelets normal -treatment is not suppressing blood counts at  this time with new dose of Revlimid -CMP stable -kappa light chains significantly dropped from nearly 700 to 230s with one cycle of treatment. -discussed that more than 50% decrease is already partial response. Discussed goal of 90% and 100% response.  -discussed that Benadryl can cause some drowsiness. Discussed option to switch to non-sedating medication -patient is inclined to continue current Benadryl premedication as he is tolerating it well with no major issues -Discussed that his condition is not curable at this time. Discussed the goal to control his disease in such a way that it is not affecting his quality of life, and to control the disease as long as we can.  -discussed that the pace of new cancer treatment is increasing exponentially over time -patient has tolerated treatment well without any major toxicities and he shall continue  -continue current dose of revlimid at this time  -discussed that there may be a role to push treatment dose if his blood counts improve and if needed depending on his response with each cycle of treatment.  -discussed that after 2-3 cycles, depending on his response, we shall decide whether or not to push Velcade or not in the context of his carpal tunnel -I am not inclined to push velcade at this time in order to limit toxicities -patient is tolerating his daratumumab/Revlimid/Dexamethasone treatment well with no major toxicities  -continue daratumumab injections once a week until cycle 2 and then every 2 weeks for 4 months, then once a month after that  -discussed option to adjust daratumumab schedule if he chooses to due to travel needs -discussed details of induction treatment -discussed that there would be a role for 4-6 or sometimes 8 cycles of treatment with the goal of achieving the deepest response.  -discussed that from a response parameter, the primary goal is to continue to get kappa free light chain as close to 20 as possible with normalized K/L  ratio -discussed that if his blood parameters are looking good, there may be a role for repeat bone marrow biopsy and PET scan if needed. He shall then decide whether he is inclined to consider bone marrow transplant or maintenance treamtent in 3-4 months, which would require a discussion down the line -discussed that bone marrow transplant and directly switching to maintenance treatment would both be reasonable options -patient has a scheduled appointment with Dr. Bernetta Brilliant to discuss bone marrow transplant option -discussed details of the process of an autologous bone marrow transplant, involving Melphalan high dose chemotherapy. Discussed that a bone marrow transplant would not be curative, and the goal would be to achieve as deep a resonse as possible for as long as possible. Discussed that there would still be a need for  maintenance treatment after the bone marrow recovers if he chooses this option. Discussed that maintenance treatment would start on day 100 after bone marrow transplant.  -discussed option to switch to maintance treatment directly without bone marrow transplant -maintenance treatment would involve Revlimid with labs every 2 months -educated patient on details BiTE therapies involving bispecific antibodies -connect with Dr. Bernetta Brilliant to discuss potential clinical trials options down the line such as BiTE therapies.  -Discussed details of CAR-T cell treatment. Discussed that CAR-T cell therapies are not typically done as first line treatment and is mainly considered down the line as 2nd/4th/5th line treatment options.  -advised patient to take reasonable precautions especially while traveling, including wearing face masks, washing hands, and staying UTD with age-appropriate vaccines -reasonable to receive the COVID-19 vaccine . Recommend to avoid receiving the COVID-19 vaccine within 2 months of receiving PET scan to prevent false positive lymph nodes -discussed that whether or not it is  recommended he receive shingrix would be dependent on whether he chooses to proceed with bone marrow transplant or not. If he does intend to have a bone marrow transplant, then he should receive Shingrix after the transplant. If he does not choose to receive bone marrow transplant, then he shall receive shingrix once he is on maintenance treatment. Discussed that he may not respond as well to shingrix vaccine during induction treatment.  -MMR vaccine is not an acute consideration at this point -continue vitamin D and B complex supplements  -answered all of patient's and his wife's questions in detail  FOLLOW-UP: -Per integrated scheduling - plz schedule remaining C2 , C3 and C4 of treatment -MD visit with C3D1 and C4D1  The total time spent in the appointment was 40 minutes* .  All of the patient's questions were answered with apparent satisfaction. The patient knows to call the clinic with any problems, questions or concerns.   Jacquelyn Matt MD MS AAHIVMS [facility] Hematology/Oncology Physician [facility]  .*Total Encounter Time as defined by the Centers for Medicare and Medicaid Services includes, in addition to the face-to-face time of a patient visit (documented in the note above) non-face-to-face time: obtaining and reviewing outside history, ordering and reviewing medications, tests or procedures, care coordination (communications with other health care professionals or caregivers) and documentation in the medical record.    I,Mitra Faeizi,acting as a Neurosurgeon for Jacquelyn Matt, MD.,have documented all relevant documentation on the behalf of Jacquelyn Matt, MD,as directed by  Jacquelyn Matt, MD while in the presence of Jacquelyn Matt, MD.  .I have reviewed the above documentation for accuracy and completeness, and I agree with the above. .Kamauri Denardo Kishore Mariesha Venturella MD

## 2023-12-18 NOTE — Progress Notes (Signed)
 Patient seen by Dr. Addison Naegeli are within treatment parameters.  Labs reviewed: and are within treatment parameters.  Per physician team, patient is ready for treatment and there are NO modifications to the treatment plan. Pt for tx  tomorrow 4/10

## 2023-12-19 ENCOUNTER — Ambulatory Visit

## 2023-12-19 ENCOUNTER — Inpatient Hospital Stay

## 2023-12-19 ENCOUNTER — Other Ambulatory Visit

## 2023-12-19 VITALS — BP 116/62 | HR 55 | Temp 98.2°F | Resp 16 | Wt 170.5 lb

## 2023-12-19 DIAGNOSIS — Z5112 Encounter for antineoplastic immunotherapy: Secondary | ICD-10-CM | POA: Diagnosis not present

## 2023-12-19 DIAGNOSIS — H9193 Unspecified hearing loss, bilateral: Secondary | ICD-10-CM | POA: Diagnosis not present

## 2023-12-19 DIAGNOSIS — H6123 Impacted cerumen, bilateral: Secondary | ICD-10-CM | POA: Diagnosis not present

## 2023-12-19 DIAGNOSIS — C9000 Multiple myeloma not having achieved remission: Secondary | ICD-10-CM

## 2023-12-19 DIAGNOSIS — H6993 Unspecified Eustachian tube disorder, bilateral: Secondary | ICD-10-CM | POA: Diagnosis not present

## 2023-12-19 DIAGNOSIS — H9313 Tinnitus, bilateral: Secondary | ICD-10-CM | POA: Diagnosis not present

## 2023-12-19 DIAGNOSIS — H903 Sensorineural hearing loss, bilateral: Secondary | ICD-10-CM | POA: Diagnosis not present

## 2023-12-19 MED ORDER — DEXAMETHASONE 4 MG PO TABS
20.0000 mg | ORAL_TABLET | Freq: Once | ORAL | Status: AC
  Administered 2023-12-19: 20 mg via ORAL
  Filled 2023-12-19 (×2): qty 5

## 2023-12-19 MED ORDER — DARATUMUMAB-HYALURONIDASE-FIHJ 1800-30000 MG-UT/15ML ~~LOC~~ SOLN
1800.0000 mg | Freq: Once | SUBCUTANEOUS | Status: AC
  Administered 2023-12-19: 1800 mg via SUBCUTANEOUS
  Filled 2023-12-19: qty 15

## 2023-12-19 MED ORDER — DIPHENHYDRAMINE HCL 25 MG PO CAPS
50.0000 mg | ORAL_CAPSULE | Freq: Once | ORAL | Status: AC
  Administered 2023-12-19: 50 mg via ORAL
  Filled 2023-12-19: qty 2

## 2023-12-19 MED ORDER — ACETAMINOPHEN 325 MG PO TABS
650.0000 mg | ORAL_TABLET | Freq: Once | ORAL | Status: AC
  Administered 2023-12-19: 650 mg via ORAL
  Filled 2023-12-19: qty 2

## 2023-12-19 NOTE — Patient Instructions (Signed)
 [facility] - A DEPT OF [facility]  Discharge Instructions: Thank you for choosing [facility] to provide your oncology and hematology care.   If you have a lab appointment with the Cancer Center, please go directly to the Cancer Center and check in at the registration area.   Wear comfortable clothing and clothing appropriate for easy access to any Portacath or PICC line.   We strive to give you quality time with your provider. You may need to reschedule your appointment if you arrive late (15 or more minutes).  Arriving late affects you and other patients whose appointments are after yours.  Also, if you miss three or more appointments without notifying the office, you may be dismissed from the clinic at the provider's discretion.      For prescription refill requests, have your pharmacy contact our office and allow 72 hours for refills to be completed.    Today you received the following chemotherapy and/or immunotherapy agent: Daratumumab (Darzalex Faspro)      To help prevent nausea and vomiting after your treatment, we encourage you to take your nausea medication as directed.  BELOW ARE SYMPTOMS THAT SHOULD BE REPORTED IMMEDIATELY: *FEVER GREATER THAN 100.4 F (38 C) OR HIGHER *CHILLS OR SWEATING *NAUSEA AND VOMITING THAT IS NOT CONTROLLED WITH YOUR NAUSEA MEDICATION *UNUSUAL SHORTNESS OF BREATH *UNUSUAL BRUISING OR BLEEDING *URINARY PROBLEMS (pain or burning when urinating, or frequent urination) *BOWEL PROBLEMS (unusual diarrhea, constipation, pain near the anus) TENDERNESS IN MOUTH AND THROAT WITH OR WITHOUT PRESENCE OF ULCERS (sore throat, sores in mouth, or a toothache) UNUSUAL RASH, SWELLING OR PAIN  UNUSUAL VAGINAL DISCHARGE OR ITCHING   Items with * indicate a potential emergency and should be followed up as soon as possible or go to the Emergency Department if any problems should occur.  Please show the CHEMOTHERAPY ALERT  CARD or IMMUNOTHERAPY ALERT CARD at check-in to the Emergency Department and triage nurse.  Should you have questions after your visit or need to cancel or reschedule your appointment, please contact [facility] - [facility] DE[facility]  Dept: [phone]  and follow the prompts.  Office hours are 8:00 a.m. to 4:30 p.m. Monday - Friday. Please note that voicemails left after 4:00 p.m. may not be returned until the following business day.  We are closed weekends and major holidays. You have access to a nurse at all times for urgent questions. Please call the main number to the clinic Dept: [phone] and follow the prompts.   For any non-urgent questions, you may also contact your provider using MyChart. We now offer e-Visits for anyone 58 and older to request care online for non-urgent symptoms. For details visit [email or web address].   Also download the MyChart app! Go to the app store, search "MyChart", open the app, select , and log in with your MyChart username and password.

## 2023-12-19 NOTE — Progress Notes (Signed)
 Per Dr Candise Che Pt is okay for tx today with slight sore throat.

## 2023-12-20 ENCOUNTER — Other Ambulatory Visit: Payer: Self-pay

## 2023-12-23 ENCOUNTER — Other Ambulatory Visit: Payer: Self-pay

## 2023-12-23 DIAGNOSIS — C9000 Multiple myeloma not having achieved remission: Secondary | ICD-10-CM

## 2023-12-23 MED ORDER — LENALIDOMIDE 15 MG PO CAPS: 15.0000 mg | ORAL_CAPSULE | Freq: Every day | ORAL | 0 refills | Status: DC

## 2023-12-24 ENCOUNTER — Encounter: Payer: Self-pay | Admitting: Hematology

## 2023-12-25 ENCOUNTER — Encounter: Payer: Self-pay | Admitting: Hematology

## 2023-12-26 ENCOUNTER — Other Ambulatory Visit: Payer: Self-pay | Admitting: Hematology and Oncology

## 2023-12-26 ENCOUNTER — Inpatient Hospital Stay

## 2023-12-26 DIAGNOSIS — C9000 Multiple myeloma not having achieved remission: Secondary | ICD-10-CM

## 2023-12-26 DIAGNOSIS — Z5112 Encounter for antineoplastic immunotherapy: Secondary | ICD-10-CM | POA: Diagnosis not present

## 2023-12-26 LAB — CBC WITH DIFFERENTIAL (CANCER CENTER ONLY)
Abs Immature Granulocytes: 0 10*3/uL (ref 0.00–0.07)
Basophils Absolute: 0 10*3/uL (ref 0.0–0.1)
Basophils Relative: 3 %
Eosinophils Absolute: 0.2 10*3/uL (ref 0.0–0.5)
Eosinophils Relative: 14 %
HCT: 31.2 % — ABNORMAL LOW (ref 39.0–52.0)
Hemoglobin: 10.8 g/dL — ABNORMAL LOW (ref 13.0–17.0)
Immature Granulocytes: 0 %
Lymphocytes Relative: 50 %
Lymphs Abs: 0.6 10*3/uL — ABNORMAL LOW (ref 0.7–4.0)
MCH: 33.2 pg (ref 26.0–34.0)
MCHC: 34.6 g/dL (ref 30.0–36.0)
MCV: 96 fL (ref 80.0–100.0)
Monocytes Absolute: 0.2 10*3/uL (ref 0.1–1.0)
Monocytes Relative: 12 %
Neutro Abs: 0.3 10*3/uL — CL (ref 1.7–7.7)
Neutrophils Relative %: 21 %
Platelet Count: 142 10*3/uL — ABNORMAL LOW (ref 150–400)
RBC: 3.25 MIL/uL — ABNORMAL LOW (ref 4.22–5.81)
RDW: 13.2 % (ref 11.5–15.5)
WBC Count: 1.2 10*3/uL — ABNORMAL LOW (ref 4.0–10.5)
nRBC: 0 % (ref 0.0–0.2)

## 2023-12-26 NOTE — Progress Notes (Signed)
 CRITICAL VALUE STICKER  CRITICAL VALUE: ANC: 0.3  DATE & TIME NOTIFIED: 12/26/23  13:50  MD NOTIFIED: Rosaline Coma  TIME OF NOTIFICATION:13:52

## 2023-12-27 ENCOUNTER — Emergency Department (HOSPITAL_BASED_OUTPATIENT_CLINIC_OR_DEPARTMENT_OTHER)
Admission: EM | Admit: 2023-12-27 | Discharge: 2023-12-28 | Disposition: A | Discharge status: Home / Self Care | Attending: Emergency Medicine | Admitting: Emergency Medicine

## 2023-12-27 ENCOUNTER — Other Ambulatory Visit: Payer: Self-pay

## 2023-12-27 ENCOUNTER — Encounter (HOSPITAL_BASED_OUTPATIENT_CLINIC_OR_DEPARTMENT_OTHER): Payer: Self-pay

## 2023-12-27 ENCOUNTER — Emergency Department (HOSPITAL_BASED_OUTPATIENT_CLINIC_OR_DEPARTMENT_OTHER): Admitting: Radiology

## 2023-12-27 DIAGNOSIS — R0981 Nasal congestion: Secondary | ICD-10-CM | POA: Diagnosis not present

## 2023-12-27 DIAGNOSIS — R350 Frequency of micturition: Secondary | ICD-10-CM | POA: Insufficient documentation

## 2023-12-27 DIAGNOSIS — Z7982 Long term (current) use of aspirin: Secondary | ICD-10-CM | POA: Insufficient documentation

## 2023-12-27 DIAGNOSIS — R3915 Urgency of urination: Secondary | ICD-10-CM | POA: Insufficient documentation

## 2023-12-27 DIAGNOSIS — R509 Fever, unspecified: Secondary | ICD-10-CM | POA: Diagnosis present

## 2023-12-27 DIAGNOSIS — Z8579 Personal history of other malignant neoplasms of lymphoid, hematopoietic and related tissues: Secondary | ICD-10-CM | POA: Diagnosis not present

## 2023-12-27 DIAGNOSIS — D709 Neutropenia, unspecified: Secondary | ICD-10-CM | POA: Diagnosis not present

## 2023-12-27 DIAGNOSIS — Z7961 Long term (current) use of immunomodulator: Secondary | ICD-10-CM | POA: Diagnosis not present

## 2023-12-27 DIAGNOSIS — R5081 Fever presenting with conditions classified elsewhere: Secondary | ICD-10-CM | POA: Insufficient documentation

## 2023-12-27 DIAGNOSIS — R0989 Other specified symptoms and signs involving the circulatory and respiratory systems: Secondary | ICD-10-CM | POA: Diagnosis not present

## 2023-12-27 LAB — URINALYSIS, W/ REFLEX TO CULTURE (INFECTION SUSPECTED)
Bacteria, UA: NONE SEEN
Bilirubin Urine: NEGATIVE
Glucose, UA: NEGATIVE mg/dL
Hgb urine dipstick: NEGATIVE
Ketones, ur: NEGATIVE mg/dL
Leukocytes,Ua: NEGATIVE
Nitrite: NEGATIVE
Protein, ur: 30 mg/dL — AB
Specific Gravity, Urine: 1.031 — ABNORMAL HIGH (ref 1.005–1.030)
pH: 5.5 (ref 5.0–8.0)

## 2023-12-27 LAB — CBC WITH DIFFERENTIAL/PLATELET
Abs Immature Granulocytes: 0 10*3/uL (ref 0.00–0.07)
Basophils Absolute: 0 10*3/uL (ref 0.0–0.1)
Basophils Relative: 1 %
Eosinophils Absolute: 0.2 10*3/uL (ref 0.0–0.5)
Eosinophils Relative: 12 %
HCT: 29.8 % — ABNORMAL LOW (ref 39.0–52.0)
Hemoglobin: 10.5 g/dL — ABNORMAL LOW (ref 13.0–17.0)
Immature Granulocytes: 0 %
Lymphocytes Relative: 45 %
Lymphs Abs: 0.6 10*3/uL — ABNORMAL LOW (ref 0.7–4.0)
MCH: 33.8 pg (ref 26.0–34.0)
MCHC: 35.2 g/dL (ref 30.0–36.0)
MCV: 95.8 fL (ref 80.0–100.0)
Monocytes Absolute: 0.1 10*3/uL (ref 0.1–1.0)
Monocytes Relative: 10 %
Neutro Abs: 0.4 10*3/uL — CL (ref 1.7–7.7)
Neutrophils Relative %: 31 %
Platelets: 184 10*3/uL (ref 150–400)
RBC: 3.11 MIL/uL — ABNORMAL LOW (ref 4.22–5.81)
RDW: 13.5 % (ref 11.5–15.5)
WBC: 1.4 10*3/uL — CL (ref 4.0–10.5)
nRBC: 0 % (ref 0.0–0.2)
nRBC: 0 /100{WBCs}

## 2023-12-27 LAB — COMPREHENSIVE METABOLIC PANEL WITH GFR
ALT: 8 U/L (ref 0–44)
AST: 8 U/L — ABNORMAL LOW (ref 15–41)
Albumin: 3.9 g/dL (ref 3.5–5.0)
Alkaline Phosphatase: 40 U/L (ref 38–126)
Anion gap: 8 (ref 5–15)
BUN: 21 mg/dL (ref 8–23)
CO2: 25 mmol/L (ref 22–32)
Calcium: 8.4 mg/dL — ABNORMAL LOW (ref 8.9–10.3)
Chloride: 102 mmol/L (ref 98–111)
Creatinine, Ser: 0.89 mg/dL (ref 0.61–1.24)
GFR, Estimated: >60 mL/min (ref >60)
Glucose, Bld: 101 mg/dL — ABNORMAL HIGH (ref 70–99)
Potassium: 4.3 mmol/L (ref 3.5–5.1)
Sodium: 135 mmol/L (ref 135–145)
Total Bilirubin: 0.5 mg/dL (ref 0.0–1.2)
Total Protein: 5.6 g/dL — ABNORMAL LOW (ref 6.5–8.1)

## 2023-12-27 LAB — RESP PANEL BY RT-PCR (RSV, FLU A&B, COVID)  RVPGX2
Influenza A by PCR: NEGATIVE
Influenza B by PCR: NEGATIVE
Resp Syncytial Virus by PCR: NEGATIVE
SARS Coronavirus 2 by RT PCR: NEGATIVE

## 2023-12-27 LAB — PROTIME-INR
INR: 1.1 (ref 0.8–1.2)
Prothrombin Time: 14 s (ref 11.4–15.2)

## 2023-12-27 LAB — LACTIC ACID, PLASMA: Lactic Acid, Venous: 0.5 mmol/L (ref 0.5–1.9)

## 2023-12-27 MED ORDER — LACTATED RINGERS IV SOLN: INTRAVENOUS | Status: DC

## 2023-12-27 MED ORDER — LACTATED RINGERS IV BOLUS
500.0000 mL | Freq: Once | INTRAVENOUS | Status: AC
  Administered 2023-12-27: 500 mL via INTRAVENOUS

## 2023-12-27 NOTE — ED Triage Notes (Signed)
 Pt reports he is here today due to fevers at home today of 100.4. Pt reports he was told to come into the ER if he ever develops a fever. Pt reports he is a cancer patient. Pt reports last chemo treatment was last Thursday. Pt reports nasal congestion,cough,running nose.

## 2023-12-27 NOTE — ED Provider Notes (Signed)
  Provider Note MRN:  [id number]  Arrival date & time: 12/28/23    ED Course and Medical Decision Making  Assumed care of patient at sign-out or upon transfer.  Neutropenic fever, URI symptoms, will try to touch base with oncology.  Patient hesitant to be admitted, will consider outpatient antibiotics and close follow-up.  12:15 AM update: Patient and patient's wife getting restless, do not want to be here any longer.  Still no response from oncology.  Patient continues to look and feel well with normal vital signs, resting comfortably.  Based on patient's MAS CC risk index for febrile neutropenia, low risk for poor outcome.  Shared decision making utilized with patient, admission of course still offered the patient is definitely not interested in this.  We reached a common ground decision to provide patient with a dose of antibiotics here in the emergency department, outpatient antibiotics, with a will call their oncology office in the morning.  Very strict return precautions provided.  Blood cultures pending.  Procedures  Final Clinical Impressions(s) / ED Diagnoses     ICD-10-CM   1. Neutropenic fever (HCC)  D70.9    R50.81       ED Discharge Orders          Ordered    cefdinir  (OMNICEF ) 300 MG capsule  2 times daily        12/28/23 0014              Discharge Instructions      You were evaluated in the Emergency Department and after careful evaluation, we did not find any emergent condition requiring admission or further testing in the hospital.  Your exam/testing today is overall reassuring.  We discussed management options and decided to give you a dose of IV antibiotics and have you closely follow-up with your oncology team.  Take the Omnicef  medication as directed.  Please return to the Emergency Department if you experience any worsening of your condition.   Thank you for allowing us  to be a part of your care.       Merrick Abe. Harless Lien, MD [facility]  Medicine [facility] [email or web address]    Edson Graces, MD 12/28/23 [phone]

## 2023-12-27 NOTE — ED Provider Notes (Signed)
 [facility] AT [facility] Provider Note   CSN: [id number] Arrival date & time: 12/27/23  2019     History  Chief Complaint  Patient presents with   Fever    Colin Calderon is a 73 y.o. male.  Patient is a 73 year old male with a history of multiple myeloma on Revlimid  and a biologic agent.  He is on his second 21-day cycle and had completed 20 days of it but on Thursday had an absolute neutrophil count of 300 and they discontinued all of his chemo medication as well as his biologic medication.  Patient reports for the last 8 days he has had URI symptoms with clear nasal drainage, cough and just feeling generally rundown.  He reports last night he started feeling warm but his temperature was 99.  It was normal throughout the day today but then tonight started feeling feverish again and checked and his temperature was 100.4.  He has not had any chest pain, abdominal pain, nausea or vomiting.  He denies shortness of breath but his wife feels that he is seemed a little bit winded.  He also complains of issues with urinary urgency and frequency but denies any dysuria.  States that this has been going on for a while and thought maybe he had prostate issues.  The history is provided by the patient and medical records.  Fever      Home Medications Prior to Admission medications   Medication Sig Start Date End Date Taking? Authorizing Provider  acyclovir  (ZOVIRAX ) 400 MG tablet Take 1 tablet (400 mg total) by mouth 2 (two) times daily. 10/24/23   [name], MD  aspirin  EC 81 MG tablet Take 1 tablet (81 mg total) by mouth daily. 10/24/23   Kale, Gautam Kishore, MD  azithromycin  (ZITHROMAX ) 250 MG tablet Take 1 tablet (250 mg total) by mouth daily. Take first 2 tablets together, then 1 every day until finished. Patient not taking: Reported on 11/07/2023 07/06/23   Henderly, Britni A, PA-C  benzonatate  (TESSALON ) 100 MG capsule Take 1 capsule (100 mg  total) by mouth every 8 (eight) hours. Patient not taking: Reported on 11/07/2023 07/06/23   Henderly, Britni A, PA-C  Cobalamin Combinations (B-12) [phone] MCG SUBL Take 1 tablet by mouth daily.    [provider]  Cyanocobalamin  5000 MCG/ML LIQD 1 tablet 1 day or 1 dose.    [provider]  dexamethasone  (DECADRON ) 4 MG tablet Take 5 tabs (20 mg) weekly the day after daratumumab  for 12 weeks. Take with breakfast. 11/29/23   [name], MD  lenalidomide  (REVLIMID ) 15 MG capsule Take 1 capsule (15 mg total) by mouth daily. Take for 21 days on, 7 days off, repeat every 21 days x 6. 12/23/23   [name], MD  ondansetron  (ZOFRAN ) 8 MG tablet Take 1 tablet (8 mg total) by mouth every 8 (eight) hours as needed for nausea or vomiting. 10/24/23   [name], MD  prochlorperazine  (COMPAZINE ) 10 MG tablet Take 1 tablet (10 mg total) by mouth every 6 (six) hours as needed for nausea or vomiting. 10/24/23   [name], MD      Allergies    Amoxicillin    Review of Systems   Review of Systems  Constitutional:  Positive for fever.    Physical Exam Updated Vital Signs BP 114/70 (BP Location: Right Arm)   Pulse 77   Temp 99.7 F (37.6 C) (Oral)   Resp ROLLEN)  24   SpO2 99%  Physical Exam Vitals and nursing note reviewed.  Constitutional:      General: He is not in acute distress.    Appearance: He is well-developed.  HENT:     Head: Normocephalic and atraumatic.     Right Ear: Tympanic membrane normal.     Left Ear: Tympanic membrane normal.     Nose: Congestion present.     Mouth/Throat:     Mouth: Mucous membranes are moist.  Eyes:     Conjunctiva/sclera: Conjunctivae normal.     Pupils: Pupils are equal, round, and reactive to light.  Cardiovascular:     Rate and Rhythm: Normal rate and regular rhythm.     Heart sounds: No murmur heard. Pulmonary:     Effort: Pulmonary effort is normal. No respiratory distress.     Breath sounds:  Normal breath sounds. No wheezing or rales.  Abdominal:     General: There is no distension.     Palpations: Abdomen is soft.     Tenderness: There is no abdominal tenderness. There is no right CVA tenderness, left CVA tenderness, guarding or rebound.  Musculoskeletal:        General: No tenderness. Normal range of motion.     Cervical back: Normal range of motion and neck supple.     Right lower leg: No edema.     Left lower leg: No edema.  Skin:    General: Skin is warm and dry.     Findings: No erythema or rash.  Neurological:     Mental Status: He is alert and oriented to person, place, and time. Mental status is at baseline.  Psychiatric:        Behavior: Behavior normal.     ED Results / Procedures / Treatments   Labs (all labs ordered are listed, but only abnormal results are displayed) Labs Reviewed  CBC WITH DIFFERENTIAL/PLATELET - Abnormal; Notable for the following components:      Result Value   WBC 1.4 (*)    RBC 3.11 (*)    Hemoglobin 10.5 (*)    HCT 29.8 (*)    Neutro Abs 0.4 (*)    Lymphs Abs 0.6 (*)    All other components within normal limits  URINALYSIS, W/ REFLEX TO CULTURE (INFECTION SUSPECTED) - Abnormal; Notable for the following components:   Specific Gravity, Urine 1.031 (*)    Protein, ur 30 (*)    All other components within normal limits  COMPREHENSIVE METABOLIC PANEL WITH GFR - Abnormal; Notable for the following components:   Glucose, Bld 101 (*)    Calcium 8.4 (*)    Total Protein 5.6 (*)    AST 8 (*)    All other components within normal limits  RESP PANEL BY RT-PCR (RSV, FLU A&B, COVID)  RVPGX2  CULTURE, BLOOD (ROUTINE X 2)  CULTURE, BLOOD (ROUTINE X 2)  LACTIC ACID, PLASMA  PROTIME-INR    EKG None  Radiology No results found.  Procedures Procedures    Medications Ordered in ED Medications  lactated ringers  infusion ( Intravenous New Bag/Given 12/27/23 2253)  lactated ringers  bolus 500 mL (0 mLs Intravenous Stopped 12/27/23  2253)    ED Course/ Medical Decision Making/ A&P                                 Medical Decision Making Amount and/or Complexity of Data Reviewed Labs: ordered. Radiology: ordered.  Risk Prescription drug management.   Pt with multiple medical problems and comorbidities and presenting today with a complaint that caries a high risk for morbidity and mortality.  Here today with fever and concern for possible pancytopenia due to his current chemo regime.  Absolute neutrophil count was 300 yesterday.  He is well-appearing here with normal O2 sats, blood pressure and pulse.  Initially tried to do a sepsis workup but patient refused and only wanted a CBC, viral panel and chest x-ray done.  He is consenting to urine at this time.  However did discuss with him if his absolute neutrophil count is still as low that he would need further testing.  11:16 PM I independently interpreted patient's labs and patient CBC still shows leukopenia with a white count of 1.4 and absolute neutrophil count of 400.  Did discuss this with the patient and he now consents for full sepsis workup.  Blood cultures were done, lactic acid and CMP were sent.  Viral panel is negative. Lactic acid and CMP are within normal limits.  I have independently visualized and interpreted pt's images today.  CXR without acute findings per my evaluation.  Paging oncology for recommendations. Pt checked out to Dr. Theadore        Final Clinical Impression(s) / ED Diagnoses Final diagnoses:  None    Rx / DC Orders ED Discharge Orders     None         Doretha Folks, MD 12/27/23 2337

## 2023-12-27 NOTE — ED Notes (Addendum)
 This RN told patient the importance of getting sepsis labs due to chief complaint of fever. Pt reports he only want a CBC, chest x-ray and covid swab.Pt refuse to have sepsis blood work done. This RN explain to the patient the importance of the bloodwork.

## 2023-12-27 NOTE — ED Notes (Addendum)
 Pt reports he is here today due to fevers at home today of 100.4. Pt reports he was told to come into the ER if he ever develops a fever. Pt reports he is a cancer patient. Pt reports last chemo treatment was last Thursday. Pt reports nasal congestion,cough,running nose.

## 2023-12-27 NOTE — ED Notes (Signed)
Pt refused to be placed on the monitor.

## 2023-12-28 MED ORDER — CEFDINIR 300 MG PO CAPS: 300.0000 mg | ORAL_CAPSULE | Freq: Two times a day (BID) | ORAL | 0 refills | Status: AC

## 2023-12-28 MED ORDER — SODIUM CHLORIDE 0.9 % IV SOLN
2.0000 g | Freq: Once | INTRAVENOUS | Status: AC
  Administered 2023-12-28: 2 g via INTRAVENOUS
  Filled 2023-12-28: qty 12.5

## 2023-12-28 NOTE — Discharge Instructions (Signed)
 You were evaluated in the Emergency Department and after careful evaluation, we did not find any emergent condition requiring admission or further testing in the hospital.  Your exam/testing today is overall reassuring.  We discussed management options and decided to give you a dose of IV antibiotics and have you closely follow-up with your oncology team.  Take the Omnicef  medication as directed.  Please return to the Emergency Department if you experience any worsening of your condition.   Thank you for allowing us  to be a part of your care.

## 2023-12-30 ENCOUNTER — Encounter: Payer: Self-pay | Admitting: Hematology

## 2024-01-02 LAB — CULTURE, BLOOD (ROUTINE X 2)
Special Requests: ADEQUATE
Special Requests: ADEQUATE

## 2024-01-02 NOTE — Progress Notes (Signed)
 HEMATOLOGY/ONCOLOGY CLINIC NOTE  Date of Service: 01/03/2024  Patient Care Team: Frankie Israel, MD as PCP - General (Hematology) Alfornia Anis, Reeda Canner, MD as Referring Physician (Neurology)  CHIEF COMPLAINTS/PURPOSE OF CONSULTATION:  F/u for evaluation and management of newly diagnosed multiple myeloma  HISTORY OF PRESENTING ILLNESS:   Colin Calderon is a wonderful 73 y.o. male who has been referred to us  by Annett Bart, MD for evaluation and management of abnormal protein.   He presented to the ED on 07/06/2023 for pneumonia of both lungs due to infectious organism.   Today, he is accompanied by his wife and friend. 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 His anemia is gradually worsening, previously hgb was11.2, 10 months ago and more recently 9.7 a week ago.   He reports that he was having some numbness in his hands and feet present for 2-4 weeks, which caused him to see a neurologist. As part of the neurologist's workup to see if his symptoms are related to paraproteinemia, there were findings of abnormal protein with M protein level of 0.2 g/dL. His wife reports that he has a nerve conduction study planned for December.   He denies any new fatigue, unexplained fever, chills, night sweats, or unexpected weight loss. He reports a 3-pound weight loss based on his scale at home.   He reports that he recently had pneumonia which was not unusual as he did have pneumonia previosuly. Patient notes that he did develop a fever causing him to present to the ED. Patient denies any SOB at this time, but does endorse sporadic coughing. He denies any issues with urinary infections or sinus infections.   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   He reports having a slightly enlarged prostate.   Patient reports having his thyroid  checked with [facility] recently, but can't find the results. He reports a fhx of thyroid  issues.   He denies any new leg  swelling, skin rashes, change in vision that is new or different, or headaches that are new or different. Patient denies any abdominal symptoms, such as abdominal pain or abdominal distention.    Patient denies having any chronic medical issues such as heart or lung issues.   He reports that he only takes B-12 replacement. Patient notes that he was previously B12 deficient in the past. He reports that the vitamin pills were not being absorbed.   Patient tends to be vegetarian for the most part, but does eats chicken occasionally.   He has no other medical issues.   He reports a surgical history of inguinal hernia surgery years ago most likely on the right side.   He reports an allergy to amoxicillin with rash reaction. He has not other medication allergies.   Patient does report a history of thyroid  disorders in the family.  He reports that his brother passed away from leukemia. Patient reports that his father did have coronary issues 19  Mother 44 years  He is a never smoker and denies any alcohol consumption or use of other recreational chemicals.   Previously, patient was generally very active through activities including hiking, walking, and swimming. However, over the last couple of months he has endorsed new fatigue alongside worsening anemia. His wife reports that he has sometimes felt too tired to drive, which is unusual. She also noticed that he lags behind when they go for walks together over the last six months.   He complains of poor  sleep mostly due to urinary frequency, which is not a new symptom. 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 Patient reports very sensitive arms and legs. Patient reports that his mother did have "horrible" neuropathy and is constantly in pain.   His wife reports that patient previously had a loud voice and has lost his voice recently, which patient attributes to pneumonia. He denies any chest discomfort  at this time.   He did have thyroid  function checked recently. Patient denies any obvious lumps/bumps.Patient denies any spine pain or abdominal pain.   INTERVAL HISTORY:  Colin Calderon is a wonderful 73 y.o. male who is here for continued evaluation and management of abnormal protein.  Patient was last seen by me on 12/18/2023 and reported stable carpal tunnel issues primarily in the left hand in the radial side near his thumb. He also was noted to endorse drowsiness issues and some emotional challenges with the involved treatment process.   He presented to the ED on 12/27/2023 for neutropenic fever witih URI symptoms. Urine culture and Blood culture testing was negative. Swab testing showed no main respiratory virus. Chest x-ray negative. Patient was given Omnicef  300 MG twice daily.   Today, he presents for toxicity check prior to receiving cycle 2 day 22 of treatment. Patient is accompanied by his wife during this visit. He denies any obvious infection.   He reports having a cold two weeks ago due to having contact with sick individuals. Along with fever, he endorsed a cough, congestion, and nasal drip. He continues to have mild congestion with nasal drip and mild cough at this time. He denies constant nasal discharge at this time. Patient notes that today is his last day on his antibiotics course. Patient reports that he has been off of Revlimid  since his infection.   He reports previous temperature reading of 100.4 causing him to present to the ER and get started on IV antibiotics. Patient denies having any more fevers at home since his hospitalization and notes that his temperature reading was 97 recently.   His carpal tunnel issues have been stable. Patient has improved symptoms in the right hand and unchanged symptoms in his left hand. Patient reports that his familial tremor has worsened.   Patient has lost 2 pounds since 12/18/2023 and his current weight is 168 pounds.   He is not  inclined to try nutritional supplements and would prefer to focus on optimizing his diet with optimal food intake.   Patient is needing refills for acyclovir  and steroids.   Patient reports upcoming appt with Dr. Bernetta Brilliant on 01/07/2024.   MEDICAL HISTORY:  No past medical history on file.  SURGICAL HISTORY: Past Surgical History:  Procedure Laterality Date   HERNIA REPAIR      SOCIAL HISTORY: Social History   Socioeconomic History   Marital status: Married    Spouse name: Not on file   Number of children: Not on file   Years of education: Not on file   Highest education level: Not on file  Occupational History   Not on file  Tobacco Use   Smoking status: Never   Smokeless tobacco: Not on file  Substance and Sexual Activity   Alcohol use: Yes   Drug use: No   Sexual activity: Not on file  Other Topics Concern   Not on file  Social History Narrative   Not on file   Social Drivers of Health   Financial  Resource Strain: Low Risk  (07/01/2023)   Overall Financial Resource Strain (CARDIA)    Difficulty of Paying Living Expenses: Not hard at all  Food Insecurity: Low Risk  (10/16/2023)   Received from [facility]   Hunger Vital Sign    Worried About Running Out of Food in the Last Year: Never true    Ran Out of Food in the Last Year: Never true  Transportation Needs: No Transportation Needs (10/16/2023)   Received from [facility]    In the past 12 months, has lack of reliable transportation kept you from medical appointments, meetings, work or from getting things needed for daily living? : No  Physical Activity: Sufficiently Active (07/01/2023)   Exercise Vital Sign    Days of Exercise per Week: 7 days    Minutes of Exercise per Session: 40 min  Stress: No Stress Concern Present (07/01/2023)   [employer] of Occupational Health - Occupational Stress Questionnaire    Feeling of Stress : Not at all  Social Connections: Socially Integrated  (07/01/2023)   Social Connection and Isolation Panel [NHANES]    Frequency of Communication with Friends and Family: Not on file    Frequency of Social Gatherings with Friends and Family: Three times a week    Attends Religious Services: More than 4 times per year    Active Member of Clubs or Organizations: Yes    Attends Banker Meetings: More than 4 times per year    Marital Status: Married  Catering manager Violence: Not At Risk (07/01/2023)   Humiliation, Afraid, Rape, and Kick questionnaire    Fear of Current or Ex-Partner: No    Emotionally Abused: No    Physically Abused: No    Sexually Abused: No    FAMILY HISTORY: No family history on file.  ALLERGIES:  is allergic to amoxicillin.  MEDICATIONS:  Current Outpatient Medications  Medication Sig Dispense Refill   acyclovir  (ZOVIRAX ) 400 MG tablet Take 1 tablet (400 mg total) by mouth 2 (two) times daily. 60 tablet 5   aspirin  EC 81 MG tablet Take 1 tablet (81 mg total) by mouth daily. 100 tablet 3   azithromycin  (ZITHROMAX ) 250 MG tablet Take 1 tablet (250 mg total) by mouth daily. Take first 2 tablets together, then 1 every day until finished. (Patient not taking: Reported on 11/07/2023) 6 tablet 0   benzonatate  (TESSALON ) 100 MG capsule Take 1 capsule (100 mg total) by mouth every 8 (eight) hours. (Patient not taking: Reported on 11/07/2023) 21 capsule 0   cefdinir  (OMNICEF ) 300 MG capsule Take 1 capsule (300 mg total) by mouth 2 (two) times daily for 7 days. 14 capsule 0   Cobalamin Combinations (B-12) [phone] MCG SUBL Take 1 tablet by mouth daily.     Cyanocobalamin  5000 MCG/ML LIQD 1 tablet 1 day or 1 dose.     dexamethasone  (DECADRON ) 4 MG tablet Take 5 tabs (20 mg) weekly the day after daratumumab  for 12 weeks. Take with breakfast. 20 tablet 5   lenalidomide  (REVLIMID ) 15 MG capsule Take 1 capsule (15 mg total) by mouth daily. Take for 21 days on, 7 days off, repeat every 21 days x 6. 21 capsule 0    ondansetron  (ZOFRAN ) 8 MG tablet Take 1 tablet (8 mg total) by mouth every 8 (eight) hours as needed for nausea or vomiting. 30 tablet 1   prochlorperazine  (COMPAZINE ) 10 MG tablet Take 1 tablet (10 mg total) by mouth every 6 (six) hours  as needed for nausea or vomiting. 30 tablet 1   No current facility-administered medications for this visit.    REVIEW OF SYSTEMS:    10 Point review of Systems was done is negative except as noted above.   PHYSICAL EXAMINATION: ECOG PERFORMANCE STATUS: 1 - Symptomatic but completely ambulatory  . Vitals:   01/03/24 1000  BP: 117/68  Pulse: 67  Resp: 17  Temp: 97.6 F (36.4 C)  SpO2: 100%   Filed Weights   01/03/24 1000  Weight: 168 lb (76.2 kg)  .Body mass index is 23.43 kg/m. GENERAL:alert, in no acute distress and comfortable SKIN: no acute rashes, no significant lesions EYES: conjunctiva are pink and non-injected, sclera anicteric OROPHARYNX: MMM, no exudates, no oropharyngeal erythema or ulceration NECK: supple, no JVD LYMPH:  no palpable lymphadenopathy in the cervical, axillary or inguinal regions LUNGS: clear to auscultation b/l with Colin respiratory effort HEART: regular rate & rhythm ABDOMEN:  normoactive bowel sounds , non tender, not distended. Extremity: no pedal edema PSYCH: alert & oriented x 3 with fluent speech NEURO: no focal motor/sensory deficits   LABORATORY DATA:  I have reviewed the data as listed .    Latest Ref Rng & Units 01/03/2024    9:44 AM 12/27/2023    8:46 PM 12/26/2023    1:33 PM  CBC  WBC 4.0 - 10.5 K/uL 2.1  1.4  1.2   Hemoglobin 13.0 - 17.0 g/dL 40.9  81.1  91.4   Hematocrit 39.0 - 52.0 % 31.8  29.8  31.2   Platelets 150 - 400 K/uL 340  184  142     .    Latest Ref Rng & Units 01/03/2024    9:44 AM 12/27/2023   10:22 PM 12/12/2023    1:50 PM  CMP  Glucose 70 - 99 mg/dL 782  956  213   BUN 8 - 23 mg/dL 20  21  19    Creatinine 0.61 - 1.24 mg/dL 0.86  5.78  4.69   Sodium 135 - 145 mmol/L  140  135  143   Potassium 3.5 - 5.1 mmol/L 4.6  4.3  4.2   Chloride 98 - 111 mmol/L 108  102  107   CO2 22 - 32 mmol/L 28  25  32   Calcium 8.9 - 10.3 mg/dL 8.8  8.4  8.7   Total Protein 6.5 - 8.1 g/dL 6.0  5.6  5.7   Total Bilirubin 0.0 - 1.2 mg/dL 0.4  0.5  0.4   Alkaline Phos 38 - 126 U/L 49  40  47   AST 15 - 41 U/L 11  8  11    ALT 0 - 44 U/L 11  8  14       07/17/2023 Surgical pathology:   07/17/2023 Surgical pathology:      RADIOGRAPHIC STUDIES: I have personally reviewed the radiological images as listed and agreed with the findings in the report. DG Chest 2 View Result Date: 12/27/2023 CLINICAL DATA:  Nasal congestion with runny nose and fever. EXAM: CHEST - 2 VIEW COMPARISON:  July 06, 2023 FINDINGS: The heart size and mediastinal contours are within Colin limits. Both lungs are clear. The visualized skeletal structures are unremarkable. IMPRESSION: No active cardiopulmonary disease. Electronically Signed   By: Virgle Grime M.D.   On: 12/27/2023 23:26    ASSESSMENT & PLAN:   73 y.o. 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 Recently diagnosed Active Multiple myeloma -Kappa light chain producing.  BM Bx showing a second population of lymphoplasmacytoid cells which are 90% of the lymphoplasmacytoid cells which are CD20+ve and neg for CD5 and CD10 and show Kappa light chain restriction. 3. 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  4. 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  -Discussed lab results on 01/03/2024 in detail with patient. CBC showed WBC of 2.1K, hemoglobin of 11.0, and platelets of 340K. -WBCs mildly improved from 1.4 to 2.1 -neutrophils are improving from 300 on 12/26/2023 up to 900 currently -discussed that Revlimid  can cause variable drops in neutrophils -will continue to monitor neutrophils and adjust Revlimid  accordingly -his other blood counts, including hgb and platelets, are improving  -discussed that there is a need to balance his Revlimid  so that it is as effective as possible without causing significant neutropenia  -will continue to monitor light chains which should hopefully continue to improve. Kappa free light chains noted to be as high as 700s and has decreased to 200s one month ago. -educated patient that studies have shown that using a higher dose of Revlimid  and taking some time off to allow the bone marrow to recover is more effective than lower dose Revlimid  without taking some time off.  -patient is appropriate to proceed with cycle 2 day 22 of treatment today -discussed that he will receive daratumumab  today, then in 1 week, then every 2 weeks with cycle 3 -continue to hold Revlimid  for an additional week. From next week, if neutrophils improve, we will restart revlimid  15 MG for 1 week on, 1 week off, at same dose. There may be a role to eventually switch Revlimid  to 2 weeks on, 1 week off, afterward if he is stable.  -will evaluate his response after 2 cycles of treatment -Patient is currently on Daratumumab , revlimid , dexamethasone  treatment regimen with goal of adequate response in 4-6 cycles. Discussed that if his response is not fast enough, there may be a role to potentially add carfilzomib to his treatment  regimen. Discussed that the addition of carfilzomib would be a more agggressive regimen though carfilzomib has less neuropathy concerns than velcade. No indication to add carfilzomib at this time unless necessary.  -worsened familial tremor could be due to steroids. Discussed that his familial tremor may improve as his daratumumab  is spaced out and he is given less steroids -will refill acyclovir   -will refill dexamethasone   -discussed option of growth factor injections if needed for low WBCs and if there is any sign of infection or fever -advised patient to continue to eat as well as possible and stay well hydrated  -recommend taking nutritional supplements between meals -patient is inclined to hold off on nutritional supplements and focus on optimizing food intake -advised patient to avoid consuming unpasteurized milk products due to risk of infections -avoid unpasteurized orange juice or tangerine juice -Proceed with upcoming appointment with Dr. Bernetta Brilliant on Tuesday, 01/07/2024 -he  shall discus with Dr. Bernetta Brilliant whether he may be inclined to either consider a bone marrow transplant down the line or directly switch to maintenance treatment at that time -answered all of patient's and his wife's questions in detail  FOLLOW-UP: Per integrated scheduling  The total time spent in the appointment was 32 minutes* .  All of the patient's questions were answered with apparent satisfaction. The patient knows to call the clinic with any problems, questions or concerns.   Jacquelyn Matt MD MS AAHIVMS [facility] Hematology/Oncology Physician [facility]  .*Total Encounter Time as defined by the Centers for Medicare and Medicaid Services includes, in addition to the face-to-face time of a patient visit (documented in the note above) non-face-to-face time: obtaining and reviewing outside history, ordering and reviewing medications, tests or procedures, care coordination (communications with other health  care professionals or caregivers) and documentation in the medical record.    I,Mitra Faeizi,acting as a Neurosurgeon for Jacquelyn Matt, MD.,have documented all relevant documentation on the behalf of Jacquelyn Matt, MD,as directed by  Jacquelyn Matt, MD while in the presence of Jacquelyn Matt, MD.  .I have reviewed the above documentation for accuracy and completeness, and I agree with the above. .Wildon Cuevas Kishore Cosme Jacob MD

## 2024-01-03 ENCOUNTER — Inpatient Hospital Stay

## 2024-01-03 ENCOUNTER — Encounter: Payer: Self-pay | Admitting: Hematology

## 2024-01-03 ENCOUNTER — Inpatient Hospital Stay: Admitting: Hematology

## 2024-01-03 VITALS — BP 117/68 | HR 67 | Temp 97.6°F | Resp 17 | Wt 168.0 lb

## 2024-01-03 DIAGNOSIS — Z5112 Encounter for antineoplastic immunotherapy: Secondary | ICD-10-CM | POA: Diagnosis not present

## 2024-01-03 DIAGNOSIS — Z5111 Encounter for antineoplastic chemotherapy: Secondary | ICD-10-CM

## 2024-01-03 DIAGNOSIS — C9000 Multiple myeloma not having achieved remission: Secondary | ICD-10-CM

## 2024-01-03 LAB — CMP (CANCER CENTER ONLY)
ALT: 11 U/L (ref 0–44)
AST: 11 U/L — ABNORMAL LOW (ref 15–41)
Albumin: 4 g/dL (ref 3.5–5.0)
Alkaline Phosphatase: 49 U/L (ref 38–126)
Anion gap: 4 — ABNORMAL LOW (ref 5–15)
BUN: 20 mg/dL (ref 8–23)
CO2: 28 mmol/L (ref 22–32)
Calcium: 8.8 mg/dL — ABNORMAL LOW (ref 8.9–10.3)
Chloride: 108 mmol/L (ref 98–111)
Creatinine: 0.95 mg/dL (ref 0.61–1.24)
GFR, Estimated: >60 mL/min (ref >60)
Glucose, Bld: 144 mg/dL — ABNORMAL HIGH (ref 70–99)
Potassium: 4.6 mmol/L (ref 3.5–5.1)
Sodium: 140 mmol/L (ref 135–145)
Total Bilirubin: 0.4 mg/dL (ref 0.0–1.2)
Total Protein: 6 g/dL — ABNORMAL LOW (ref 6.5–8.1)

## 2024-01-03 LAB — CBC WITH DIFFERENTIAL (CANCER CENTER ONLY)
Abs Immature Granulocytes: 0.03 10*3/uL (ref 0.00–0.07)
Basophils Absolute: 0.1 10*3/uL (ref 0.0–0.1)
Basophils Relative: 3 %
Eosinophils Absolute: 0.1 10*3/uL (ref 0.0–0.5)
Eosinophils Relative: 6 %
HCT: 31.8 % — ABNORMAL LOW (ref 39.0–52.0)
Hemoglobin: 11 g/dL — ABNORMAL LOW (ref 13.0–17.0)
Immature Granulocytes: 2 %
Lymphocytes Relative: 40 %
Lymphs Abs: 0.8 10*3/uL (ref 0.7–4.0)
MCH: 32.9 pg (ref 26.0–34.0)
MCHC: 34.6 g/dL (ref 30.0–36.0)
MCV: 95.2 fL (ref 80.0–100.0)
Monocytes Absolute: 0.2 10*3/uL (ref 0.1–1.0)
Monocytes Relative: 7 %
Neutro Abs: 0.9 10*3/uL — ABNORMAL LOW (ref 1.7–7.7)
Neutrophils Relative %: 42 %
Platelet Count: 340 10*3/uL (ref 150–400)
RBC: 3.34 MIL/uL — ABNORMAL LOW (ref 4.22–5.81)
RDW: 13.1 % (ref 11.5–15.5)
WBC Count: 2.1 10*3/uL — ABNORMAL LOW (ref 4.0–10.5)
nRBC: 0 % (ref 0.0–0.2)

## 2024-01-03 MED ORDER — DEXAMETHASONE 4 MG PO TABS
20.0000 mg | ORAL_TABLET | Freq: Once | ORAL | Status: AC
  Administered 2024-01-03: 20 mg via ORAL
  Filled 2024-01-03: qty 5

## 2024-01-03 MED ORDER — DIPHENHYDRAMINE HCL 25 MG PO CAPS
50.0000 mg | ORAL_CAPSULE | Freq: Once | ORAL | Status: AC
  Administered 2024-01-03: 50 mg via ORAL
  Filled 2024-01-03: qty 2

## 2024-01-03 MED ORDER — DARATUMUMAB-HYALURONIDASE-FIHJ 1800-30000 MG-UT/15ML ~~LOC~~ SOLN
1800.0000 mg | Freq: Once | SUBCUTANEOUS | Status: AC
  Administered 2024-01-03: 1800 mg via SUBCUTANEOUS
  Filled 2024-01-03: qty 15

## 2024-01-03 MED ORDER — ACETAMINOPHEN 325 MG PO TABS
650.0000 mg | ORAL_TABLET | Freq: Once | ORAL | Status: AC
Start: 2024-01-03 — End: 2024-01-03
  Administered 2024-01-03: 650 mg via ORAL
  Filled 2024-01-03: qty 2

## 2024-01-03 MED ORDER — MONTELUKAST SODIUM 10 MG PO TABS
10.0000 mg | ORAL_TABLET | Freq: Once | ORAL | Status: AC
  Administered 2024-01-03: 10 mg via ORAL
  Filled 2024-01-03: qty 1

## 2024-01-03 NOTE — Progress Notes (Signed)
 Patient seen by Dr. Addison Naegeli are within treatment parameters.  Labs reviewed: and are not all within treatment parameters.   Dr Candise Che aware ANC: 0.9,   Per physician team, patient is ready for treatment and there are NO modifications to the treatment plan.

## 2024-01-06 LAB — KAPPA/LAMBDA LIGHT CHAINS
Kappa free light chain: 106.4 mg/L — ABNORMAL HIGH (ref 3.3–19.4)
Kappa, lambda light chain ratio: 42.56 — ABNORMAL HIGH (ref 0.26–1.65)
Lambda free light chains: 2.5 mg/L — ABNORMAL LOW (ref 5.7–26.3)

## 2024-01-06 LAB — MULTIPLE MYELOMA PANEL, SERUM
Albumin SerPl Elph-Mcnc: 3.2 g/dL (ref 2.9–4.4)
Albumin/Glob SerPl: 1.4 (ref 0.7–1.7)
Alpha 1: 0.3 g/dL (ref 0.0–0.4)
Alpha2 Glob SerPl Elph-Mcnc: 0.9 g/dL (ref 0.4–1.0)
B-Globulin SerPl Elph-Mcnc: 0.7 g/dL (ref 0.7–1.3)
Gamma Glob SerPl Elph-Mcnc: 0.3 g/dL — ABNORMAL LOW (ref 0.4–1.8)
Globulin, Total: 2.3 g/dL (ref 2.2–3.9)
IgA: 9 mg/dL — ABNORMAL LOW (ref 61–437)
IgG (Immunoglobin G), Serum: 354 mg/dL — ABNORMAL LOW (ref 603–1613)
IgM (Immunoglobulin M), Srm: <5 mg/dL — ABNORMAL LOW (ref 15–143)
M Protein SerPl Elph-Mcnc: 0.1 g/dL — ABNORMAL HIGH
Total Protein ELP: 5.5 g/dL — ABNORMAL LOW (ref 6.0–8.5)

## 2024-01-07 DIAGNOSIS — C9000 Multiple myeloma not having achieved remission: Secondary | ICD-10-CM | POA: Diagnosis not present

## 2024-01-07 DIAGNOSIS — D472 Monoclonal gammopathy: Secondary | ICD-10-CM | POA: Diagnosis not present

## 2024-01-08 ENCOUNTER — Telehealth: Payer: Self-pay | Admitting: Hematology

## 2024-01-08 NOTE — Telephone Encounter (Signed)
 Left patient a vm regarding upcoming appointment

## 2024-01-09 ENCOUNTER — Other Ambulatory Visit: Payer: Self-pay

## 2024-01-09 ENCOUNTER — Encounter: Payer: Self-pay | Admitting: Hematology

## 2024-01-10 ENCOUNTER — Other Ambulatory Visit: Payer: Self-pay

## 2024-01-10 DIAGNOSIS — Z6823 Body mass index (BMI) 23.0-23.9, adult: Secondary | ICD-10-CM | POA: Diagnosis not present

## 2024-01-10 DIAGNOSIS — K1379 Other lesions of oral mucosa: Secondary | ICD-10-CM | POA: Diagnosis not present

## 2024-01-10 DIAGNOSIS — C9000 Multiple myeloma not having achieved remission: Secondary | ICD-10-CM

## 2024-01-10 DIAGNOSIS — R1030 Lower abdominal pain, unspecified: Secondary | ICD-10-CM | POA: Diagnosis not present

## 2024-01-13 ENCOUNTER — Inpatient Hospital Stay: Attending: Oncology

## 2024-01-13 ENCOUNTER — Inpatient Hospital Stay

## 2024-01-13 VITALS — BP 110/66 | HR 62 | Temp 98.4°F | Resp 16

## 2024-01-13 DIAGNOSIS — Z79899 Other long term (current) drug therapy: Secondary | ICD-10-CM | POA: Diagnosis not present

## 2024-01-13 DIAGNOSIS — C9000 Multiple myeloma not having achieved remission: Secondary | ICD-10-CM

## 2024-01-13 DIAGNOSIS — Z5112 Encounter for antineoplastic immunotherapy: Secondary | ICD-10-CM | POA: Diagnosis not present

## 2024-01-13 LAB — CBC WITH DIFFERENTIAL (CANCER CENTER ONLY)
Abs Immature Granulocytes: 0 10*3/uL (ref 0.00–0.07)
Basophils Absolute: 0.1 10*3/uL (ref 0.0–0.1)
Basophils Relative: 1 %
Eosinophils Absolute: 0.1 10*3/uL (ref 0.0–0.5)
Eosinophils Relative: 2 %
HCT: 30.8 % — ABNORMAL LOW (ref 39.0–52.0)
Hemoglobin: 10.5 g/dL — ABNORMAL LOW (ref 13.0–17.0)
Immature Granulocytes: 0 %
Lymphocytes Relative: 33 %
Lymphs Abs: 1.6 10*3/uL (ref 0.7–4.0)
MCH: 32.9 pg (ref 26.0–34.0)
MCHC: 34.1 g/dL (ref 30.0–36.0)
MCV: 96.6 fL (ref 80.0–100.0)
Monocytes Absolute: 0.3 10*3/uL (ref 0.1–1.0)
Monocytes Relative: 7 %
Neutro Abs: 2.7 10*3/uL (ref 1.7–7.7)
Neutrophils Relative %: 57 %
Platelet Count: 287 10*3/uL (ref 150–400)
RBC: 3.19 MIL/uL — ABNORMAL LOW (ref 4.22–5.81)
RDW: 14 % (ref 11.5–15.5)
WBC Count: 4.7 10*3/uL (ref 4.0–10.5)
nRBC: 0 % (ref 0.0–0.2)

## 2024-01-13 LAB — CMP (CANCER CENTER ONLY)
ALT: 10 U/L (ref 0–44)
AST: 11 U/L — ABNORMAL LOW (ref 15–41)
Albumin: 3.7 g/dL (ref 3.5–5.0)
Alkaline Phosphatase: 44 U/L (ref 38–126)
Anion gap: 3 — ABNORMAL LOW (ref 5–15)
BUN: 27 mg/dL — ABNORMAL HIGH (ref 8–23)
CO2: 27 mmol/L (ref 22–32)
Calcium: 8.5 mg/dL — ABNORMAL LOW (ref 8.9–10.3)
Chloride: 110 mmol/L (ref 98–111)
Creatinine: 0.91 mg/dL (ref 0.61–1.24)
GFR, Estimated: >60 mL/min (ref >60)
Glucose, Bld: 102 mg/dL — ABNORMAL HIGH (ref 70–99)
Potassium: 4.3 mmol/L (ref 3.5–5.1)
Sodium: 140 mmol/L (ref 135–145)
Total Bilirubin: 0.3 mg/dL (ref 0.0–1.2)
Total Protein: 5.5 g/dL — ABNORMAL LOW (ref 6.5–8.1)

## 2024-01-13 MED ORDER — DIPHENHYDRAMINE HCL 25 MG PO CAPS
50.0000 mg | ORAL_CAPSULE | Freq: Once | ORAL | Status: AC
  Administered 2024-01-13: 50 mg via ORAL
  Filled 2024-01-13: qty 2

## 2024-01-13 MED ORDER — DEXAMETHASONE 4 MG PO TABS
20.0000 mg | ORAL_TABLET | Freq: Once | ORAL | Status: AC
  Administered 2024-01-13: 20 mg via ORAL
  Filled 2024-01-13: qty 5

## 2024-01-13 MED ORDER — DARATUMUMAB-HYALURONIDASE-FIHJ 1800-30000 MG-UT/15ML ~~LOC~~ SOLN
1800.0000 mg | Freq: Once | SUBCUTANEOUS | Status: AC
  Administered 2024-01-13: 1800 mg via SUBCUTANEOUS
  Filled 2024-01-13: qty 15

## 2024-01-13 MED ORDER — MONTELUKAST SODIUM 10 MG PO TABS
10.0000 mg | ORAL_TABLET | Freq: Once | ORAL | Status: AC
  Administered 2024-01-13: 10 mg via ORAL
  Filled 2024-01-13: qty 1

## 2024-01-13 MED ORDER — ACETAMINOPHEN 325 MG PO TABS
650.0000 mg | ORAL_TABLET | Freq: Once | ORAL | Status: AC
  Administered 2024-01-13: 650 mg via ORAL
  Filled 2024-01-13: qty 2

## 2024-01-13 NOTE — Patient Instructions (Addendum)

## 2024-01-14 ENCOUNTER — Other Ambulatory Visit: Payer: Self-pay | Admitting: Hematology

## 2024-01-14 ENCOUNTER — Encounter: Payer: Self-pay | Admitting: Hematology

## 2024-01-14 DIAGNOSIS — C9000 Multiple myeloma not having achieved remission: Secondary | ICD-10-CM

## 2024-01-17 ENCOUNTER — Other Ambulatory Visit

## 2024-01-17 ENCOUNTER — Ambulatory Visit: Admitting: Hematology

## 2024-01-17 ENCOUNTER — Ambulatory Visit

## 2024-01-23 ENCOUNTER — Other Ambulatory Visit: Payer: Self-pay

## 2024-01-26 NOTE — Progress Notes (Signed)
 HEMATOLOGY/ONCOLOGY CLINIC NOTE  Date of Service: 01/27/2024  Patient Care Team: Frankie Israel, MD as PCP - General (Hematology) Alfornia Anis, Reeda Canner, MD as Referring Physician (Neurology)  CHIEF COMPLAINTS/PURPOSE OF CONSULTATION:  F/u for evaluation and management of recently diagnosed multiple myeloma  HISTORY OF PRESENTING ILLNESS:   GENTLE HOGE is a wonderful 73 y.o. male who has been referred to us  by Annett Bart, MD for evaluation and management of abnormal protein.   He presented to the ED on 07/06/2023 for pneumonia of both lungs due to infectious organism.   Today, he is accompanied by his wife and friend. 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 His anemia is gradually worsening, previously hgb was11.2, 10 months ago and more recently 9.7 a week ago.   He reports that he was having some numbness in his hands and feet present for 2-4 weeks, which caused him to see a neurologist. As part of the neurologist's workup to see if his symptoms are related to paraproteinemia, there were findings of abnormal protein with M protein level of 0.2 g/dL. His wife reports that he has a nerve conduction study planned for December.   He denies any new fatigue, unexplained fever, chills, night sweats, or unexpected weight loss. He reports a 3-pound weight loss based on his scale at home.   He reports that he recently had pneumonia which was not unusual as he did have pneumonia previosuly. Patient notes that he did develop a fever causing him to present to the ED. Patient denies any SOB at this time, but does endorse sporadic coughing. He denies any issues with urinary infections or sinus infections.   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   He reports having a slightly enlarged prostate.   Patient reports having his thyroid  checked with [facility] recently, but can't find the results. He reports a fhx of thyroid  issues.   He denies any new leg  swelling, skin rashes, change in vision that is new or different, or headaches that are new or different. Patient denies any abdominal symptoms, such as abdominal pain or abdominal distention.    Patient denies having any chronic medical issues such as heart or lung issues.   He reports that he only takes B-12 replacement. Patient notes that he was previously B12 deficient in the past. He reports that the vitamin pills were not being absorbed.   Patient tends to be vegetarian for the most part, but does eats chicken occasionally.   He has no other medical issues.   He reports a surgical history of inguinal hernia surgery years ago most likely on the right side.   He reports an allergy to amoxicillin with rash reaction. He has not other medication allergies.   Patient does report a history of thyroid  disorders in the family.  He reports that his brother passed away from leukemia. Patient reports that his father did have coronary issues 21  Mother 7 years  He is a never smoker and denies any alcohol consumption or use of other recreational chemicals.   Previously, patient was generally very active through activities including hiking, walking, and swimming. However, over the last couple of months he has endorsed new fatigue alongside worsening anemia. His wife reports that he has sometimes felt too tired to drive, which is unusual. She also noticed that he lags behind when they go for walks together over the last six months.   He complains of poor  sleep mostly due to urinary frequency, which is not a new symptom. 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 Patient reports very sensitive arms and legs. Patient reports that his mother did have "horrible" neuropathy and is constantly in pain.   His wife reports that patient previously had a loud voice and has lost his voice recently, which patient attributes to pneumonia. He denies any chest discomfort  at this time.   He did have thyroid  function checked recently. Patient denies any obvious lumps/bumps.Patient denies any spine pain or abdominal pain.   INTERVAL HISTORY:  KALIL WOESSNER is a wonderful 73 y.o. male who is here for continued evaluation and management of abnormal protein.  Patient was last seen by me on 01/03/2024 and reported a cold infection with previous fever, mild cough, mild congestion, and nasal drip. He also reported stable/improved carpal tunnel symptoms, worsened familial tremor, and 2-pound weight-loss.  He was seen by Dr. Bernetta Brilliant on 01/07/2024 and had a discussion about possible bone marrow transplantation.  Patient and his wife note that the process seemed a little overwhelming and he has not decided about this.  Patient notes no acute new symptoms since his last clinic visit.  Has been tolerating the week on week of Revlimid  better.  No acute new treatment toxicities from daratumumab  Revlimid  or dexamethasone .  No new infection issues.  He notes that he has been taking more infection prevention precautions including staying away from his grandkids.  Labs done today were reviewed in detail.Aaron Aas  MEDICAL HISTORY:  No past medical history on file.  SURGICAL HISTORY: Past Surgical History:  Procedure Laterality Date   HERNIA REPAIR      SOCIAL HISTORY: Social History   Socioeconomic History   Marital status: Married    Spouse name: Not on file   Number of children: Not on file   Years of education: Not on file   Highest education level: Not on file  Occupational History   Not on file  Tobacco Use   Smoking status: Never   Smokeless tobacco: Not on file  Substance and Sexual Activity   Alcohol use: Yes   Drug use: No   Sexual activity: Not on file  Other Topics Concern   Not on file  Social History Narrative   Not on file   Social Drivers of Health   Financial Resource Strain: Low Risk  (07/01/2023)   Overall Financial Resource Strain (CARDIA)     Difficulty of Paying Living Expenses: Not hard at all  Food Insecurity: Low Risk  (10/16/2023)   Received from [facility]   Hunger Vital Sign    Worried About Running Out of Food in the Last Year: Never true    Ran Out of Food in the Last Year: Never true  Transportation Needs: No Transportation Needs (10/16/2023)   Received from [facility]    In the past 12 months, has lack of reliable transportation kept you from medical appointments, meetings, work or from getting things needed for daily living? : No  Physical Activity: Sufficiently Active (07/01/2023)   Exercise Vital Sign    Days of Exercise per Week: 7 days    Minutes of Exercise per Session: 40 min  Stress: No Stress Concern Present (07/01/2023)   [employer] of Occupational Health - Occupational Stress Questionnaire    Feeling of Stress : Not at all  Social Connections: Socially Integrated (07/01/2023)   Social Connection  and Isolation Panel [NHANES]    Frequency of Communication with Friends and Family: Not on file    Frequency of Social Gatherings with Friends and Family: Three times a week    Attends Religious Services: More than 4 times per year    Active Member of Clubs or Organizations: Yes    Attends Banker Meetings: More than 4 times per year    Marital Status: Married  Catering manager Violence: Not At Risk (07/01/2023)   Humiliation, Afraid, Rape, and Kick questionnaire    Fear of Current or Ex-Partner: No    Emotionally Abused: No    Physically Abused: No    Sexually Abused: No    FAMILY HISTORY: No family history on file.  ALLERGIES:  is allergic to amoxicillin.  MEDICATIONS:  Current Outpatient Medications  Medication Sig Dispense Refill   acyclovir  (ZOVIRAX ) 400 MG tablet Take 1 tablet (400 mg total) by mouth 2 (two) times daily. 60 tablet 5   aspirin  EC 81 MG tablet Take 1 tablet (81 mg total) by mouth daily. 100 tablet 3   azithromycin  (ZITHROMAX ) 250 MG tablet  Take 1 tablet (250 mg total) by mouth daily. Take first 2 tablets together, then 1 every day until finished. (Patient not taking: Reported on 11/07/2023) 6 tablet 0   benzonatate  (TESSALON ) 100 MG capsule Take 1 capsule (100 mg total) by mouth every 8 (eight) hours. (Patient not taking: Reported on 11/07/2023) 21 capsule 0   Cobalamin Combinations (B-12) [phone] MCG SUBL Take 1 tablet by mouth daily.     Cyanocobalamin  5000 MCG/ML LIQD 1 tablet 1 day or 1 dose.     dexamethasone  (DECADRON ) 4 MG tablet Take 5 tabs (20 mg) weekly the day after daratumumab  for 12 weeks. Take with breakfast. 20 tablet 5   lenalidomide  (REVLIMID ) 15 MG capsule Take 1 capsule (15 mg total) by mouth daily. Take for 21 days on, 7 days off, repeat every 21 days x 6. 21 capsule 0   ondansetron  (ZOFRAN ) 8 MG tablet Take 1 tablet (8 mg total) by mouth every 8 (eight) hours as needed for nausea or vomiting. 30 tablet 1   prochlorperazine  (COMPAZINE ) 10 MG tablet Take 1 tablet (10 mg total) by mouth every 6 (six) hours as needed for nausea or vomiting. 30 tablet 1   No current facility-administered medications for this visit.    REVIEW OF SYSTEMS:    10 Point review of Systems was done is negative except as noted above.   PHYSICAL EXAMINATION: ECOG PERFORMANCE STATUS: 1 - Symptomatic but completely ambulatory   Vitals:   01/27/24 1050  BP: 122/68  Pulse: 67  Resp: 20  Temp: 97.7 F (36.5 C)  SpO2: 100%    Filed Weights   01/27/24 1050  Weight: 171 lb 3.2 oz (77.7 kg)   .Body mass index is 23.88 kg/m.   GENERAL:alert, in no acute distress and comfortable SKIN: no acute rashes, no significant lesions EYES: conjunctiva are pink and non-injected, sclera anicteric OROPHARYNX: MMM, no exudates, no oropharyngeal erythema or ulceration NECK: supple, no JVD LYMPH:  no palpable lymphadenopathy in the cervical, axillary or inguinal regions LUNGS: clear to auscultation b/l with normal respiratory effort HEART:  regular rate & rhythm ABDOMEN:  normoactive bowel sounds , non tender, not distended. Extremity: no pedal edema PSYCH: alert & oriented x 3 with fluent speech NEURO: no focal motor/sensory deficits   LABORATORY DATA:  I have reviewed the data as listed .    Latest  Ref Rng & Units 01/27/2024    9:55 AM 01/13/2024    2:02 PM 01/03/2024    9:44 AM  CBC  WBC 4.0 - 10.5 K/uL 4.5  4.7  2.1   Hemoglobin 13.0 - 17.0 g/dL 45.4  09.8  11.9   Hematocrit 39.0 - 52.0 % 34.8  30.8  31.8   Platelets 150 - 400 K/uL 249  287  340     .    Latest Ref Rng & Units 01/13/2024    2:02 PM 01/03/2024    9:44 AM 12/27/2023   10:22 PM  CMP  Glucose 70 - 99 mg/dL 147  829  562   BUN 8 - 23 mg/dL 27  20  21    Creatinine 0.61 - 1.24 mg/dL 1.30  8.65  7.84   Sodium 135 - 145 mmol/L 140  140  135   Potassium 3.5 - 5.1 mmol/L 4.3  4.6  4.3   Chloride 98 - 111 mmol/L 110  108  102   CO2 22 - 32 mmol/L 27  28  25    Calcium 8.9 - 10.3 mg/dL 8.5  8.8  8.4   Total Protein 6.5 - 8.1 g/dL 5.5  6.0  5.6   Total Bilirubin 0.0 - 1.2 mg/dL 0.3  0.4  0.5   Alkaline Phos 38 - 126 U/L 44  49  40   AST 15 - 41 U/L 11  11  8    ALT 0 - 44 U/L 10  11  8       07/17/2023 Surgical pathology:   07/17/2023 Surgical pathology:      RADIOGRAPHIC STUDIES: I have personally reviewed the radiological images as listed and agreed with the findings in the report. DG Chest 2 View Result Date: 12/27/2023 CLINICAL DATA:  Nasal congestion with runny nose and fever. EXAM: CHEST - 2 VIEW COMPARISON:  July 06, 2023 FINDINGS: The heart size and mediastinal contours are within normal limits. Both lungs are clear. The visualized skeletal structures are unremarkable. IMPRESSION: No active cardiopulmonary disease. Electronically Signed   By: Virgle Grime M.D.   On: 12/27/2023 23:26    ASSESSMENT & PLAN:   73 y.o. 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 Recently diagnosed Active Multiple myeloma -Kappa light chain producing.  BM Bx showing a second population of lymphoplasmacytoid cells which are 90% of the lymphoplasmacytoid cells which are CD20+ve and neg for CD5 and CD10 and show Kappa light chain restriction. 3. 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  4. 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  CBC shows continued improvement of his hemoglobin up to 11.9 with normal WBC count and platelets Myeloma labs labs are on 01/03/2024 show continued response to treatment - Starting his Revlimid  1 week on 1 week off better panel WBC counts are normal. - Noted new toxicities from his dexamethasone  Revlimid  daratumumab  regimen. - No new infection issues.  Has been taking better infection precautions - Will continue Dara/Revlimid /dexamethasone  per integrated scheduling. - Continue same supportive medications - Continue aspirin  81 mg p.o. 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  All of the patient's questions were answered with apparent satisfaction. The patient knows to call the clinic with any problems, questions or concerns.   Jacquelyn Matt MD MS AAHIVMS [facility] Hematology/Oncology Physician [facility]  .*Total Encounter Time as defined by the Centers for Medicare and Medicaid Services includes, in addition to the face-to-face time of a patient visit (documented in the note above) non-face-to-face time: obtaining and reviewing outside history, ordering  and reviewing medications, tests or procedures, care coordination (communications with other health care professionals or caregivers) and documentation in the medical record.    I,Mitra Faeizi,acting as a Neurosurgeon for Jacquelyn Matt, MD.,have documented all relevant documentation on the behalf of Jacquelyn Matt, MD,as directed by  Jacquelyn Matt, MD while in the presence of Jacquelyn Matt, MD.  .I have reviewed the above documentation for accuracy and completeness, and I agree with the above. .Samir Ishaq Kishore Glanda Spanbauer MD

## 2024-01-27 ENCOUNTER — Inpatient Hospital Stay (HOSPITAL_BASED_OUTPATIENT_CLINIC_OR_DEPARTMENT_OTHER): Admitting: Hematology

## 2024-01-27 ENCOUNTER — Encounter: Payer: Self-pay | Admitting: Hematology

## 2024-01-27 ENCOUNTER — Inpatient Hospital Stay

## 2024-01-27 VITALS — BP 122/68 | HR 67 | Temp 97.7°F | Resp 20 | Wt 171.2 lb

## 2024-01-27 DIAGNOSIS — C9000 Multiple myeloma not having achieved remission: Secondary | ICD-10-CM | POA: Diagnosis not present

## 2024-01-27 DIAGNOSIS — Z5112 Encounter for antineoplastic immunotherapy: Secondary | ICD-10-CM | POA: Diagnosis not present

## 2024-01-27 DIAGNOSIS — Z5111 Encounter for antineoplastic chemotherapy: Secondary | ICD-10-CM

## 2024-01-27 LAB — CBC WITH DIFFERENTIAL (CANCER CENTER ONLY)
Abs Immature Granulocytes: 0.01 10*3/uL (ref 0.00–0.07)
Basophils Absolute: 0 10*3/uL (ref 0.0–0.1)
Basophils Relative: 1 %
Eosinophils Absolute: 0.2 10*3/uL (ref 0.0–0.5)
Eosinophils Relative: 4 %
HCT: 34.8 % — ABNORMAL LOW (ref 39.0–52.0)
Hemoglobin: 11.9 g/dL — ABNORMAL LOW (ref 13.0–17.0)
Immature Granulocytes: 0 %
Lymphocytes Relative: 25 %
Lymphs Abs: 1.1 10*3/uL (ref 0.7–4.0)
MCH: 32.9 pg (ref 26.0–34.0)
MCHC: 34.2 g/dL (ref 30.0–36.0)
MCV: 96.1 fL (ref 80.0–100.0)
Monocytes Absolute: 0.4 10*3/uL (ref 0.1–1.0)
Monocytes Relative: 8 %
Neutro Abs: 2.8 10*3/uL (ref 1.7–7.7)
Neutrophils Relative %: 62 %
Platelet Count: 249 10*3/uL (ref 150–400)
RBC: 3.62 MIL/uL — ABNORMAL LOW (ref 4.22–5.81)
RDW: 14.5 % (ref 11.5–15.5)
WBC Count: 4.5 10*3/uL (ref 4.0–10.5)
nRBC: 0 % (ref 0.0–0.2)

## 2024-01-27 MED ORDER — DIPHENHYDRAMINE HCL 25 MG PO CAPS
50.0000 mg | ORAL_CAPSULE | Freq: Once | ORAL | Status: AC
  Administered 2024-01-27: 50 mg via ORAL
  Filled 2024-01-27: qty 2

## 2024-01-27 MED ORDER — DARATUMUMAB-HYALURONIDASE-FIHJ 1800-30000 MG-UT/15ML ~~LOC~~ SOLN
1800.0000 mg | Freq: Once | SUBCUTANEOUS | Status: AC
  Administered 2024-01-27: 1800 mg via SUBCUTANEOUS
  Filled 2024-01-27: qty 15

## 2024-01-27 MED ORDER — ACETAMINOPHEN 325 MG PO TABS
650.0000 mg | ORAL_TABLET | Freq: Once | ORAL | Status: AC
  Administered 2024-01-27: 650 mg via ORAL
  Filled 2024-01-27: qty 2

## 2024-01-27 MED ORDER — DEXAMETHASONE 4 MG PO TABS
20.0000 mg | ORAL_TABLET | Freq: Once | ORAL | Status: AC
  Administered 2024-01-27: 20 mg via ORAL
  Filled 2024-01-27: qty 5

## 2024-01-27 NOTE — Progress Notes (Signed)
 Patient seen by Dr. Addison Naegeli are within treatment parameters.  Labs reviewed: and are within treatment parameters.  Per physician team, patient is ready for treatment and there are NO modifications to the treatment plan.

## 2024-01-27 NOTE — Patient Instructions (Signed)

## 2024-01-28 ENCOUNTER — Other Ambulatory Visit: Payer: Self-pay | Admitting: Hematology

## 2024-01-28 DIAGNOSIS — C9000 Multiple myeloma not having achieved remission: Secondary | ICD-10-CM

## 2024-01-29 ENCOUNTER — Ambulatory Visit

## 2024-01-29 ENCOUNTER — Ambulatory Visit: Admitting: Hematology

## 2024-01-29 ENCOUNTER — Other Ambulatory Visit

## 2024-01-29 ENCOUNTER — Encounter: Payer: Self-pay | Admitting: Hematology

## 2024-01-31 ENCOUNTER — Ambulatory Visit

## 2024-01-31 ENCOUNTER — Other Ambulatory Visit

## 2024-01-31 ENCOUNTER — Ambulatory Visit: Admitting: Hematology

## 2024-02-02 ENCOUNTER — Other Ambulatory Visit: Payer: Self-pay

## 2024-02-03 ENCOUNTER — Encounter: Payer: Self-pay | Admitting: Hematology

## 2024-02-04 ENCOUNTER — Encounter: Payer: Self-pay | Admitting: Hematology

## 2024-02-05 ENCOUNTER — Encounter: Payer: Self-pay | Admitting: Hematology

## 2024-02-06 ENCOUNTER — Telehealth: Payer: Self-pay

## 2024-02-06 NOTE — Telephone Encounter (Signed)
 TC from McKayla at [facility], requesting clarification of Revlimid  order d/t discrepancy between the order received yesterday and the pt's report of how he has been taking the medication. She states pt told her he takes Revlimid  7 days on, then 7 days off, but order states that he takes it 21 days on, then 7 off. TC to pt to discuss this, and he verifies that he has been taking it 7 days on, then 7 days off; last dose was on 5/25. Informed of the orders, and pt expresses concern that he has been taking medication incorrectly. Informed that the orders for Revlimid  have consistently been for 21 days on and 7 off, but will consult Dr. Salomon Cree since pt states he has "notes" which say he is to be taking it the way he has been. Informed that Dr. Salomon Cree is out of the office today but will return tomorrow and that someone from the office should be in touch with pt and [facility] to clarify this issue. McKayla at [facility] states that she needs clarification of this by 6/2 at the latest in order to make sure he gets the next shipment of Revlimid  on time. Her phone number is [phone], extension [phone].

## 2024-02-06 NOTE — Telephone Encounter (Signed)
 TC from pt, stating he still hasn't gotten a referral to general surgery to evaluate his R inguinal hernia. He states Dr. Salomon Cree told him he would make this referral at his last OV. Advised pt that Dr. Salomon Cree is out of the office today but will return tomorrow and will be notified of this request. Pt verbalizes understanding.

## 2024-02-10 ENCOUNTER — Inpatient Hospital Stay: Attending: Oncology

## 2024-02-10 ENCOUNTER — Inpatient Hospital Stay

## 2024-02-10 VITALS — BP 118/74 | HR 55 | Temp 97.9°F

## 2024-02-10 DIAGNOSIS — C9000 Multiple myeloma not having achieved remission: Secondary | ICD-10-CM

## 2024-02-10 DIAGNOSIS — Z79899 Other long term (current) drug therapy: Secondary | ICD-10-CM | POA: Insufficient documentation

## 2024-02-10 DIAGNOSIS — Z5112 Encounter for antineoplastic immunotherapy: Secondary | ICD-10-CM | POA: Diagnosis not present

## 2024-02-10 LAB — CBC WITH DIFFERENTIAL (CANCER CENTER ONLY)
Abs Immature Granulocytes: 0.01 10*3/uL (ref 0.00–0.07)
Basophils Absolute: 0.1 10*3/uL (ref 0.0–0.1)
Basophils Relative: 2 %
Eosinophils Absolute: 0.1 10*3/uL (ref 0.0–0.5)
Eosinophils Relative: 3 %
HCT: 34.5 % — ABNORMAL LOW (ref 39.0–52.0)
Hemoglobin: 11.8 g/dL — ABNORMAL LOW (ref 13.0–17.0)
Immature Granulocytes: 0 %
Lymphocytes Relative: 28 %
Lymphs Abs: 1.4 10*3/uL (ref 0.7–4.0)
MCH: 32.8 pg (ref 26.0–34.0)
MCHC: 34.2 g/dL (ref 30.0–36.0)
MCV: 95.8 fL (ref 80.0–100.0)
Monocytes Absolute: 0.6 10*3/uL (ref 0.1–1.0)
Monocytes Relative: 11 %
Neutro Abs: 2.9 10*3/uL (ref 1.7–7.7)
Neutrophils Relative %: 56 %
Platelet Count: 244 10*3/uL (ref 150–400)
RBC: 3.6 MIL/uL — ABNORMAL LOW (ref 4.22–5.81)
RDW: 14.2 % (ref 11.5–15.5)
WBC Count: 5.1 10*3/uL (ref 4.0–10.5)
nRBC: 0 % (ref 0.0–0.2)

## 2024-02-10 LAB — CMP (CANCER CENTER ONLY)
ALT: 13 U/L (ref 0–44)
AST: 11 U/L — ABNORMAL LOW (ref 15–41)
Albumin: 4.2 g/dL (ref 3.5–5.0)
Alkaline Phosphatase: 44 U/L (ref 38–126)
Anion gap: 3 — ABNORMAL LOW (ref 5–15)
BUN: 19 mg/dL (ref 8–23)
CO2: 30 mmol/L (ref 22–32)
Calcium: 9.1 mg/dL (ref 8.9–10.3)
Chloride: 107 mmol/L (ref 98–111)
Creatinine: 0.92 mg/dL (ref 0.61–1.24)
GFR, Estimated: >60 mL/min (ref >60)
Glucose, Bld: 122 mg/dL — ABNORMAL HIGH (ref 70–99)
Potassium: 4.7 mmol/L (ref 3.5–5.1)
Sodium: 140 mmol/L (ref 135–145)
Total Bilirubin: 0.4 mg/dL (ref 0.0–1.2)
Total Protein: 5.9 g/dL — ABNORMAL LOW (ref 6.5–8.1)

## 2024-02-10 MED ORDER — DIPHENHYDRAMINE HCL 25 MG PO CAPS
50.0000 mg | ORAL_CAPSULE | Freq: Once | ORAL | Status: AC
  Administered 2024-02-10: 50 mg via ORAL
  Filled 2024-02-10: qty 2

## 2024-02-10 MED ORDER — ACETAMINOPHEN 325 MG PO TABS
650.0000 mg | ORAL_TABLET | Freq: Once | ORAL | Status: AC
  Administered 2024-02-10: 650 mg via ORAL
  Filled 2024-02-10: qty 2

## 2024-02-10 MED ORDER — DARATUMUMAB-HYALURONIDASE-FIHJ 1800-30000 MG-UT/15ML ~~LOC~~ SOLN
1800.0000 mg | Freq: Once | SUBCUTANEOUS | Status: AC
  Administered 2024-02-10: 1800 mg via SUBCUTANEOUS
  Filled 2024-02-10: qty 15

## 2024-02-10 MED ORDER — DEXAMETHASONE 4 MG PO TABS
20.0000 mg | ORAL_TABLET | Freq: Once | ORAL | Status: AC
  Administered 2024-02-10: 20 mg via ORAL
  Filled 2024-02-10: qty 5

## 2024-02-10 NOTE — Patient Instructions (Signed)
 [facility] - A DEPT OF [facility]  Discharge Instructions: Thank you for choosing [facility] to provide your oncology and hematology care.   If you have a lab appointment with the Cancer Center, please go directly to the Cancer Center and check in at the registration area.   Wear comfortable clothing and clothing appropriate for easy access to any Portacath or PICC line.   We strive to give you quality time with your provider. You may need to reschedule your appointment if you arrive late (15 or more minutes).  Arriving late affects you and other patients whose appointments are after yours.  Also, if you miss three or more appointments without notifying the office, you may be dismissed from the clinic at the provider's discretion.      For prescription refill requests, have your pharmacy contact our office and allow 72 hours for refills to be completed.    Today you received the following chemotherapy and/or immunotherapy agents: daratumumab-hyaluronidase-fihj      To help prevent nausea and vomiting after your treatment, we encourage you to take your nausea medication as directed.  BELOW ARE SYMPTOMS THAT SHOULD BE REPORTED IMMEDIATELY: *FEVER GREATER THAN 100.4 F (38 C) OR HIGHER *CHILLS OR SWEATING *NAUSEA AND VOMITING THAT IS NOT CONTROLLED WITH YOUR NAUSEA MEDICATION *UNUSUAL SHORTNESS OF BREATH *UNUSUAL BRUISING OR BLEEDING *URINARY PROBLEMS (pain or burning when urinating, or frequent urination) *BOWEL PROBLEMS (unusual diarrhea, constipation, pain near the anus) TENDERNESS IN MOUTH AND THROAT WITH OR WITHOUT PRESENCE OF ULCERS (sore throat, sores in mouth, or a toothache) UNUSUAL RASH, SWELLING OR PAIN  UNUSUAL VAGINAL DISCHARGE OR ITCHING   Items with * indicate a potential emergency and should be followed up as soon as possible or go to the Emergency Department if any problems should occur.  Please show the CHEMOTHERAPY ALERT  CARD or IMMUNOTHERAPY ALERT CARD at check-in to the Emergency Department and triage nurse.  Should you have questions after your visit or need to cancel or reschedule your appointment, please contact [facility] - A DEPT OF [facility]  Dept: [phone]  and follow the prompts.  Office hours are 8:00 a.m. to 4:30 p.m. Monday - Friday. Please note that voicemails left after 4:00 p.m. may not be returned until the following business day.  We are closed weekends and major holidays. You have access to a nurse at all times for urgent questions. Please call the main number to the clinic Dept: [phone] and follow the prompts.   For any non-urgent questions, you may also contact your provider using MyChart. We now offer e-Visits for anyone 37 and older to request care online for non-urgent symptoms. For details visit [email or web address].   Also download the MyChart app! Go to the app store, search "MyChart", open the app, select [facility], and log in with your MyChart username and password.

## 2024-02-10 NOTE — Progress Notes (Signed)
 Contacted pt. Pt has 7 Revlimid  pills left . (Taking pill one today) Pt will then take 7 days off of his revlimid  and then restart 21 days on and 7 days off. Pt acknowledged information and verbalized understanding. Pharmacy / [facility] updated with information to fill medication as written.

## 2024-02-11 DIAGNOSIS — K4090 Unilateral inguinal hernia, without obstruction or gangrene, not specified as recurrent: Secondary | ICD-10-CM | POA: Diagnosis not present

## 2024-02-13 ENCOUNTER — Other Ambulatory Visit

## 2024-02-13 ENCOUNTER — Ambulatory Visit

## 2024-02-25 ENCOUNTER — Inpatient Hospital Stay (HOSPITAL_BASED_OUTPATIENT_CLINIC_OR_DEPARTMENT_OTHER): Admitting: Physician Assistant

## 2024-02-25 ENCOUNTER — Other Ambulatory Visit: Payer: Self-pay

## 2024-02-25 ENCOUNTER — Inpatient Hospital Stay

## 2024-02-25 VITALS — BP 115/77 | HR 65 | Temp 98.1°F | Resp 17 | Ht 71.0 in | Wt 176.0 lb

## 2024-02-25 DIAGNOSIS — C9000 Multiple myeloma not having achieved remission: Secondary | ICD-10-CM

## 2024-02-25 DIAGNOSIS — Z5112 Encounter for antineoplastic immunotherapy: Secondary | ICD-10-CM | POA: Diagnosis not present

## 2024-02-25 LAB — CMP (CANCER CENTER ONLY)
ALT: 12 U/L (ref 0–44)
AST: 13 U/L — ABNORMAL LOW (ref 15–41)
Albumin: 4.1 g/dL (ref 3.5–5.0)
Alkaline Phosphatase: 38 U/L (ref 38–126)
Anion gap: 5 (ref 5–15)
BUN: 15 mg/dL (ref 8–23)
CO2: 27 mmol/L (ref 22–32)
Calcium: 8.7 mg/dL — ABNORMAL LOW (ref 8.9–10.3)
Chloride: 110 mmol/L (ref 98–111)
Creatinine: 0.92 mg/dL (ref 0.61–1.24)
GFR, Estimated: >60 mL/min (ref >60)
Glucose, Bld: 110 mg/dL — ABNORMAL HIGH (ref 70–99)
Potassium: 4.3 mmol/L (ref 3.5–5.1)
Sodium: 142 mmol/L (ref 135–145)
Total Bilirubin: 0.4 mg/dL (ref 0.0–1.2)
Total Protein: 5.7 g/dL — ABNORMAL LOW (ref 6.5–8.1)

## 2024-02-25 LAB — CBC WITH DIFFERENTIAL (CANCER CENTER ONLY)
Abs Immature Granulocytes: 0 10*3/uL (ref 0.00–0.07)
Basophils Absolute: 0.1 10*3/uL (ref 0.0–0.1)
Basophils Relative: 2 %
Eosinophils Absolute: 0.2 10*3/uL (ref 0.0–0.5)
Eosinophils Relative: 4 %
HCT: 33.2 % — ABNORMAL LOW (ref 39.0–52.0)
Hemoglobin: 11.5 g/dL — ABNORMAL LOW (ref 13.0–17.0)
Immature Granulocytes: 0 %
Lymphocytes Relative: 28 %
Lymphs Abs: 1.6 10*3/uL (ref 0.7–4.0)
MCH: 32.8 pg (ref 26.0–34.0)
MCHC: 34.6 g/dL (ref 30.0–36.0)
MCV: 94.6 fL (ref 80.0–100.0)
Monocytes Absolute: 0.7 10*3/uL (ref 0.1–1.0)
Monocytes Relative: 12 %
Neutro Abs: 3.1 10*3/uL (ref 1.7–7.7)
Neutrophils Relative %: 54 %
Platelet Count: 228 10*3/uL (ref 150–400)
RBC: 3.51 MIL/uL — ABNORMAL LOW (ref 4.22–5.81)
RDW: 14.2 % (ref 11.5–15.5)
WBC Count: 5.6 10*3/uL (ref 4.0–10.5)
nRBC: 0 % (ref 0.0–0.2)

## 2024-02-25 MED ORDER — DIPHENHYDRAMINE HCL 25 MG PO CAPS
50.0000 mg | ORAL_CAPSULE | Freq: Once | ORAL | Status: AC
  Administered 2024-02-25: 50 mg via ORAL
  Filled 2024-02-25: qty 2

## 2024-02-25 MED ORDER — DARATUMUMAB-HYALURONIDASE-FIHJ 1800-30000 MG-UT/15ML ~~LOC~~ SOLN
1800.0000 mg | Freq: Once | SUBCUTANEOUS | Status: AC
  Administered 2024-02-25: 1800 mg via SUBCUTANEOUS
  Filled 2024-02-25: qty 15

## 2024-02-25 MED ORDER — DEXAMETHASONE 4 MG PO TABS
20.0000 mg | ORAL_TABLET | Freq: Once | ORAL | Status: AC
  Administered 2024-02-25: 20 mg via ORAL
  Filled 2024-02-25: qty 5

## 2024-02-25 MED ORDER — ACETAMINOPHEN 325 MG PO TABS
650.0000 mg | ORAL_TABLET | Freq: Once | ORAL | Status: AC
  Administered 2024-02-25: 650 mg via ORAL
  Filled 2024-02-25: qty 2

## 2024-02-25 NOTE — Patient Instructions (Signed)

## 2024-02-25 NOTE — Progress Notes (Signed)
 HEMATOLOGY/ONCOLOGY CLINIC NOTE  Date of Service: 02/25/2024  Patient Care Team: Colin Israel, MD as PCP - General (Hematology) Colin Calderon, Colin Canner, MD as Referring Physician (Neurology)  CHIEF COMPLAINTS/PURPOSE OF CONSULTATION:  F/u for evaluation and management of multiple myeloma  INTERVAL HISTORY:  Colin Calderon returns for follow-up for follow-up for Cycle 4, Day 15 of Dara/Rev/Dex. He was last seen by Dr. Salomon Calderon on 01/27/2024. In the interim, he was assessed by general surgeon for evaluation of inguinal hernia repair.  Mr. Colin Calderon reports he tolerated treatment without any concerning toxicities.  His energy and appetite are adequate.  He denies nausea, vomiting or bowel habit changes.  He denies easy bruising or signs of overt bleeding such as hematochezia or melena. He denies any new bone or back pain.  He denies fevers, chills, night sweats, shortness of breath, chest pain or cough.  He has no other complaints.   MEDICAL HISTORY:  No past medical history on file.  SURGICAL HISTORY: Past Surgical History:  Procedure Laterality Date   HERNIA REPAIR      SOCIAL HISTORY: Social History   Socioeconomic History   Marital status: Married    Spouse name: Not on file   Number of children: Not on file   Years of education: Not on file   Highest education level: Not on file  Occupational History   Not on file  Tobacco Use   Smoking status: Never   Smokeless tobacco: Not on file  Substance and Sexual Activity   Alcohol use: Yes   Drug use: No   Sexual activity: Not on file  Other Topics Concern   Not on file  Social History Narrative   Not on file   Social Drivers of Health   Financial Resource Strain: Low Risk  (07/01/2023)   Overall Financial Resource Strain (CARDIA)    Difficulty of Paying Living Expenses: Not hard at all  Food Insecurity: Low Risk  (10/16/2023)   Received from [facility]   Hunger Vital Sign    Within the past 12 months, you  worried that your food would run out before you got money to buy more: Never true    Within the past 12 months, the food you bought just didn't last and you didn't have money to get more. : Never true  Transportation Needs: No Transportation Needs (10/16/2023)   Received from [facility]    In the past 12 months, has lack of reliable transportation kept you from medical appointments, meetings, work or from getting things needed for daily living? : No  Physical Activity: Sufficiently Active (07/01/2023)   Exercise Vital Sign    Days of Exercise per Week: 7 days    Minutes of Exercise per Session: 40 min  Stress: No Stress Concern Present (07/01/2023)   [employer] of Occupational Health - Occupational Stress Questionnaire    Feeling of Stress : Not at all  Social Connections: Socially Integrated (07/01/2023)   Social Connection and Isolation Panel    Frequency of Communication with Friends and Family: Not on file    Frequency of Social Gatherings with Friends and Family: Three times a week    Attends Religious Services: More than 4 times per year    Active Member of Clubs or Organizations: Yes    Attends Banker Meetings: More than 4 times per year    Marital Status: Married  Catering manager Violence: Not At Risk (07/01/2023)   Humiliation, Afraid,  Rape, and Kick questionnaire    Fear of Current or Ex-Partner: No    Emotionally Abused: No    Physically Abused: No    Sexually Abused: No    FAMILY HISTORY: No family history on file.  ALLERGIES:  is allergic to amoxicillin.  MEDICATIONS:  Current Outpatient Medications  Medication Sig Dispense Refill   acyclovir  (ZOVIRAX ) 400 MG tablet Take 1 tablet (400 mg total) by mouth 2 (two) times daily. 60 tablet 5   aspirin  EC 81 MG tablet Take 1 tablet (81 mg total) by mouth daily. 100 tablet 3   Cyanocobalamin  5000 MCG/ML LIQD 1 tablet 1 day or 1 dose.     dexamethasone  (DECADRON ) 4 MG tablet Take 5  tabs (20 mg) weekly the day after daratumumab  for 12 weeks. Take with breakfast. (Patient taking differently: Take 5 tabs (20 mg) weekly the day of daratumumab  for 12 weeks. Take with breakfast. Taking the day of treatment) 20 tablet 5   lenalidomide  (REVLIMID ) 15 MG capsule Take 1 capsule by mouth once daily for 21 days, then 7 days off. 21 capsule 10   ondansetron  (ZOFRAN ) 8 MG tablet Take 1 tablet (8 mg total) by mouth every 8 (eight) hours as needed for nausea or vomiting. 30 tablet 1   prochlorperazine  (COMPAZINE ) 10 MG tablet Take 1 tablet (10 mg total) by mouth every 6 (six) hours as needed for nausea or vomiting. 30 tablet 1   No current facility-administered medications for this visit.    REVIEW OF SYSTEMS:    10 Point review of Systems was done is negative except as noted above.   PHYSICAL EXAMINATION: ECOG PERFORMANCE STATUS: 1 - Symptomatic but completely ambulatory   Vitals:   02/25/24 1107  BP: 115/77  Pulse: 65  Resp: 17  Temp: 98.1 F (36.7 C)  SpO2: 99%    Filed Weights   02/25/24 1107  Weight: 176 lb (79.8 kg)   .Body mass index is 24.55 kg/m.   GENERAL:alert, in no acute distress and comfortable SKIN: no acute rashes, no significant lesions EYES: conjunctiva are pink and non-injected, sclera anicteric LUNGS: clear to auscultation b/l with normal respiratory effort HEART: regular rate & rhythm Extremity: no pedal edema PSYCH: alert & oriented x 3 with fluent speech NEURO: no focal motor/sensory deficits   LABORATORY DATA:  I have reviewed the data as listed .    Latest Ref Rng & Units 02/25/2024   10:42 AM 02/10/2024    1:29 PM 01/27/2024    9:55 AM  CBC  WBC 4.0 - 10.5 K/uL 5.6  5.1  4.5   Hemoglobin 13.0 - 17.0 g/dL 21.3  08.6  57.8   Hematocrit 39.0 - 52.0 % 33.2  34.5  34.8   Platelets 150 - 400 K/uL 228  244  249     .    Latest Ref Rng & Units 02/25/2024   10:42 AM 02/10/2024    1:29 PM 01/13/2024    2:02 PM  CMP  Glucose 70 - 99 mg/dL  469  629  528   BUN 8 - 23 mg/dL 15  19  27    Creatinine 0.61 - 1.24 mg/dL 4.13  2.44  0.10   Sodium 135 - 145 mmol/L 142  140  140   Potassium 3.5 - 5.1 mmol/L 4.3  4.7  4.3   Chloride 98 - 111 mmol/L 110  107  110   CO2 22 - 32 mmol/L 27  30  27    Calcium  8.9 - 10.3 mg/dL 8.7  9.1  8.5   Total Protein 6.5 - 8.1 g/dL 5.7  5.9  5.5   Total Bilirubin 0.0 - 1.2 mg/dL 0.4  0.4  0.3   Alkaline Phos 38 - 126 U/L 38  44  44   AST 15 - 41 U/L 13  11  11    ALT 0 - 44 U/L 12  13  10       07/17/2023 Surgical pathology:   07/17/2023 Surgical pathology:      RADIOGRAPHIC STUDIES: I have personally reviewed the radiological images as listed and agreed with the findings in the report. No results found.   ASSESSMENT & PLAN:  NYGEL PROKOP is a 74 y.o. male who returns for a follow up for multiple meyloma.   #IgG Lambda  Multiple myeloma -Kappa light chain producing. -BMBx from 07/17/2023 showed two plasmacytoid populations, one kappa and one lambda restricted. The predominant clone is CD20 positive and CD5 and CD10 negative.  -Patient started Dara/Rev/Dex on 11/07/2023.  -Revlimid  was dose reduced from 25 mg to 15 mg on 12/23/2023 due to cytopenias.  PLAN: -Due for Cycle 4, Day 15 of Dara/Rev/Dex. -Patient has restarted his new cycle of Revlimid  with 21 days on and 7 days off. -Labs from today were reviewed and adequate for treatment. WBC 5.6, Hgb 11.5, Plt 228, Creatinine 0.92, calcium 8.7.  -Proceed with treatment without any dose modifications.  - Continue aspirin  81 mg p.o. 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 The patient knows to call the clinic with any problems, questions or concerns.  I have spent a total of 30 minutes minutes of face-to-face and non-face-to-face time, preparing to see the patient,  performing a medically  appropriate examination, counseling and educating the patient, documenting clinical information in the electronic health record, independently interpreting results and communicating results to the patient, and care coordination.   Wyline Hearing PA-C Dept of Hematology and Oncology [facility] at [facility] Phone: [phone]

## 2024-02-26 ENCOUNTER — Telehealth: Payer: Self-pay | Admitting: Hematology

## 2024-02-26 LAB — KAPPA/LAMBDA LIGHT CHAINS
Kappa free light chain: 159.3 mg/L — ABNORMAL HIGH (ref 3.3–19.4)
Kappa, lambda light chain ratio: 88.5 — ABNORMAL HIGH (ref 0.26–1.65)
Lambda free light chains: 1.8 mg/L — ABNORMAL LOW (ref 5.7–26.3)

## 2024-02-26 NOTE — Telephone Encounter (Signed)
 Rescheduled appointments per 6/18 los. Talked with the patient and he is aware of the changes made to his upcoming appointments.

## 2024-02-27 ENCOUNTER — Other Ambulatory Visit: Payer: Self-pay

## 2024-03-03 DIAGNOSIS — C9000 Multiple myeloma not having achieved remission: Secondary | ICD-10-CM | POA: Diagnosis not present

## 2024-03-03 NOTE — Progress Notes (Signed)
 [facility]  Name: Colin Calderon MRN:  [id number] Date:  03/03/2024  PCP: Gwenn SHAUNNA Shams, MD  Referring physician: Norleen Debby Funk, [facility] [place],  [place] [address]  Diagnosis: Multiple Myeloma, IgG Lambda Date of Diagnosis: 07/17/23 Stage: R-ISS stage I (alb 4.3, LHD 122) Pathology: Plasma cell myeloma in a hypercellular bone marrow with decreased trilineage hematopoiesis and approximately 60-70% kappa-restricted plasma cells.  Previous Treatment: none Current Treatment:  D-Rvd (rev held due to concern for neuropathy/carpal tunnel) C1: ~02/25   HPI: 73 yo M presents for evaluation of abnormal plasmacytoid population on bone marrow biopsy.  Patient with long standing history of macrocytic anemia that was previously attributed to B12 def.  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 Due to abnormal findings, bone marrow biopsy was performed which showed two plasmacytoid populations, one kappa and one lambda restricted.  The predominant clone is CD20 positive and CD5 and CD10 negative. Patient has overall felt fairly well.  He denies any fevers, chills or night sweats.  Denies any vision or hearing changes.  No new pains.  EMG and EEG scheduled in future.  Interim History: Patient presents for follow up of his myeloma.  His most recent cycle of revlimid  (now on 21 day cycle) has been complicated by rash, some pruritus.   Some fatigue.  No swelling.  No vision or hearing changes.  REVIEW OF SYSTEMS: Negative except as stated above  ECOG 1 - Symptomatic but completely ambulatory, Karnofsky Performance Scale:  90% - Able to carry on normal activity; minor signs or symptoms of disease  PAST MEDICAL HISTORY: he has a past medical history of Anemia and History of adenomatous polyp of  colon.  PAST SURGICAL HISTORY:  has a past surgical history that includes Colonoscopy and Inguinal Hernia Repair (Right).  MEDICATIONS:  Meds Ordered in [facility]  Medication Sig Dispense Refill  . acyclovir  (ZOVIRAX ) 400 mg tablet Take 400 mg by mouth.    . aspirin  81 mg EC tablet Take 81 mg by mouth.    . cyanocobalamin -cobamamide (B-12 Plus) 5,000-100 mcg subl Take 1 tablet by mouth daily.    . cyanocobalamin /folic acid  (vitamin B12-folic acid ) 1,000-400 mcg lozg Take 1 tablet by mouth daily.    . dexAMETHasone  (DECADRON ) 4 mg tablet Take 5 tabs (20 mg) weekly the day after daratumumab  for 12 weeks. Take with breakfast.    . lenalidomide  (REVLIMID ) 15 mg cap Take 1 capsule by mouth once daily for 21 days, then 7 days off.    . ondansetron  (ZOFRAN ) 8 mg tablet Take 8 mg by mouth every 8 (eight) hours as needed for nausea or vomiting.    . prochlorperazine  (COMPAZINE ) 10 mg tablet Take 10 mg by mouth every 6 (six) hours as needed for nausea or vomiting.    . azithromycin  (ZITHROMAX ) 250 mg tablet Take 250 mg by mouth. (Patient not taking: Reported on 03/03/2024)    . benzonatate  (TESSALON ) 100 mg capsule Take 100 mg by mouth. (Patient not taking: Reported on 03/03/2024)    . diclofenac sodium (DICLO GEL TOP) Apply topically. (Patient not taking: Reported on 03/03/2024)     No current Epic-ordered facility-administered medications on file.    ALLERGIES:  Allergies  Allergen Reactions  . Amoxicillin Itching    SOCIAL HISTORY:  reports that he has never  smoked. He has never used smokeless tobacco. He reports current alcohol use. He reports that he does not currently use drugs.   FAMILY HISTORY: family history includes Coronary artery disease in his father; Leukemia (age of onset: 22) in his brother.  No other family history of leukemia, lymphoma, or myeloma.   PHYSICAL EXAMINATION: VITAL SIGNS:  Review Flowsheet  More data exists      10/16/2023 10/23/2023 01/07/2024 03/03/2024  ONCBCN  RECENT VITALS  Height 180 cm 180 cm 180 cm 180 cm  Weight 79.5 kg 79.5 kg 77.701 kg 78.563 kg  BSA (Calculated - sq m) 2 m2 2 m2 1.97 m2 1.98 m2  Temp 97.5 F (36.4 C) 97.3 F (36.3 C) 97.1 F (36.2 C) 97.3 F (36.3 C)  BP 121/67 115/70 110/60 108/62  Heart Rate 64 79 65 59  Respiratory Rate 18 18 16 16   Oxygen  Saturation 100 % 100 % 98 % 97 %   GENERAL: WDWN male in NAD, sitting comfortably in bedside chair HEENT: Anicteric sclerae, EOMI, PERRL. Mucous membranes moist without oropharyngeal lesions. NECK: Supple without JVD or thyromegaly. LUNGS: Clear to auscultation without wheezes, crackles or rhonchi. CARDIAC: Regular rate and rhythm, S1, S2, no murmurs, rubs or gallops. ABDOMEN: Soft, non distended, non tender, normoactive bowel sounds. No hepatosplenomegaly EXTREMITIES: No edema, clubbing or cyanosis. 2+ pulses distally. NEUROLOGIC: Cranial nerves III-XII grossly symmetric and intact. SKIN: No petechiae, large ecchymoses, or rash noted. LYMPH: No cervical, supraclavicular, axillary or inguinal adenopathy  LABORATORY DATA: No results found for this or any previous visit (from the past week).  RADIOGRAPHIC DATA: PET 07/26/23 1. Hypermetabolic lymph nodes in the low back, chest and upper  abdomen (Deauville 4-5).  2. Left anterior descending coronary artery calcification.  3. Enlarged prostate. Associated bladder wall thickening, indicative of an element of outlet obstruction.   PATHOLOGY DATA:  BMBx 07/17/23 Plasma cell myeloma in a hypercellular bone marrow with decreased trilineage hematopoiesis and approximately 60-70% kappa-restricted plasma cells.  The bone marrow is hypercellular (70%) and shows decreased myelopoiesis and erythropoiesis. Megakaryocytes are decreased with normal morphology. Blasts are not increased. Large collections of atypical small plasma cell are seen that comprise 60-70% of the cellularity. These cells are CD138, Cyclin D1 and CD20 positive and  kappa light chain restricted. These cells are CD56 negative. A small population of lambda restricted plasma cells is also seen. These cells are CD56 positive. A Prussian blue stain of the clot highlights the presence of storage iron.  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  Bone marrow was revealing of two populations, the predominant clone appears to be the kappa restricted clone, CD 20 positive and CD10/CD5 negative, no elevation in IgM.  Path reviewed at [facility] and most consistent with myeloma.  MYD 88 negative.  R-ISS stage I with t(11:14).  ECOG 0-1. He has started on treatment with D-Rd and tolerating relatively well.  Having some issues with cytopenias, neutropenia.  (Velcade held due to concern for carpal tunnel/neuropathy) Was previously transitioned to 1-7, 15-21 dosing but transitioned to day 1-21 dosing with this previous cycle, having some issues with rash as progressing through cycle.  Discussed supporting  with topical creams. Most recent labs showing slight increase in light chains, would repeat again the following week.  If continues to show increase/plateau, would look to add PI and watch closely for any neuropathy. He is less interested in transplant at this time but suggested possibility of H&H in future which he is willing to consider.  Also briefly discussed CART/BITe at patient/family request. - would repeat light chains the following week - if plateau/increase would add velvade - will continue to discuss transplant/H&H in future  RTC 2 months

## 2024-03-06 ENCOUNTER — Other Ambulatory Visit: Payer: Self-pay

## 2024-03-06 DIAGNOSIS — C9000 Multiple myeloma not having achieved remission: Secondary | ICD-10-CM

## 2024-03-06 MED ORDER — LENALIDOMIDE 15 MG PO CAPS: 15.0000 mg | ORAL_CAPSULE | Freq: Every day | ORAL | 0 refills | Status: DC

## 2024-03-09 ENCOUNTER — Inpatient Hospital Stay

## 2024-03-09 ENCOUNTER — Ambulatory Visit

## 2024-03-09 ENCOUNTER — Other Ambulatory Visit

## 2024-03-09 ENCOUNTER — Ambulatory Visit: Admitting: Hematology

## 2024-03-09 VITALS — BP 111/67 | HR 60 | Temp 98.6°F | Resp 18 | Wt 178.2 lb

## 2024-03-09 DIAGNOSIS — C9000 Multiple myeloma not having achieved remission: Secondary | ICD-10-CM

## 2024-03-09 DIAGNOSIS — Z5112 Encounter for antineoplastic immunotherapy: Secondary | ICD-10-CM | POA: Diagnosis not present

## 2024-03-09 LAB — CBC WITH DIFFERENTIAL (CANCER CENTER ONLY)
Abs Immature Granulocytes: 0 10*3/uL (ref 0.00–0.07)
Basophils Absolute: 0 10*3/uL (ref 0.0–0.1)
Basophils Relative: 1 %
Eosinophils Absolute: 0.3 10*3/uL (ref 0.0–0.5)
Eosinophils Relative: 7 %
HCT: 33.6 % — ABNORMAL LOW (ref 39.0–52.0)
Hemoglobin: 11.5 g/dL — ABNORMAL LOW (ref 13.0–17.0)
Immature Granulocytes: 0 %
Lymphocytes Relative: 27 %
Lymphs Abs: 1 10*3/uL (ref 0.7–4.0)
MCH: 32.7 pg (ref 26.0–34.0)
MCHC: 34.2 g/dL (ref 30.0–36.0)
MCV: 95.5 fL (ref 80.0–100.0)
Monocytes Absolute: 0.4 10*3/uL (ref 0.1–1.0)
Monocytes Relative: 10 %
Neutro Abs: 2.1 10*3/uL (ref 1.7–7.7)
Neutrophils Relative %: 55 %
Platelet Count: 226 10*3/uL (ref 150–400)
RBC: 3.52 MIL/uL — ABNORMAL LOW (ref 4.22–5.81)
RDW: 14.1 % (ref 11.5–15.5)
WBC Count: 3.8 10*3/uL — ABNORMAL LOW (ref 4.0–10.5)
nRBC: 0 % (ref 0.0–0.2)

## 2024-03-09 LAB — CMP (CANCER CENTER ONLY)
ALT: 14 U/L (ref 0–44)
AST: 13 U/L — ABNORMAL LOW (ref 15–41)
Albumin: 4.2 g/dL (ref 3.5–5.0)
Alkaline Phosphatase: 40 U/L (ref 38–126)
Anion gap: 5 (ref 5–15)
BUN: 15 mg/dL (ref 8–23)
CO2: 30 mmol/L (ref 22–32)
Calcium: 8.9 mg/dL (ref 8.9–10.3)
Chloride: 107 mmol/L (ref 98–111)
Creatinine: 0.96 mg/dL (ref 0.61–1.24)
GFR, Estimated: >60 mL/min (ref >60)
Glucose, Bld: 112 mg/dL — ABNORMAL HIGH (ref 70–99)
Potassium: 4.1 mmol/L (ref 3.5–5.1)
Sodium: 142 mmol/L (ref 135–145)
Total Bilirubin: 0.6 mg/dL (ref 0.0–1.2)
Total Protein: 5.8 g/dL — ABNORMAL LOW (ref 6.5–8.1)

## 2024-03-09 MED ORDER — DIPHENHYDRAMINE HCL 25 MG PO CAPS
50.0000 mg | ORAL_CAPSULE | Freq: Once | ORAL | Status: AC
  Administered 2024-03-09: 50 mg via ORAL
  Filled 2024-03-09: qty 2

## 2024-03-09 MED ORDER — DARATUMUMAB-HYALURONIDASE-FIHJ 1800-30000 MG-UT/15ML ~~LOC~~ SOLN
1800.0000 mg | Freq: Once | SUBCUTANEOUS | Status: AC
  Administered 2024-03-09: 1800 mg via SUBCUTANEOUS
  Filled 2024-03-09: qty 15

## 2024-03-09 MED ORDER — DEXAMETHASONE 4 MG PO TABS
20.0000 mg | ORAL_TABLET | Freq: Once | ORAL | Status: AC
  Administered 2024-03-09: 20 mg via ORAL
  Filled 2024-03-09: qty 5

## 2024-03-09 MED ORDER — ACETAMINOPHEN 325 MG PO TABS
650.0000 mg | ORAL_TABLET | Freq: Once | ORAL | Status: AC
  Administered 2024-03-09: 650 mg via ORAL
  Filled 2024-03-09: qty 2

## 2024-03-10 LAB — KAPPA/LAMBDA LIGHT CHAINS
Kappa free light chain: 70 mg/L — ABNORMAL HIGH (ref 3.3–19.4)
Kappa, lambda light chain ratio: 30.43 — ABNORMAL HIGH (ref 0.26–1.65)
Lambda free light chains: 2.3 mg/L — ABNORMAL LOW (ref 5.7–26.3)

## 2024-03-11 LAB — MULTIPLE MYELOMA PANEL, SERUM
Albumin SerPl Elph-Mcnc: 3.8 g/dL (ref 2.9–4.4)
Albumin/Glob SerPl: 2.6 — ABNORMAL HIGH (ref 0.7–1.7)
Alpha 1: 0.2 g/dL (ref 0.0–0.4)
Alpha2 Glob SerPl Elph-Mcnc: 0.5 g/dL (ref 0.4–1.0)
B-Globulin SerPl Elph-Mcnc: 0.7 g/dL (ref 0.7–1.3)
Gamma Glob SerPl Elph-Mcnc: 0.2 g/dL — ABNORMAL LOW (ref 0.4–1.8)
Globulin, Total: 1.5 g/dL — ABNORMAL LOW (ref 2.2–3.9)
IgA: 6 mg/dL — ABNORMAL LOW (ref 61–437)
IgG (Immunoglobin G), Serum: 305 mg/dL — ABNORMAL LOW (ref 603–1613)
IgM (Immunoglobulin M), Srm: <5 mg/dL — ABNORMAL LOW (ref 15–143)
M Protein SerPl Elph-Mcnc: 0.1 g/dL — ABNORMAL HIGH
Total Protein ELP: 5.3 g/dL — ABNORMAL LOW (ref 6.0–8.5)

## 2024-03-23 ENCOUNTER — Encounter: Payer: Self-pay | Admitting: Hematology

## 2024-03-23 ENCOUNTER — Other Ambulatory Visit: Payer: Self-pay | Admitting: Physician Assistant

## 2024-03-23 DIAGNOSIS — C9000 Multiple myeloma not having achieved remission: Secondary | ICD-10-CM

## 2024-03-24 ENCOUNTER — Inpatient Hospital Stay: Attending: Oncology

## 2024-03-24 ENCOUNTER — Inpatient Hospital Stay (HOSPITAL_BASED_OUTPATIENT_CLINIC_OR_DEPARTMENT_OTHER): Admitting: Physician Assistant

## 2024-03-24 ENCOUNTER — Inpatient Hospital Stay

## 2024-03-24 VITALS — BP 110/69 | HR 64 | Temp 97.6°F | Resp 13 | Wt 176.3 lb

## 2024-03-24 DIAGNOSIS — Z5112 Encounter for antineoplastic immunotherapy: Secondary | ICD-10-CM | POA: Diagnosis not present

## 2024-03-24 DIAGNOSIS — Z5111 Encounter for antineoplastic chemotherapy: Secondary | ICD-10-CM | POA: Diagnosis not present

## 2024-03-24 DIAGNOSIS — Z79899 Other long term (current) drug therapy: Secondary | ICD-10-CM | POA: Diagnosis not present

## 2024-03-24 DIAGNOSIS — C9000 Multiple myeloma not having achieved remission: Secondary | ICD-10-CM | POA: Insufficient documentation

## 2024-03-24 DIAGNOSIS — Z7982 Long term (current) use of aspirin: Secondary | ICD-10-CM | POA: Diagnosis not present

## 2024-03-24 LAB — CMP (CANCER CENTER ONLY)
ALT: 19 U/L (ref 0–44)
AST: 18 U/L (ref 15–41)
Albumin: 3.9 g/dL (ref 3.5–5.0)
Alkaline Phosphatase: 37 U/L — ABNORMAL LOW (ref 38–126)
Anion gap: 4 — ABNORMAL LOW (ref 5–15)
BUN: 16 mg/dL (ref 8–23)
CO2: 29 mmol/L (ref 22–32)
Calcium: 8.8 mg/dL — ABNORMAL LOW (ref 8.9–10.3)
Chloride: 111 mmol/L (ref 98–111)
Creatinine: 0.98 mg/dL (ref 0.61–1.24)
GFR, Estimated: >60 mL/min (ref >60)
Glucose, Bld: 115 mg/dL — ABNORMAL HIGH (ref 70–99)
Potassium: 4.3 mmol/L (ref 3.5–5.1)
Sodium: 144 mmol/L (ref 135–145)
Total Bilirubin: 0.5 mg/dL (ref 0.0–1.2)
Total Protein: 5.5 g/dL — ABNORMAL LOW (ref 6.5–8.1)

## 2024-03-24 LAB — CBC WITH DIFFERENTIAL (CANCER CENTER ONLY)
Abs Immature Granulocytes: 0 K/uL (ref 0.00–0.07)
Basophils Absolute: 0.1 K/uL (ref 0.0–0.1)
Basophils Relative: 3 %
Eosinophils Absolute: 0.2 K/uL (ref 0.0–0.5)
Eosinophils Relative: 6 %
HCT: 34.2 % — ABNORMAL LOW (ref 39.0–52.0)
Hemoglobin: 11.7 g/dL — ABNORMAL LOW (ref 13.0–17.0)
Immature Granulocytes: 0 %
Lymphocytes Relative: 38 %
Lymphs Abs: 1.3 K/uL (ref 0.7–4.0)
MCH: 32.2 pg (ref 26.0–34.0)
MCHC: 34.2 g/dL (ref 30.0–36.0)
MCV: 94.2 fL (ref 80.0–100.0)
Monocytes Absolute: 0.5 K/uL (ref 0.1–1.0)
Monocytes Relative: 14 %
Neutro Abs: 1.4 K/uL — ABNORMAL LOW (ref 1.7–7.7)
Neutrophils Relative %: 39 %
Platelet Count: 198 K/uL (ref 150–400)
RBC: 3.63 MIL/uL — ABNORMAL LOW (ref 4.22–5.81)
RDW: 14.1 % (ref 11.5–15.5)
WBC Count: 3.5 K/uL — ABNORMAL LOW (ref 4.0–10.5)
nRBC: 0 % (ref 0.0–0.2)

## 2024-03-24 MED ORDER — DEXAMETHASONE 4 MG PO TABS
20.0000 mg | ORAL_TABLET | Freq: Once | ORAL | Status: AC
  Administered 2024-03-24: 20 mg via ORAL
  Filled 2024-03-24: qty 5

## 2024-03-24 MED ORDER — ACETAMINOPHEN 325 MG PO TABS
650.0000 mg | ORAL_TABLET | Freq: Once | ORAL | Status: AC
  Administered 2024-03-24: 650 mg via ORAL
  Filled 2024-03-24: qty 2

## 2024-03-24 MED ORDER — DARATUMUMAB-HYALURONIDASE-FIHJ 1800-30000 MG-UT/15ML ~~LOC~~ SOLN
1800.0000 mg | Freq: Once | SUBCUTANEOUS | Status: AC
  Administered 2024-03-24: 1800 mg via SUBCUTANEOUS
  Filled 2024-03-24: qty 15

## 2024-03-24 MED ORDER — DIPHENHYDRAMINE HCL 25 MG PO CAPS
50.0000 mg | ORAL_CAPSULE | Freq: Once | ORAL | Status: AC
  Administered 2024-03-24: 50 mg via ORAL
  Filled 2024-03-24: qty 2

## 2024-03-24 NOTE — Progress Notes (Signed)
 Per [place] Police, [place], ok to treat with decreased ANC

## 2024-03-24 NOTE — Progress Notes (Signed)
 HEMATOLOGY/ONCOLOGY CLINIC NOTE  Date of Service: 03/24/24  Patient Care Team: [name], MD as PCP - General (Hematology) Huey, [name], MD as Referring Physician (Neurology)  CHIEF COMPLAINTS/PURPOSE OF CONSULTATION:  F/u for evaluation and management of multiple myeloma  INTERVAL HISTORY:  [name] returns for follow-up for follow-up for Cycle 4, Day 15 of Dara/Rev/Dex. He was last seen by me on 6/172025. In the interim, he continued on Dara/Rev/Dex therapy.  Mr. Emberton reports he is tolerating treatment without any new or significant side effects.  His energy levels have improved since starting treatment.  He does have fatigue but he is able to be more active and continues to walk 2.5 miles per day.  He has a good appetite without any weight changes.  He denies nausea, vomiting or bowel habit changes.  He denies easy bruising or signs of active bleeding.  He denies any new bone pain or back pain.  He is otherwise doing well and would like to proceed with treatment.  He denies fevers, chills, night sweats, shortness of breath, chest pain or cough.    MEDICAL HISTORY:  No past medical history on file.  SURGICAL HISTORY: Past Surgical History:  Procedure Laterality Date   HERNIA REPAIR      SOCIAL HISTORY: Social History   Socioeconomic History   Marital status: Married    Spouse name: Not on file   Number of children: Not on file   Years of education: Not on file   Highest education level: Not on file  Occupational History   Not on file  Tobacco Use   Smoking status: Never   Smokeless tobacco: Not on file  Substance and Sexual Activity   Alcohol use: Yes   Drug use: No   Sexual activity: Not on file  Other Topics Concern   Not on file  Social History Narrative   Not on file   Social Drivers of Health   Financial Resource Strain: Low Risk  (07/01/2023)   Overall Financial Resource Strain (CARDIA)    Difficulty of Paying Living  Expenses: Not hard at all  Food Insecurity: Low Risk  (10/16/2023)   Received from [facility]   Hunger Vital Sign    Within the past 12 months, you worried that your food would run out before you got money to buy more: Never true    Within the past 12 months, the food you bought just didn't last and you didn't have money to get more. : Never true  Transportation Needs: No Transportation Needs (10/16/2023)   Received from [facility]    In the past 12 months, has lack of reliable transportation kept you from medical appointments, meetings, work or from getting things needed for daily living? : No  Physical Activity: Sufficiently Active (07/01/2023)   Exercise Vital Sign    Days of Exercise per Week: 7 days    Minutes of Exercise per Session: 40 min  Stress: No Stress Concern Present (07/01/2023)   [employer] of Occupational Health - Occupational Stress Questionnaire    Feeling of Stress : Not at all  Social Connections: Socially Integrated (07/01/2023)   Social Connection and Isolation Panel    Frequency of Communication with Friends and Family: Not on file    Frequency of Social Gatherings with Friends and Family: Three times a week    Attends Religious Services: More than 4 times per year    Active Member of Clubs or  Organizations: Yes    Attends Engineer, structural: More than 4 times per year    Marital Status: Married  Catering manager Violence: Not At Risk (07/01/2023)   Humiliation, Afraid, Rape, and Kick questionnaire    Fear of Current or Ex-Partner: No    Emotionally Abused: No    Physically Abused: No    Sexually Abused: No    FAMILY HISTORY: No family history on file.  ALLERGIES:  is allergic to amoxicillin.  MEDICATIONS:  Current Outpatient Medications  Medication Sig Dispense Refill   acyclovir  (ZOVIRAX ) 400 MG tablet Take 1 tablet (400 mg total) by mouth 2 (two) times daily. 60 tablet 5   aspirin  EC 81 MG tablet Take 1 tablet  (81 mg total) by mouth daily. 100 tablet 3   Cyanocobalamin  5000 MCG/ML LIQD 1 tablet 1 day or 1 dose.     lenalidomide  (REVLIMID ) 15 MG capsule Take 1 capsule (15 mg total) by mouth daily. Take 1 capsule (15 mg total) by mouth daily for 21 days. Take 7 days off and repeat cycle. 21 capsule 0   ondansetron  (ZOFRAN ) 8 MG tablet Take 1 tablet (8 mg total) by mouth every 8 (eight) hours as needed for nausea or vomiting. 30 tablet 1   prochlorperazine  (COMPAZINE ) 10 MG tablet Take 1 tablet (10 mg total) by mouth every 6 (six) hours as needed for nausea or vomiting. 30 tablet 1   No current facility-administered medications for this visit.   Facility-Administered Medications Ordered in Other Visits  Medication Dose Route Frequency Provider Last Rate Last Admin   acetaminophen  (TYLENOL ) tablet 650 mg  650 mg Oral Once Kale, Gautam Kishore, MD       daratumumab -hyaluronidase -fihj (DARZALEX  FASPRO) 1800-30000 MG-UT/15ML chemo SQ injection 1,800 mg  1,800 mg Subcutaneous Once Kale, Gautam Kishore, MD       dexamethasone  (DECADRON ) tablet 20 mg  20 mg Oral Once Kale, Gautam Kishore, MD       diphenhydrAMINE  (BENADRYL ) capsule 50 mg  50 mg Oral Once Kale, Gautam Kishore, MD        REVIEW OF SYSTEMS:    10 Point review of Systems was done is negative except as noted above.   PHYSICAL EXAMINATION: ECOG PERFORMANCE STATUS: 1 - Symptomatic but completely ambulatory   Vitals:   03/24/24 0848  BP: 110/69  Pulse: 64  Resp: 13  Temp: 97.6 F (36.4 C)  SpO2: 99%    Filed Weights   03/24/24 0848  Weight: 176 lb 4.8 oz (80 kg)   .Body mass index is 24.59 kg/m.   GENERAL:alert, in no acute distress and comfortable SKIN: no acute rashes, no significant lesions EYES: conjunctiva are pink and non-injected, sclera anicteric LUNGS: clear to auscultation b/l with normal respiratory effort HEART: regular rate & rhythm Extremity: no pedal edema PSYCH: alert & oriented x 3 with fluent speech NEURO:  no focal motor/sensory deficits   LABORATORY DATA:  I have reviewed the data as listed .    Latest Ref Rng & Units 03/24/2024    8:05 AM 03/09/2024    9:07 AM 02/25/2024   10:42 AM  CBC  WBC 4.0 - 10.5 K/uL 3.5  3.8  5.6   Hemoglobin 13.0 - 17.0 g/dL 88.2  88.4  88.4   Hematocrit 39.0 - 52.0 % 34.2  33.6  33.2   Platelets 150 - 400 K/uL 198  226  228     .    Latest Ref Rng & Units 03/24/2024  8:05 AM 03/09/2024    9:07 AM 02/25/2024   10:42 AM  CMP  Glucose 70 - 99 mg/dL 884  887  889   BUN 8 - 23 mg/dL 16  15  15    Creatinine 0.61 - 1.24 mg/dL 9.01  9.03  9.07   Sodium 135 - 145 mmol/L 144  142  142   Potassium 3.5 - 5.1 mmol/L 4.3  4.1  4.3   Chloride 98 - 111 mmol/L 111  107  110   CO2 22 - 32 mmol/L 29  30  27    Calcium 8.9 - 10.3 mg/dL 8.8  8.9  8.7   Total Protein 6.5 - 8.1 g/dL 5.5  5.8  5.7   Total Bilirubin 0.0 - 1.2 mg/dL 0.5  0.6  0.4   Alkaline Phos 38 - 126 U/L 37  40  38   AST 15 - 41 U/L 18  13  13    ALT 0 - 44 U/L 19  14  12       07/17/2023 Surgical pathology:   07/17/2023 Surgical pathology:      RADIOGRAPHIC STUDIES: I have personally reviewed the radiological images as listed and agreed with the findings in the report. No results found.   ASSESSMENT & PLAN:  Colin Calderon is a 73 y.o. male who returns for a follow up for multiple meyloma.   #IgG Lambda  Multiple myeloma -Kappa light chain producing. -BMBx from 07/17/2023 showed two plasmacytoid populations, one kappa and one lambda restricted. The predominant clone is CD20 positive and CD5 and CD10 negative.  -Patient started Dara/Rev/Dex on 11/07/2023.  -Revlimid  was dose reduced from 25 mg to 15 mg on 12/23/2023 due to cytopenias.  PLAN: -Due for Cycle 5, Day 15 of Dara/Rev/Dex. -Patient has restarted his new cycle of Revlimid  with 21 days on and 7 days off yesterday. -Labs from today were reviewed and adequate for treatment. WBC 3.5, Hgb 11.7, Plt 198, Creatinine 0.98, calcium 8.8.   -Most recent myeloma panel from 03/09/2024 showed M protein at 0.1 g/dL, kappa light chain decreased from 159.3 to 70.0. -Proceed with treatment without any dose modifications.  - Continue aspirin  81 mg p.o. 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 The patient knows to call the clinic with any problems, questions or concerns.  I have spent a total of 30 minutes minutes of face-to-face and non-face-to-face time, preparing to see the patient,  performing a medically appropriate examination, counseling and educating the patient, documenting clinical information in the electronic health record, independently interpreting results and communicating results to the patient, and care coordination.   Johnston Police PA-C Dept of Hematology and Oncology [facility] Phone: [phone]

## 2024-03-24 NOTE — Patient Instructions (Signed)

## 2024-03-25 ENCOUNTER — Other Ambulatory Visit: Payer: Self-pay

## 2024-03-25 LAB — KAPPA/LAMBDA LIGHT CHAINS
Kappa free light chain: 107 mg/L — ABNORMAL HIGH (ref 3.3–19.4)
Kappa, lambda light chain ratio: 46.52 — ABNORMAL HIGH (ref 0.26–1.65)
Lambda free light chains: 2.3 mg/L — ABNORMAL LOW (ref 5.7–26.3)

## 2024-03-27 LAB — MULTIPLE MYELOMA PANEL, SERUM
Albumin SerPl Elph-Mcnc: 3.8 g/dL (ref 2.9–4.4)
Albumin/Glob SerPl: 2.4 — ABNORMAL HIGH (ref 0.7–1.7)
Alpha 1: 0.2 g/dL (ref 0.0–0.4)
Alpha2 Glob SerPl Elph-Mcnc: 0.5 g/dL (ref 0.4–1.0)
B-Globulin SerPl Elph-Mcnc: 0.7 g/dL (ref 0.7–1.3)
Gamma Glob SerPl Elph-Mcnc: 0.2 g/dL — ABNORMAL LOW (ref 0.4–1.8)
Globulin, Total: 1.6 g/dL — ABNORMAL LOW (ref 2.2–3.9)
IgA: 7 mg/dL — ABNORMAL LOW (ref 61–437)
IgG (Immunoglobin G), Serum: 299 mg/dL — ABNORMAL LOW (ref 603–1613)
IgM (Immunoglobulin M), Srm: <5 mg/dL — ABNORMAL LOW (ref 15–143)
M Protein SerPl Elph-Mcnc: 0.1 g/dL — ABNORMAL HIGH
Total Protein ELP: 5.4 g/dL — ABNORMAL LOW (ref 6.0–8.5)

## 2024-04-06 ENCOUNTER — Encounter: Payer: Self-pay | Admitting: Hematology

## 2024-04-06 ENCOUNTER — Inpatient Hospital Stay

## 2024-04-06 VITALS — BP 114/68 | HR 63 | Temp 98.0°F | Resp 16 | Wt 175.8 lb

## 2024-04-06 DIAGNOSIS — C9000 Multiple myeloma not having achieved remission: Secondary | ICD-10-CM

## 2024-04-06 DIAGNOSIS — Z5112 Encounter for antineoplastic immunotherapy: Secondary | ICD-10-CM | POA: Diagnosis not present

## 2024-04-06 LAB — CBC WITH DIFFERENTIAL (CANCER CENTER ONLY)
Abs Immature Granulocytes: 0.02 K/uL (ref 0.00–0.07)
Basophils Absolute: 0.1 K/uL (ref 0.0–0.1)
Basophils Relative: 1 %
Eosinophils Absolute: 0.2 K/uL (ref 0.0–0.5)
Eosinophils Relative: 5 %
HCT: 34.3 % — ABNORMAL LOW (ref 39.0–52.0)
Hemoglobin: 11.7 g/dL — ABNORMAL LOW (ref 13.0–17.0)
Immature Granulocytes: 1 %
Lymphocytes Relative: 21 %
Lymphs Abs: 0.9 K/uL (ref 0.7–4.0)
MCH: 32.3 pg (ref 26.0–34.0)
MCHC: 34.1 g/dL (ref 30.0–36.0)
MCV: 94.8 fL (ref 80.0–100.0)
Monocytes Absolute: 0.5 K/uL (ref 0.1–1.0)
Monocytes Relative: 10 %
Neutro Abs: 2.7 K/uL (ref 1.7–7.7)
Neutrophils Relative %: 62 %
Platelet Count: 188 K/uL (ref 150–400)
RBC: 3.62 MIL/uL — ABNORMAL LOW (ref 4.22–5.81)
RDW: 14.4 % (ref 11.5–15.5)
WBC Count: 4.4 K/uL (ref 4.0–10.5)
nRBC: 0 % (ref 0.0–0.2)

## 2024-04-06 LAB — CMP (CANCER CENTER ONLY)
ALT: 19 U/L (ref 0–44)
AST: 17 U/L (ref 15–41)
Albumin: 4.1 g/dL (ref 3.5–5.0)
Alkaline Phosphatase: 41 U/L (ref 38–126)
Anion gap: 4 — ABNORMAL LOW (ref 5–15)
BUN: 19 mg/dL (ref 8–23)
CO2: 32 mmol/L (ref 22–32)
Calcium: 8.8 mg/dL — ABNORMAL LOW (ref 8.9–10.3)
Chloride: 107 mmol/L (ref 98–111)
Creatinine: 1.05 mg/dL (ref 0.61–1.24)
GFR, Estimated: >60 mL/min (ref >60)
Glucose, Bld: 123 mg/dL — ABNORMAL HIGH (ref 70–99)
Potassium: 4.1 mmol/L (ref 3.5–5.1)
Sodium: 143 mmol/L (ref 135–145)
Total Bilirubin: 0.7 mg/dL (ref 0.0–1.2)
Total Protein: 6 g/dL — ABNORMAL LOW (ref 6.5–8.1)

## 2024-04-06 MED ORDER — ACETAMINOPHEN 325 MG PO TABS
650.0000 mg | ORAL_TABLET | Freq: Once | ORAL | Status: AC
  Administered 2024-04-06: 650 mg via ORAL
  Filled 2024-04-06: qty 2

## 2024-04-06 MED ORDER — DARATUMUMAB-HYALURONIDASE-FIHJ 1800-30000 MG-UT/15ML ~~LOC~~ SOLN
1800.0000 mg | Freq: Once | SUBCUTANEOUS | Status: AC
  Administered 2024-04-06: 1800 mg via SUBCUTANEOUS
  Filled 2024-04-06: qty 15

## 2024-04-06 MED ORDER — DIPHENHYDRAMINE HCL 25 MG PO CAPS
50.0000 mg | ORAL_CAPSULE | Freq: Once | ORAL | Status: AC
  Administered 2024-04-06: 50 mg via ORAL
  Filled 2024-04-06: qty 2

## 2024-04-06 MED ORDER — DEXAMETHASONE 4 MG PO TABS
20.0000 mg | ORAL_TABLET | Freq: Once | ORAL | Status: AC
  Administered 2024-04-06: 20 mg via ORAL
  Filled 2024-04-06: qty 5

## 2024-04-06 NOTE — Patient Instructions (Signed)

## 2024-04-07 ENCOUNTER — Other Ambulatory Visit: Payer: Self-pay

## 2024-04-07 DIAGNOSIS — C9000 Multiple myeloma not having achieved remission: Secondary | ICD-10-CM

## 2024-04-07 LAB — KAPPA/LAMBDA LIGHT CHAINS
Kappa free light chain: 47.8 mg/L — ABNORMAL HIGH (ref 3.3–19.4)
Kappa, lambda light chain ratio: 19.12 — ABNORMAL HIGH (ref 0.26–1.65)
Lambda free light chains: 2.5 mg/L — ABNORMAL LOW (ref 5.7–26.3)

## 2024-04-07 MED ORDER — LENALIDOMIDE 15 MG PO CAPS: 15.0000 mg | ORAL_CAPSULE | Freq: Every day | ORAL | 0 refills | Status: DC

## 2024-04-08 LAB — MULTIPLE MYELOMA PANEL, SERUM
Albumin SerPl Elph-Mcnc: 3.7 g/dL (ref 2.9–4.4)
Albumin/Glob SerPl: 2.1 — ABNORMAL HIGH (ref 0.7–1.7)
Alpha 1: 0.2 g/dL (ref 0.0–0.4)
Alpha2 Glob SerPl Elph-Mcnc: 0.6 g/dL (ref 0.4–1.0)
B-Globulin SerPl Elph-Mcnc: 0.7 g/dL (ref 0.7–1.3)
Gamma Glob SerPl Elph-Mcnc: 0.3 g/dL — ABNORMAL LOW (ref 0.4–1.8)
Globulin, Total: 1.8 g/dL — ABNORMAL LOW (ref 2.2–3.9)
IgA: 6 mg/dL — ABNORMAL LOW (ref 61–437)
IgG (Immunoglobin G), Serum: 320 mg/dL — ABNORMAL LOW (ref 603–1613)
IgM (Immunoglobulin M), Srm: <5 mg/dL — ABNORMAL LOW (ref 15–143)
M Protein SerPl Elph-Mcnc: 0.1 g/dL — ABNORMAL HIGH
Total Protein ELP: 5.5 g/dL — ABNORMAL LOW (ref 6.0–8.5)

## 2024-04-12 ENCOUNTER — Encounter: Payer: Self-pay | Admitting: Hematology

## 2024-04-13 ENCOUNTER — Encounter: Payer: Self-pay | Admitting: Hematology

## 2024-04-13 DIAGNOSIS — H9192 Unspecified hearing loss, left ear: Secondary | ICD-10-CM | POA: Diagnosis not present

## 2024-04-13 DIAGNOSIS — Z6823 Body mass index (BMI) 23.0-23.9, adult: Secondary | ICD-10-CM | POA: Diagnosis not present

## 2024-04-14 ENCOUNTER — Other Ambulatory Visit: Payer: Self-pay

## 2024-04-15 ENCOUNTER — Ambulatory Visit (INDEPENDENT_AMBULATORY_CARE_PROVIDER_SITE_OTHER): Admitting: Otolaryngology

## 2024-04-15 ENCOUNTER — Ambulatory Visit (INDEPENDENT_AMBULATORY_CARE_PROVIDER_SITE_OTHER): Admitting: Audiology

## 2024-04-15 ENCOUNTER — Encounter (INDEPENDENT_AMBULATORY_CARE_PROVIDER_SITE_OTHER): Payer: Self-pay | Admitting: Otolaryngology

## 2024-04-15 VITALS — BP 125/73 | HR 55 | Ht 71.0 in | Wt 172.0 lb

## 2024-04-15 DIAGNOSIS — H90A32 Mixed conductive and sensorineural hearing loss, unilateral, left ear with restricted hearing on the contralateral side: Secondary | ICD-10-CM

## 2024-04-15 DIAGNOSIS — H903 Sensorineural hearing loss, bilateral: Secondary | ICD-10-CM

## 2024-04-15 DIAGNOSIS — H9312 Tinnitus, left ear: Secondary | ICD-10-CM | POA: Diagnosis not present

## 2024-04-15 DIAGNOSIS — H9012 Conductive hearing loss, unilateral, left ear, with unrestricted hearing on the contralateral side: Secondary | ICD-10-CM | POA: Diagnosis not present

## 2024-04-15 MED ORDER — PREDNISONE 10 MG (21) PO TBPK: ORAL_TABLET | ORAL | 0 refills | Status: AC

## 2024-04-15 NOTE — Progress Notes (Signed)
  [address], [address] [place], [place] [address] [phone]  Audiological Evaluation    Name: Colin Calderon     DOB:   1951/06/07      MRN:   [id number]                                                                                     Service Date: 04/15/2024     Accompanied by: unaccompanied   Patient comes today after Dr. Karis, ENT sent a referral for a hearing evaluation due to concerns with sudden hearing loss.   Symptoms Yes Details  Hearing loss  [x]  On Saturday  had sudden left ear hearing change - could not hear anything and today still perceives a hearing difference but not as bad as it was on Saturday  Tinnitus  [x]  Reports a low buzzing in the left ear since Saturday  Ear pain/ infections/pressure  []    Balance problems  []    Noise exposure history  []    Previous ear surgeries  []    Family history of hearing loss  [x]  Father with age  Amplification  []    Other  [x]  Patient is getting oncology treatments for multiple myeloma    Otoscopy: Right ear: Clear external ear canal and notable landmarks visualized on the tympanic membrane. Left ear:  Clear external ear canal and notable landmarks visualized on the tympanic membrane.  Tympanometry: Right ear: Type A- Normal external ear canal volume with normal middle ear pressure and tympanic membrane compliance. Left ear: Type A- Normal external ear canal volume with normal middle ear pressure and tympanic membrane compliance.   Pure tone Audiometry: Right ear- Normal hearing from [phone] Hz, except for a mild to moderate sensorineural hearing loss from 4000 Hz - 6000 Hz. Left ear-  Normal hearing from [phone] Hz, then moderate sensorineural hearing loss from 3000 Hz - 8000 Hz.  Speech Audiometry: Right ear- Speech Reception Threshold (SRT) was obtained at 10 dBHL. Left ear-Speech Reception Threshold (SRT) was obtained at 15 dBHL.   Word Recognition Score Tested using NU-6 (recorded) Right ear: 100% was  obtained at a presentation level of 55 dBHL with contralateral masking which is deemed as  excellent. Left ear: 100% was obtained at a presentation level of 55 dBHL with contralateral masking which is deemed as  excellent.   The hearing test results were completed under headphones and re-checked with inserts and results are deemed to be of good reliability. Test technique:  conventional    Impression: Left hearing asymmetry in the hight frequencies noted.  Recommendations: Follow up with ENT as scheduled for today. Repeat audiogram in conjunction with medical care.   Devoiry Corriher MARIE LEROUX-MARTINEZ, AUD

## 2024-04-17 DIAGNOSIS — H9072 Mixed conductive and sensorineural hearing loss, unilateral, left ear, with unrestricted hearing on the contralateral side: Secondary | ICD-10-CM | POA: Insufficient documentation

## 2024-04-17 DIAGNOSIS — H9312 Tinnitus, left ear: Secondary | ICD-10-CM | POA: Insufficient documentation

## 2024-04-17 NOTE — Progress Notes (Signed)
 CC: Left ear hearing loss, left ear tinnitus  HPI:  Colin Calderon is a 73 y.o. male who presents today complaining of left ear hearing loss and left ear tinnitus.  He first noted the hearing loss and tinnitus 4 days ago.  It was abrupt in onset.  He describes the tinnitus as a constant buzzing noise.  He denies any otalgia, otorrhea, or vertigo.  His hearing has gradually improved.  He has a history of multiple myeloma, and is receiving injection medication every 2 weeks.  He has no previous otologic surgery.  He denies any recent otitis media or otitis externa.  History reviewed. No pertinent past medical history.  Past Surgical History:  Procedure Laterality Date   HERNIA REPAIR      History reviewed. No pertinent family history.  Social History:  reports that he has never smoked. He does not have any smokeless tobacco history on file. He reports current alcohol use. He reports that he does not use drugs.  Allergies:  Allergies  Allergen Reactions   Amoxicillin Itching and Other (See Comments)    Prior to Admission medications   Medication Sig Start Date End Date Taking? Authorizing Provider  acyclovir  (ZOVIRAX ) 400 MG tablet Take 1 tablet (400 mg total) by mouth 2 (two) times daily. 10/24/23  Yes [name], MD  aspirin  EC 81 MG tablet Take 1 tablet (81 mg total) by mouth daily. 10/24/23  Yes [name], MD  Cyanocobalamin  5000 MCG/ML LIQD 1 tablet 1 day or 1 dose.   Yes [provider]  lenalidomide  (REVLIMID ) 15 MG capsule Take 1 capsule (15 mg total) by mouth daily. Take 1 capsule (15 mg total) by mouth daily for 21 days. Take 7 days off and repeat cycle. 04/07/24  Yes [name], MD  ondansetron  (ZOFRAN ) 8 MG tablet Take 1 tablet (8 mg total) by mouth every 8 (eight) hours as needed for nausea or vomiting. 10/24/23  Yes [name], MD  predniSONE  (STERAPRED UNI-PAK 21 TAB) 10 MG (21) TBPK tablet Per instructions (6,5,4,3,2,1) 04/15/24   Yes Karis Clunes, MD  prochlorperazine  (COMPAZINE ) 10 MG tablet Take 1 tablet (10 mg total) by mouth every 6 (six) hours as needed for nausea or vomiting. 10/24/23  Yes [name], MD    Blood pressure 125/73, pulse (!) 55, height 5' 11 (1.803 m), weight 172 lb (78 kg), SpO2 98%. Exam: General: Communicates without difficulty, well nourished, no acute distress. Head: Normocephalic, no evidence injury, no tenderness, facial buttresses intact without stepoff. Face/sinus: No tenderness to palpation and percussion. Facial movement is normal and symmetric. Eyes: PERRL, EOMI. No scleral icterus, conjunctivae clear. Neuro: CN II exam reveals vision grossly intact.  No nystagmus at any point of gaze. Ears: Auricles well formed without lesions.  Ear canals are intact without mass or lesion.  No erythema or edema is appreciated.  The TMs are intact without fluid. Nose: External evaluation reveals normal support and skin without lesions.  Dorsum is intact.  Anterior rhinoscopy reveals congested mucosa over anterior aspect of inferior turbinates and intact septum.  No purulence noted. Oral:  Oral cavity and oropharynx are intact, symmetric, without erythema or edema.  Mucosa is moist without lesions. Neck: Full range of motion without pain.  There is no significant lymphadenopathy.  No masses palpable.  Thyroid  bed within normal limits to palpation.  Parotid glands and submandibular glands equal bilaterally without mass.  Trachea is midline. Neuro:  CN 2-12 grossly intact.  His hearing test shows bilateral mild high-frequency sensorineural hearing loss, with slight conductive component noted on the left side.  Assessment: 1.  Recent acute sudden left ear hearing loss and left ear tinnitus.  His hearing loss has spontaneously improved. 2.  Only slight conductive hearing loss is noted on the left side.  He also has bilateral mild high-frequency sensorineural hearing loss. 3.  His ear canals, tympanic  membranes, and middle ear spaces are noted to be normal.  No middle ear effusion or infection is noted.  Plan: 1.  The physical exam findings and the hearing test results are reviewed with the patient. 2.  The patient is reassured that no acute infection or middle ear effusion is noted. 3.  Prednisone  Dosepak for 6 days. 4.  Valsalva exercises and Flonase nasal spray. 5.  The patient will return for reevaluation in 1 month, sooner if his hearing loss and tinnitus worsen.  Lonni Dirden W Jacey Pelc 04/17/2024, 7:52 AM

## 2024-04-20 NOTE — Progress Notes (Signed)
 03/24/24 FU with Johnston and cycle 5 infusion. Will continue with q 15 days infusions and FU's as scheduled.

## 2024-04-21 ENCOUNTER — Ambulatory Visit: Admitting: Hematology

## 2024-04-21 ENCOUNTER — Ambulatory Visit

## 2024-04-21 ENCOUNTER — Other Ambulatory Visit

## 2024-04-28 ENCOUNTER — Encounter: Payer: Self-pay | Admitting: Audiology

## 2024-04-30 DIAGNOSIS — R7301 Impaired fasting glucose: Secondary | ICD-10-CM | POA: Diagnosis not present

## 2024-04-30 DIAGNOSIS — Z8349 Family history of other endocrine, nutritional and metabolic diseases: Secondary | ICD-10-CM | POA: Diagnosis not present

## 2024-04-30 DIAGNOSIS — D649 Anemia, unspecified: Secondary | ICD-10-CM | POA: Diagnosis not present

## 2024-04-30 DIAGNOSIS — Z1331 Encounter for screening for depression: Secondary | ICD-10-CM | POA: Diagnosis not present

## 2024-04-30 DIAGNOSIS — Z125 Encounter for screening for malignant neoplasm of prostate: Secondary | ICD-10-CM | POA: Diagnosis not present

## 2024-04-30 DIAGNOSIS — E538 Deficiency of other specified B group vitamins: Secondary | ICD-10-CM | POA: Diagnosis not present

## 2024-04-30 DIAGNOSIS — Z0000 Encounter for general adult medical examination without abnormal findings: Secondary | ICD-10-CM | POA: Diagnosis not present

## 2024-04-30 DIAGNOSIS — I700 Atherosclerosis of aorta: Secondary | ICD-10-CM | POA: Diagnosis not present

## 2024-05-01 ENCOUNTER — Ambulatory Visit

## 2024-05-01 ENCOUNTER — Inpatient Hospital Stay (HOSPITAL_BASED_OUTPATIENT_CLINIC_OR_DEPARTMENT_OTHER): Admitting: Hematology

## 2024-05-01 ENCOUNTER — Inpatient Hospital Stay: Attending: Oncology

## 2024-05-01 VITALS — BP 105/57 | HR 98 | Temp 97.5°F | Resp 20 | Wt 174.1 lb

## 2024-05-01 DIAGNOSIS — C9000 Multiple myeloma not having achieved remission: Secondary | ICD-10-CM | POA: Diagnosis not present

## 2024-05-01 DIAGNOSIS — Z5111 Encounter for antineoplastic chemotherapy: Secondary | ICD-10-CM

## 2024-05-01 DIAGNOSIS — Z5112 Encounter for antineoplastic immunotherapy: Secondary | ICD-10-CM | POA: Insufficient documentation

## 2024-05-01 DIAGNOSIS — Z79899 Other long term (current) drug therapy: Secondary | ICD-10-CM | POA: Insufficient documentation

## 2024-05-01 LAB — CMP (CANCER CENTER ONLY)
ALT: 14 U/L (ref 0–44)
AST: 12 U/L — ABNORMAL LOW (ref 15–41)
Albumin: 4.1 g/dL (ref 3.5–5.0)
Alkaline Phosphatase: 45 U/L (ref 38–126)
Anion gap: 4 — ABNORMAL LOW (ref 5–15)
BUN: 16 mg/dL (ref 8–23)
CO2: 31 mmol/L (ref 22–32)
Calcium: 8.6 mg/dL — ABNORMAL LOW (ref 8.9–10.3)
Chloride: 108 mmol/L (ref 98–111)
Creatinine: 0.88 mg/dL (ref 0.61–1.24)
GFR, Estimated: >60 mL/min (ref >60)
Glucose, Bld: 112 mg/dL — ABNORMAL HIGH (ref 70–99)
Potassium: 4.2 mmol/L (ref 3.5–5.1)
Sodium: 143 mmol/L (ref 135–145)
Total Bilirubin: 1 mg/dL (ref 0.0–1.2)
Total Protein: 5.6 g/dL — ABNORMAL LOW (ref 6.5–8.1)

## 2024-05-01 LAB — CBC WITH DIFFERENTIAL (CANCER CENTER ONLY)
Abs Immature Granulocytes: 0 K/uL (ref 0.00–0.07)
Basophils Absolute: 0.1 K/uL (ref 0.0–0.1)
Basophils Relative: 2 %
Eosinophils Absolute: 0.3 K/uL (ref 0.0–0.5)
Eosinophils Relative: 8 %
HCT: 35.4 % — ABNORMAL LOW (ref 39.0–52.0)
Hemoglobin: 12.1 g/dL — ABNORMAL LOW (ref 13.0–17.0)
Immature Granulocytes: 0 %
Lymphocytes Relative: 34 %
Lymphs Abs: 1.2 K/uL (ref 0.7–4.0)
MCH: 32 pg (ref 26.0–34.0)
MCHC: 34.2 g/dL (ref 30.0–36.0)
MCV: 93.7 fL (ref 80.0–100.0)
Monocytes Absolute: 0.4 K/uL (ref 0.1–1.0)
Monocytes Relative: 12 %
Neutro Abs: 1.6 K/uL — ABNORMAL LOW (ref 1.7–7.7)
Neutrophils Relative %: 44 %
Platelet Count: 210 K/uL (ref 150–400)
RBC: 3.78 MIL/uL — ABNORMAL LOW (ref 4.22–5.81)
RDW: 14.3 % (ref 11.5–15.5)
WBC Count: 3.4 K/uL — ABNORMAL LOW (ref 4.0–10.5)
nRBC: 0 % (ref 0.0–0.2)

## 2024-05-01 MED ORDER — ACETAMINOPHEN 325 MG PO TABS
650.0000 mg | ORAL_TABLET | Freq: Once | ORAL | Status: AC
  Administered 2024-05-01: 650 mg via ORAL
  Filled 2024-05-01: qty 2

## 2024-05-01 MED ORDER — DEXAMETHASONE 4 MG PO TABS
20.0000 mg | ORAL_TABLET | Freq: Once | ORAL | Status: AC
  Administered 2024-05-01: 20 mg via ORAL
  Filled 2024-05-01: qty 5

## 2024-05-01 MED ORDER — DIPHENHYDRAMINE HCL 25 MG PO CAPS
50.0000 mg | ORAL_CAPSULE | Freq: Once | ORAL | Status: AC
  Administered 2024-05-01: 50 mg via ORAL
  Filled 2024-05-01: qty 2

## 2024-05-01 MED ORDER — DARATUMUMAB-HYALURONIDASE-FIHJ 1800-30000 MG-UT/15ML ~~LOC~~ SOLN
1800.0000 mg | Freq: Once | SUBCUTANEOUS | Status: AC
  Administered 2024-05-01: 1800 mg via SUBCUTANEOUS
  Filled 2024-05-01: qty 15

## 2024-05-01 NOTE — Patient Instructions (Signed)

## 2024-05-04 DIAGNOSIS — Z6824 Body mass index (BMI) 24.0-24.9, adult: Secondary | ICD-10-CM | POA: Diagnosis not present

## 2024-05-04 DIAGNOSIS — Z0000 Encounter for general adult medical examination without abnormal findings: Secondary | ICD-10-CM | POA: Diagnosis not present

## 2024-05-04 DIAGNOSIS — R972 Elevated prostate specific antigen [PSA]: Secondary | ICD-10-CM | POA: Diagnosis not present

## 2024-05-05 ENCOUNTER — Other Ambulatory Visit: Payer: Self-pay | Admitting: Hematology

## 2024-05-05 DIAGNOSIS — C9000 Multiple myeloma not having achieved remission: Secondary | ICD-10-CM | POA: Diagnosis not present

## 2024-05-06 ENCOUNTER — Other Ambulatory Visit: Payer: Self-pay

## 2024-05-06 DIAGNOSIS — C9000 Multiple myeloma not having achieved remission: Secondary | ICD-10-CM

## 2024-05-06 DIAGNOSIS — H2513 Age-related nuclear cataract, bilateral: Secondary | ICD-10-CM | POA: Diagnosis not present

## 2024-05-06 DIAGNOSIS — H5213 Myopia, bilateral: Secondary | ICD-10-CM | POA: Diagnosis not present

## 2024-05-06 MED ORDER — LENALIDOMIDE 15 MG PO CAPS: 15.0000 mg | ORAL_CAPSULE | Freq: Every day | ORAL | 0 refills | Status: DC

## 2024-05-08 ENCOUNTER — Encounter: Payer: Self-pay | Admitting: Hematology

## 2024-05-08 NOTE — Progress Notes (Signed)
 HEMATOLOGY/ONCOLOGY CLINIC NOTE  Date of Service: .05/01/2024   Patient Care Team: [name], MD as PCP - General (Hematology) Huey, [name], MD as Referring Physician (Neurology)  CHIEF COMPLAINTS/PURPOSE OF CONSULTATION:  F/u for evaluation and management of multiple myeloma  INTERVAL HISTORY:  Colin Calderon is here for continued valuation and management of his multiple myeloma and neck cycle of Dara/rev/Dex. He notes no acute new focal bone pains, fevers chills or night sweats. Tolerating treatment well without any acute new symptoms. He has been tolerating the Revlimid  at 15 mg 3 weeks on 1 week off now without any significant neutropenia. No new leg pain or swelling.  No new chest pain or shortness of breath.. No other notable toxicities from his current treatment..  Still having issues with his carpal tunnel syndrome but these are stable.  No plans for surgery for this currently.. Also having issues inguinal hernia and did see his surgeon.  Has been using a truss and has no issues with a reducible hernia or pain at this time. We discussed the limitations in terms of taking a break of treatment for her his hernia surgery and the risks involved in myeloma progression, infection, increased risk of blood clots etc..  He has a follow-up with Dr. Fernande at [facility] on 05/05/2024 for transplant considerations. No other acute new symptoms at this time.    MEDICAL HISTORY:  No past medical history on file.  SURGICAL HISTORY: Past Surgical History:  Procedure Laterality Date   HERNIA REPAIR      SOCIAL HISTORY: Social History   Socioeconomic History   Marital status: Married    Spouse name: Not on file   Number of children: Not on file   Years of education: Not on file   Highest education level: Not on file  Occupational History   Not on file  Tobacco Use   Smoking status: Never   Smokeless tobacco: Not on file  Substance and Sexual  Activity   Alcohol use: Yes   Drug use: No   Sexual activity: Not on file  Other Topics Concern   Not on file  Social History Narrative   Not on file   Social Drivers of Health   Financial Resource Strain: Low Risk  (07/01/2023)   Overall Financial Resource Strain (CARDIA)    Difficulty of Paying Living Expenses: Not hard at all  Food Insecurity: Low Risk  (10/16/2023)   Received from [facility]   Hunger Vital Sign    Within the past 12 months, you worried that your food would run out before you got money to buy more: Never true    Within the past 12 months, the food you bought just didn't last and you didn't have money to get more. : Never true  Transportation Needs: No Transportation Needs (10/16/2023)   Received from [employer]    In the past 12 months, has lack of reliable transportation kept you from medical appointments, meetings, work or from getting things needed for daily living? : No  Physical Activity: Sufficiently Active (07/01/2023)   Exercise Vital Sign    Days of Exercise per Week: 7 days    Minutes of Exercise per Session: 40 min  Stress: No Stress Concern Present (07/01/2023)   [employer] of Occupational Health - Occupational Stress Questionnaire    Feeling of Stress : Not at all  Social Connections: Socially Integrated (07/01/2023)   Social Connection and Isolation Panel  Frequency of Communication with Friends and Family: Not on file    Frequency of Social Gatherings with Friends and Family: Three times a week    Attends Religious Services: More than 4 times per year    Active Member of Clubs or Organizations: Yes    Attends Banker Meetings: More than 4 times per year    Marital Status: Married  Catering manager Violence: Not At Risk (07/01/2023)   Humiliation, Afraid, Rape, and Kick questionnaire    Fear of Current or Ex-Partner: No    Emotionally Abused: No    Physically Abused: No    Sexually Abused: No     FAMILY HISTORY: No family history on file.  ALLERGIES:  is allergic to amoxicillin.  MEDICATIONS:  Current Outpatient Medications  Medication Sig Dispense Refill   aspirin  EC 81 MG tablet Take 1 tablet (81 mg total) by mouth daily. 100 tablet 3   Cyanocobalamin  5000 MCG/ML LIQD 1 tablet 1 day or 1 dose.     ondansetron  (ZOFRAN ) 8 MG tablet Take 1 tablet (8 mg total) by mouth every 8 (eight) hours as needed for nausea or vomiting. 30 tablet 1   predniSONE  (STERAPRED UNI-PAK 21 TAB) 10 MG (21) TBPK tablet Per instructions (6,5,4,3,2,1) 21 tablet 0   prochlorperazine  (COMPAZINE ) 10 MG tablet Take 1 tablet (10 mg total) by mouth every 6 (six) hours as needed for nausea or vomiting. 30 tablet 1   acyclovir  (ZOVIRAX ) 400 MG tablet Take 1 tablet (400 mg total) by mouth 2 (two) times daily. 60 tablet 5   lenalidomide  (REVLIMID ) 15 MG capsule Take 1 capsule (15 mg total) by mouth daily. Take 1 capsule (15 mg total) by mouth daily for 21 days. Take 7 days off and repeat cycle. 21 capsule 0   No current facility-administered medications for this visit.    REVIEW OF SYSTEMS:   .10 Point review of Systems was done is negative except as noted above.  PHYSICAL EXAMINATION: ECOG PERFORMANCE STATUS: 1 - Symptomatic but completely ambulatory   Vitals:   05/01/24 1027  BP: (!) 105/57  Pulse: 98  Resp: 20  Temp: (!) 97.5 F (36.4 C)  SpO2: 98%    Filed Weights   05/01/24 1027  Weight: 174 lb 1.6 oz (79 kg)   .Body mass index is 24.28 kg/m.   GENERAL:alert, in no acute distress and comfortable SKIN: no acute rashes, no significant lesions EYES: conjunctiva are pink and non-injected, sclera anicteric LUNGS: clear to auscultation b/l with normal respiratory effort HEART: regular rate & rhythm Extremity: no pedal edema PSYCH: alert & oriented x 3 with fluent speech NEURO: no focal motor/sensory deficits   LABORATORY DATA:  I have reviewed the data as listed .    Latest Ref Rng  & Units 05/01/2024    9:54 AM 04/06/2024    9:05 AM 03/24/2024    8:05 AM  CBC  WBC 4.0 - 10.5 K/uL 3.4  4.4  3.5   Hemoglobin 13.0 - 17.0 g/dL 87.8  88.2  88.2   Hematocrit 39.0 - 52.0 % 35.4  34.3  34.2   Platelets 150 - 400 K/uL 210  188  198    ANC 1.6k  .    Latest Ref Rng & Units 05/01/2024    9:54 AM 04/06/2024    9:05 AM 03/24/2024    8:05 AM  CMP  Glucose 70 - 99 mg/dL 887  876  884   BUN 8 - 23 mg/dL 16  19  16   Creatinine 0.61 - 1.24 mg/dL 9.11  8.94  9.01   Sodium 135 - 145 mmol/L 143  143  144   Potassium 3.5 - 5.1 mmol/L 4.2  4.1  4.3   Chloride 98 - 111 mmol/L 108  107  111   CO2 22 - 32 mmol/L 31  32  29   Calcium 8.9 - 10.3 mg/dL 8.6  8.8  8.8   Total Protein 6.5 - 8.1 g/dL 5.6  6.0  5.5   Total Bilirubin 0.0 - 1.2 mg/dL 1.0  0.7  0.5   Alkaline Phos 38 - 126 U/L 45  41  37   AST 15 - 41 U/L 12  17  18    ALT 0 - 44 U/L 14  19  19       07/17/2023 Surgical pathology:   07/17/2023 Surgical pathology:      RADIOGRAPHIC STUDIES: I have personally reviewed the radiological images as listed and agreed with the findings in the report. No results found.   ASSESSMENT & PLAN:  Colin Calderon is a 73 y.o. male who returns for a follow up for multiple meyloma.   #IgG Lambda  Multiple myeloma -Kappa light chain producing. -BMBx from 07/17/2023 showed two plasmacytoid populations, one kappa and one lambda restricted. The predominant clone is CD20 positive and CD5 and CD10 negative.  -Patient started Dara/Rev/Dex on 11/07/2023.  -Revlimid  was dose reduced from 25 mg to 15 mg on 12/23/2023 due to cytopenias.  PLAN: - Labs done today were discussed in detail with the patient - CBC shows improving anemia, stable WBC counts and platelets CMP stable Myeloma labs show decreasing light chains with good response to treatment. He appears to be in VGPR and she will have a discussion with Dr. Fernande at [facility] regarding his consideration of transplant. We shall  continue DRD at this time with a plan to consider dose increase at his Revlimid  if needed/tolerated and possible addition of PI down the line if needed. - Continue aspirin  81 mg p.o. 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  FOLLOW-UP: Per Integrated scheduling MD visit in 4 weeks  The total time spent in the appointment was 30 minutes*.  All of the patient's questions were answered with apparent satisfaction. The patient knows to call the clinic with any problems, questions or concerns.   Emaline Saran MD MS AAHIVMS [facility] Hematology/Oncology Physician [facility]  .*Total Encounter Time as defined by the Centers for Medicare and Medicaid Services includes, in addition to the face-to-face time of a patient visit (documented in the note above) non-face-to-face time: obtaining and reviewing outside history, ordering and reviewing medications, tests or procedures, care coordination (communications with other health care professionals or caregivers) and documentation in the medical record.

## 2024-05-15 ENCOUNTER — Inpatient Hospital Stay: Attending: Oncology

## 2024-05-15 ENCOUNTER — Inpatient Hospital Stay

## 2024-05-15 VITALS — BP 107/66 | HR 56 | Temp 97.7°F | Resp 17 | Wt 176.2 lb

## 2024-05-15 DIAGNOSIS — Z79899 Other long term (current) drug therapy: Secondary | ICD-10-CM | POA: Diagnosis not present

## 2024-05-15 DIAGNOSIS — C9000 Multiple myeloma not having achieved remission: Secondary | ICD-10-CM | POA: Diagnosis not present

## 2024-05-15 DIAGNOSIS — Z5112 Encounter for antineoplastic immunotherapy: Secondary | ICD-10-CM | POA: Diagnosis not present

## 2024-05-15 LAB — CMP (CANCER CENTER ONLY)
ALT: 15 U/L (ref 0–44)
AST: 12 U/L — ABNORMAL LOW (ref 15–41)
Albumin: 4.1 g/dL (ref 3.5–5.0)
Alkaline Phosphatase: 37 U/L — ABNORMAL LOW (ref 38–126)
Anion gap: 3 — ABNORMAL LOW (ref 5–15)
BUN: 19 mg/dL (ref 8–23)
CO2: 30 mmol/L (ref 22–32)
Calcium: 8.4 mg/dL — ABNORMAL LOW (ref 8.9–10.3)
Chloride: 110 mmol/L (ref 98–111)
Creatinine: 0.98 mg/dL (ref 0.61–1.24)
GFR, Estimated: >60 mL/min (ref >60)
Glucose, Bld: 101 mg/dL — ABNORMAL HIGH (ref 70–99)
Potassium: 4.2 mmol/L (ref 3.5–5.1)
Sodium: 143 mmol/L (ref 135–145)
Total Bilirubin: 0.6 mg/dL (ref 0.0–1.2)
Total Protein: 5.6 g/dL — ABNORMAL LOW (ref 6.5–8.1)

## 2024-05-15 LAB — CBC WITH DIFFERENTIAL (CANCER CENTER ONLY)
Abs Immature Granulocytes: 0 K/uL (ref 0.00–0.07)
Basophils Absolute: 0.1 K/uL (ref 0.0–0.1)
Basophils Relative: 3 %
Eosinophils Absolute: 0.3 K/uL (ref 0.0–0.5)
Eosinophils Relative: 9 %
HCT: 33.8 % — ABNORMAL LOW (ref 39.0–52.0)
Hemoglobin: 11.5 g/dL — ABNORMAL LOW (ref 13.0–17.0)
Immature Granulocytes: 0 %
Lymphocytes Relative: 43 %
Lymphs Abs: 1.3 K/uL (ref 0.7–4.0)
MCH: 32.1 pg (ref 26.0–34.0)
MCHC: 34 g/dL (ref 30.0–36.0)
MCV: 94.4 fL (ref 80.0–100.0)
Monocytes Absolute: 0.4 K/uL (ref 0.1–1.0)
Monocytes Relative: 12 %
Neutro Abs: 1 K/uL — ABNORMAL LOW (ref 1.7–7.7)
Neutrophils Relative %: 33 %
Platelet Count: 182 K/uL (ref 150–400)
RBC: 3.58 MIL/uL — ABNORMAL LOW (ref 4.22–5.81)
RDW: 14.6 % (ref 11.5–15.5)
WBC Count: 2.9 K/uL — ABNORMAL LOW (ref 4.0–10.5)
nRBC: 0 % (ref 0.0–0.2)

## 2024-05-15 MED ORDER — DARATUMUMAB-HYALURONIDASE-FIHJ 1800-30000 MG-UT/15ML ~~LOC~~ SOLN
1800.0000 mg | Freq: Once | SUBCUTANEOUS | Status: AC
  Administered 2024-05-15: 1800 mg via SUBCUTANEOUS
  Filled 2024-05-15: qty 15

## 2024-05-15 MED ORDER — DIPHENHYDRAMINE HCL 25 MG PO CAPS
50.0000 mg | ORAL_CAPSULE | Freq: Once | ORAL | Status: AC
  Administered 2024-05-15: 50 mg via ORAL
  Filled 2024-05-15: qty 2

## 2024-05-15 MED ORDER — ACETAMINOPHEN 325 MG PO TABS
650.0000 mg | ORAL_TABLET | Freq: Once | ORAL | Status: AC
Start: 2024-05-15 — End: 2024-05-15
  Administered 2024-05-15: 650 mg via ORAL
  Filled 2024-05-15: qty 2

## 2024-05-15 MED ORDER — DEXAMETHASONE 4 MG PO TABS
20.0000 mg | ORAL_TABLET | Freq: Once | ORAL | Status: AC
Start: 2024-05-15 — End: 2024-05-15
  Administered 2024-05-15: 20 mg via ORAL
  Filled 2024-05-15: qty 5

## 2024-05-15 NOTE — Progress Notes (Signed)
 Patient not seen by MD today  Vitals are within treatment parameters.  Labs reviewed: and are not all within treatment parameters.  ANC 1.0/ Dr Onesimo aware  Per physician team, patient is ready for treatment and there are NO modifications to the treatment plan.

## 2024-05-15 NOTE — Patient Instructions (Signed)
 [facility] - A DEPT OF [facility]  Discharge Instructions: Thank you for choosing [facility] to provide your oncology and hematology care.   If you have a lab appointment with the Cancer Center, please go directly to the Cancer Center and check in at the registration area.   Wear comfortable clothing and clothing appropriate for easy access to any Portacath or PICC line.   We strive to give you quality time with your provider. You may need to reschedule your appointment if you arrive late (15 or more minutes).  Arriving late affects you and other patients whose appointments are after yours.  Also, if you miss three or more appointments without notifying the office, you may be dismissed from the clinic at the provider's discretion.      For prescription refill requests, have your pharmacy contact our office and allow 72 hours for refills to be completed.    Today you received the following chemotherapy and/or immunotherapy agents: Daratumumab -hyaluronidase -fihj (Darzalex  faspro)    To help prevent nausea and vomiting after your treatment, we encourage you to take your nausea medication as directed.  BELOW ARE SYMPTOMS THAT SHOULD BE REPORTED IMMEDIATELY: *FEVER GREATER THAN 100.4 F (38 C) OR HIGHER *CHILLS OR SWEATING *NAUSEA AND VOMITING THAT IS NOT CONTROLLED WITH YOUR NAUSEA MEDICATION *UNUSUAL SHORTNESS OF BREATH *UNUSUAL BRUISING OR BLEEDING *URINARY PROBLEMS (pain or burning when urinating, or frequent urination) *BOWEL PROBLEMS (unusual diarrhea, constipation, pain near the anus) TENDERNESS IN MOUTH AND THROAT WITH OR WITHOUT PRESENCE OF ULCERS (sore throat, sores in mouth, or a toothache) UNUSUAL RASH, SWELLING OR PAIN  UNUSUAL VAGINAL DISCHARGE OR ITCHING   Items with * indicate a potential emergency and should be followed up as soon as possible or go to the Emergency Department if any problems should occur.  Please show the  CHEMOTHERAPY ALERT CARD or IMMUNOTHERAPY ALERT CARD at check-in to the Emergency Department and triage nurse.  Should you have questions after your visit or need to cancel or reschedule your appointment, please contact [facility] - A DEPT OF [facility]  Dept: [phone]  and follow the prompts.  Office hours are 8:00 a.m. to 4:30 p.m. Monday - Friday. Please note that voicemails left after 4:00 p.m. may not be returned until the following business day.  We are closed weekends and major holidays. You have access to a nurse at all times for urgent questions. Please call the main number to the clinic Dept: [phone] and follow the prompts.   For any non-urgent questions, you may also contact your provider using MyChart. We now offer e-Visits for anyone 46 and older to request care online for non-urgent symptoms. For details visit [email or web address].   Also download the MyChart app! Go to the app store, search MyChart, open the app, select [place], and log in with your MyChart username and password.

## 2024-05-18 LAB — KAPPA/LAMBDA LIGHT CHAINS
Kappa free light chain: 35.6 mg/L — ABNORMAL HIGH (ref 3.3–19.4)
Kappa, lambda light chain ratio: 15.48 — ABNORMAL HIGH (ref 0.26–1.65)
Lambda free light chains: 2.3 mg/L — ABNORMAL LOW (ref 5.7–26.3)

## 2024-05-21 LAB — MULTIPLE MYELOMA PANEL, SERUM
Albumin SerPl Elph-Mcnc: 3.7 g/dL (ref 2.9–4.4)
Albumin/Glob SerPl: 2.2 — ABNORMAL HIGH (ref 0.7–1.7)
Alpha 1: 0.2 g/dL (ref 0.0–0.4)
Alpha2 Glob SerPl Elph-Mcnc: 0.5 g/dL (ref 0.4–1.0)
B-Globulin SerPl Elph-Mcnc: 0.7 g/dL (ref 0.7–1.3)
Gamma Glob SerPl Elph-Mcnc: 0.3 g/dL — ABNORMAL LOW (ref 0.4–1.8)
Globulin, Total: 1.7 g/dL — ABNORMAL LOW (ref 2.2–3.9)
IgA: 7 mg/dL — ABNORMAL LOW (ref 61–437)
IgG (Immunoglobin G), Serum: 319 mg/dL — ABNORMAL LOW (ref 603–1613)
IgM (Immunoglobulin M), Srm: <5 mg/dL — ABNORMAL LOW (ref 15–143)
M Protein SerPl Elph-Mcnc: 0.1 g/dL — ABNORMAL HIGH
Total Protein ELP: 5.4 g/dL — ABNORMAL LOW (ref 6.0–8.5)

## 2024-05-27 NOTE — Progress Notes (Signed)
 05/05/24 Patient was seen at [facility] for evaluation and assessment for bone marrow transplant. Advised to continue DRD at this time. 05/12/24 [facility] transplant education appointment.  05/15/24 Chemotherapy treatment at [facility]. Will continue to monitor.

## 2024-05-28 DIAGNOSIS — R972 Elevated prostate specific antigen [PSA]: Secondary | ICD-10-CM | POA: Diagnosis not present

## 2024-05-28 NOTE — Progress Notes (Unsigned)
 HEMATOLOGY/ONCOLOGY CLINIC NOTE  Date of Service: 05/29/24  Patient Care Team: [name], MD as PCP - General (Hematology) Huey, [name], MD as Referring Physician (Neurology)  CHIEF COMPLAINTS/PURPOSE OF CONSULTATION:  F/u for evaluation and management of multiple myeloma  INTERVAL HISTORY:  Colin Calderon returns for follow-up for follow-up for Cycle 7, Day 15 of Dara/Rev/Dex. He was last seen by Dr. Onesimo on 05/01/2024. In the interim, he continued on Dara/Rev/Dex therapy.  Colin Calderon reports he is tolerating his treatment without any new or concerning symptoms.  He did have injection site bruising after the last treatment which has improved.  He reports his energy and appetite are unchanged.  He denies nausea, vomiting or bowel habit changes.  He denies easy bruising or signs of active bleeding.  Patient denies fevers, chills, night sweats, shortness of breath, chest pain, cough.  He has no other complaints.    MEDICAL HISTORY:  No past medical history on file.  SURGICAL HISTORY: Past Surgical History:  Procedure Laterality Date   HERNIA REPAIR      SOCIAL HISTORY: Social History   Socioeconomic History   Marital status: Married    Spouse name: Not on file   Number of children: Not on file   Years of education: Not on file   Highest education level: Not on file  Occupational History   Not on file  Tobacco Use   Smoking status: Never   Smokeless tobacco: Not on file  Substance and Sexual Activity   Alcohol use: Yes   Drug use: No   Sexual activity: Not on file  Other Topics Concern   Not on file  Social History Narrative   Not on file   Social Drivers of Health   Financial Resource Strain: Low Risk  (07/01/2023)   Overall Financial Resource Strain (CARDIA)    Difficulty of Paying Living Expenses: Not hard at all  Food Insecurity: Low Risk  (10/16/2023)   Received from [facility]   Hunger Vital Sign    Within the past 12 months,  you worried that your food would run out before you got money to buy more: Never true    Within the past 12 months, the food you bought just didn't last and you didn't have money to get more. : Never true  Transportation Needs: No Transportation Needs (10/16/2023)   Received from [employer]    In the past 12 months, has lack of reliable transportation kept you from medical appointments, meetings, work or from getting things needed for daily living? : No  Physical Activity: Sufficiently Active (07/01/2023)   Exercise Vital Sign    Days of Exercise per Week: 7 days    Minutes of Exercise per Session: 40 min  Stress: No Stress Concern Present (07/01/2023)   [employer] of Occupational Health - Occupational Stress Questionnaire    Feeling of Stress : Not at all  Social Connections: Socially Integrated (07/01/2023)   Social Connection and Isolation Panel    Frequency of Communication with Friends and Family: Not on file    Frequency of Social Gatherings with Friends and Family: Three times a week    Attends Religious Services: More than 4 times per year    Active Member of Clubs or Organizations: Yes    Attends Banker Meetings: More than 4 times per year    Marital Status: Married  Catering manager Violence: Not At Risk (07/01/2023)   Humiliation, Afraid,  Rape, and Kick questionnaire    Fear of Current or Ex-Partner: No    Emotionally Abused: No    Physically Abused: No    Sexually Abused: No    FAMILY HISTORY: No family history on file.  ALLERGIES:  is allergic to amoxicillin.  MEDICATIONS:  Current Outpatient Medications  Medication Sig Dispense Refill   acyclovir  (ZOVIRAX ) 400 MG tablet Take 1 tablet (400 mg total) by mouth 2 (two) times daily. 60 tablet 5   aspirin  EC 81 MG tablet Take 1 tablet (81 mg total) by mouth daily. 100 tablet 3   Cyanocobalamin  5000 MCG/ML LIQD 1 tablet 1 day or 1 dose.     lenalidomide  (REVLIMID ) 15 MG capsule  Take 1 capsule (15 mg total) by mouth daily. Take 1 capsule (15 mg total) by mouth daily for 21 days. Take 7 days off and repeat cycle. 21 capsule 0   ondansetron  (ZOFRAN ) 8 MG tablet Take 1 tablet (8 mg total) by mouth every 8 (eight) hours as needed for nausea or vomiting. 30 tablet 1   predniSONE  (STERAPRED UNI-PAK 21 TAB) 10 MG (21) TBPK tablet Per instructions (6,5,4,3,2,1) 21 tablet 0   prochlorperazine  (COMPAZINE ) 10 MG tablet Take 1 tablet (10 mg total) by mouth every 6 (six) hours as needed for nausea or vomiting. 30 tablet 1   No current facility-administered medications for this visit.    REVIEW OF SYSTEMS:    10 Point review of Systems was done is negative except as noted above.   PHYSICAL EXAMINATION: ECOG PERFORMANCE STATUS: 1 - Symptomatic but completely ambulatory   Vitals:   05/29/24 1017  BP: (!) 102/57  Pulse: (!) 59  Resp: 17  Temp: 98.1 F (36.7 C)  SpO2: 100%     Filed Weights   05/29/24 1017  Weight: 174 lb (78.9 kg)    .Body mass index is 24.27 kg/m.   GENERAL:alert, in no acute distress and comfortable SKIN: no acute rashes, no significant lesions EYES: conjunctiva are pink and non-injected, sclera anicteric LUNGS: clear to auscultation b/l with normal respiratory effort HEART: regular rate & rhythm Extremity: no pedal edema PSYCH: alert & oriented x 3 with fluent speech NEURO: no focal motor/sensory deficits   LABORATORY DATA:  I have reviewed the data as listed .    Latest Ref Rng & Units 05/29/2024   10:09 AM 05/15/2024    8:49 AM 05/01/2024    9:54 AM  CBC  WBC 4.0 - 10.5 K/uL 4.3  2.9  3.4   Hemoglobin 13.0 - 17.0 g/dL 87.9  88.4  87.8   Hematocrit 39.0 - 52.0 % 35.4  33.8  35.4   Platelets 150 - 400 K/uL 229  182  210     .    Latest Ref Rng & Units 05/29/2024   10:09 AM 05/15/2024    8:49 AM 05/01/2024    9:54 AM  CMP  Glucose 70 - 99 mg/dL 898  898  887   BUN 8 - 23 mg/dL 17  19  16    Creatinine 0.61 - 1.24 mg/dL 9.03  9.01   9.11   Sodium 135 - 145 mmol/L 141  143  143   Potassium 3.5 - 5.1 mmol/L 4.5  4.2  4.2   Chloride 98 - 111 mmol/L 109  110  108   CO2 22 - 32 mmol/L 29  30  31    Calcium 8.9 - 10.3 mg/dL 8.4  8.4  8.6   Total Protein 6.5 -  8.1 g/dL 5.7  5.6  5.6   Total Bilirubin 0.0 - 1.2 mg/dL 0.6  0.6  1.0   Alkaline Phos 38 - 126 U/L 43  37  45   AST 15 - 41 U/L 13  12  12    ALT 0 - 44 U/L 13  15  14       07/17/2023 Surgical pathology:   07/17/2023 Surgical pathology:      RADIOGRAPHIC STUDIES: I have personally reviewed the radiological images as listed and agreed with the findings in the report. No results found.   ASSESSMENT & PLAN:  Colin Calderon is a 73 y.o. male who returns for a follow up for multiple meyloma.   #IgG Lambda  Multiple myeloma -Kappa light chain producing. -BMBx from 07/17/2023 showed two plasmacytoid populations, one kappa and one lambda restricted. The predominant clone is CD20 positive and CD5 and CD10 negative.  -Patient started Dara/Rev/Dex on 11/07/2023.  -Revlimid  was dose reduced from 25 mg to 15 mg on 12/23/2023 due to cytopenias.  PLAN: -Due for Cycle 7, Day 15 of Dara/Rev/Dex. -Labs from today were reviewed and adequate for treatment. WBC 4.3, Hgb 12.0, Plt 229, creatinine 0.96, LFTs in range.  -Most recent myeloma panel from 05/15/2024 showed M protein at 0.1 g/dL, kappa light chain decreased from 47.8 to 35.6 -Patient met with [facility] transplant team and is planning to move forward with harvesting. Awaiting dates and coordination with transplant team.  -Proceed with treatment without any dose modifications.  - Continue aspirin  81 mg p.o. daily for VTE prophylaxis - Continue acyclovir  for HSV and VZV prophylaxis  #Elevated PSA level: --Patient reports most recent PSA with PCP was 5.5 --Prior PET scan shows prostamegaly.  --PCP is making referral to urology  #Hernia repair: --Patient is planning to undergo hernia repair --Advised to  provide dates to make any modifications to his treatment around surgery.   FOLLOW-UP: Per Integrated scheduling  All of the patient's questions were answered with apparent satisfaction. The patient knows to call the clinic with any problems, questions or concerns.  I have spent a total of 30 minutes minutes of face-to-face and non-face-to-face time, preparing to see the patient,  performing a medically appropriate examination, counseling and educating the patient, documenting clinical information in the electronic health record, independently interpreting results and communicating results to the patient, and care coordination.   Colin Police PA-C Dept of Hematology and Oncology [facility] Phone: [phone]

## 2024-05-29 ENCOUNTER — Inpatient Hospital Stay

## 2024-05-29 ENCOUNTER — Inpatient Hospital Stay (HOSPITAL_BASED_OUTPATIENT_CLINIC_OR_DEPARTMENT_OTHER): Admitting: Physician Assistant

## 2024-05-29 VITALS — BP 102/57 | HR 59 | Temp 98.1°F | Resp 17 | Ht 71.0 in | Wt 174.0 lb

## 2024-05-29 DIAGNOSIS — C9000 Multiple myeloma not having achieved remission: Secondary | ICD-10-CM

## 2024-05-29 DIAGNOSIS — Z5112 Encounter for antineoplastic immunotherapy: Secondary | ICD-10-CM | POA: Diagnosis not present

## 2024-05-29 LAB — CMP (CANCER CENTER ONLY)
ALT: 13 U/L (ref 0–44)
AST: 13 U/L — ABNORMAL LOW (ref 15–41)
Albumin: 4.1 g/dL (ref 3.5–5.0)
Alkaline Phosphatase: 43 U/L (ref 38–126)
Anion gap: 3 — ABNORMAL LOW (ref 5–15)
BUN: 17 mg/dL (ref 8–23)
CO2: 29 mmol/L (ref 22–32)
Calcium: 8.4 mg/dL — ABNORMAL LOW (ref 8.9–10.3)
Chloride: 109 mmol/L (ref 98–111)
Creatinine: 0.96 mg/dL (ref 0.61–1.24)
GFR, Estimated: >60 mL/min (ref >60)
Glucose, Bld: 101 mg/dL — ABNORMAL HIGH (ref 70–99)
Potassium: 4.5 mmol/L (ref 3.5–5.1)
Sodium: 141 mmol/L (ref 135–145)
Total Bilirubin: 0.6 mg/dL (ref 0.0–1.2)
Total Protein: 5.7 g/dL — ABNORMAL LOW (ref 6.5–8.1)

## 2024-05-29 LAB — CBC WITH DIFFERENTIAL (CANCER CENTER ONLY)
Abs Immature Granulocytes: 0.01 K/uL (ref 0.00–0.07)
Basophils Absolute: 0.1 K/uL (ref 0.0–0.1)
Basophils Relative: 2 %
Eosinophils Absolute: 0.2 K/uL (ref 0.0–0.5)
Eosinophils Relative: 4 %
HCT: 35.4 % — ABNORMAL LOW (ref 39.0–52.0)
Hemoglobin: 12 g/dL — ABNORMAL LOW (ref 13.0–17.0)
Immature Granulocytes: 0 %
Lymphocytes Relative: 26 %
Lymphs Abs: 1.1 K/uL (ref 0.7–4.0)
MCH: 32.1 pg (ref 26.0–34.0)
MCHC: 33.9 g/dL (ref 30.0–36.0)
MCV: 94.7 fL (ref 80.0–100.0)
Monocytes Absolute: 0.5 K/uL (ref 0.1–1.0)
Monocytes Relative: 11 %
Neutro Abs: 2.4 K/uL (ref 1.7–7.7)
Neutrophils Relative %: 57 %
Platelet Count: 229 K/uL (ref 150–400)
RBC: 3.74 MIL/uL — ABNORMAL LOW (ref 4.22–5.81)
RDW: 14.9 % (ref 11.5–15.5)
WBC Count: 4.3 K/uL (ref 4.0–10.5)
nRBC: 0 % (ref 0.0–0.2)

## 2024-05-29 MED ORDER — DARATUMUMAB-HYALURONIDASE-FIHJ 1800-30000 MG-UT/15ML ~~LOC~~ SOLN
1800.0000 mg | Freq: Once | SUBCUTANEOUS | Status: AC
  Administered 2024-05-29: 1800 mg via SUBCUTANEOUS
  Filled 2024-05-29: qty 15

## 2024-05-29 MED ORDER — DIPHENHYDRAMINE HCL 25 MG PO CAPS
50.0000 mg | ORAL_CAPSULE | Freq: Once | ORAL | Status: AC
  Administered 2024-05-29: 50 mg via ORAL
  Filled 2024-05-29: qty 2

## 2024-05-29 MED ORDER — DEXAMETHASONE 6 MG PO TABS
20.0000 mg | ORAL_TABLET | Freq: Once | ORAL | Status: AC
  Administered 2024-05-29: 20 mg via ORAL
  Filled 2024-05-29: qty 2

## 2024-05-29 MED ORDER — ACETAMINOPHEN 325 MG PO TABS
650.0000 mg | ORAL_TABLET | Freq: Once | ORAL | Status: AC
  Administered 2024-05-29: 650 mg via ORAL
  Filled 2024-05-29: qty 2

## 2024-05-29 NOTE — Patient Instructions (Signed)
 [facility] - A DEPT OF [facility]  Discharge Instructions: Thank you for choosing [facility] to provide your oncology and hematology care.   If you have a lab appointment with the Cancer Center, please go directly to the Cancer Center and check in at the registration area.   Wear comfortable clothing and clothing appropriate for easy access to any Portacath or PICC line.   We strive to give you quality time with your provider. You may need to reschedule your appointment if you arrive late (15 or more minutes).  Arriving late affects you and other patients whose appointments are after yours.  Also, if you miss three or more appointments without notifying the office, you may be dismissed from the clinic at the provider's discretion.      For prescription refill requests, have your pharmacy contact our office and allow 72 hours for refills to be completed.    Today you received the following chemotherapy and/or immunotherapy agents: Daratumumab -hyaluronidase -fihj (Darzalex  faspro)    To help prevent nausea and vomiting after your treatment, we encourage you to take your nausea medication as directed.  BELOW ARE SYMPTOMS THAT SHOULD BE REPORTED IMMEDIATELY: *FEVER GREATER THAN 100.4 F (38 C) OR HIGHER *CHILLS OR SWEATING *NAUSEA AND VOMITING THAT IS NOT CONTROLLED WITH YOUR NAUSEA MEDICATION *UNUSUAL SHORTNESS OF BREATH *UNUSUAL BRUISING OR BLEEDING *URINARY PROBLEMS (pain or burning when urinating, or frequent urination) *BOWEL PROBLEMS (unusual diarrhea, constipation, pain near the anus) TENDERNESS IN MOUTH AND THROAT WITH OR WITHOUT PRESENCE OF ULCERS (sore throat, sores in mouth, or a toothache) UNUSUAL RASH, SWELLING OR PAIN  UNUSUAL VAGINAL DISCHARGE OR ITCHING   Items with * indicate a potential emergency and should be followed up as soon as possible or go to the Emergency Department if any problems should occur.  Please show the  CHEMOTHERAPY ALERT CARD or IMMUNOTHERAPY ALERT CARD at check-in to the Emergency Department and triage nurse.  Should you have questions after your visit or need to cancel or reschedule your appointment, please contact [facility] - A DEPT OF [facility]  Dept: [phone]  and follow the prompts.  Office hours are 8:00 a.m. to 4:30 p.m. Monday - Friday. Please note that voicemails left after 4:00 p.m. may not be returned until the following business day.  We are closed weekends and major holidays. You have access to a nurse at all times for urgent questions. Please call the main number to the clinic Dept: [phone] and follow the prompts.   For any non-urgent questions, you may also contact your provider using MyChart. We now offer e-Visits for anyone 46 and older to request care online for non-urgent symptoms. For details visit [email or web address].   Also download the MyChart app! Go to the app store, search MyChart, open the app, select [place], and log in with your MyChart username and password.

## 2024-06-01 NOTE — Progress Notes (Signed)
 Per [facility] pt to stop all tx by 06/19/24 for 2 week wash out period for transplant. Pt 's last tx at [facility] pre transplant will be 06/12/24

## 2024-06-03 ENCOUNTER — Other Ambulatory Visit: Payer: Self-pay

## 2024-06-03 DIAGNOSIS — C9000 Multiple myeloma not having achieved remission: Secondary | ICD-10-CM

## 2024-06-03 MED ORDER — LENALIDOMIDE 15 MG PO CAPS: 15.0000 mg | ORAL_CAPSULE | Freq: Every day | ORAL | 0 refills | Status: DC

## 2024-06-04 ENCOUNTER — Other Ambulatory Visit: Payer: Self-pay

## 2024-06-08 ENCOUNTER — Encounter: Payer: Self-pay | Admitting: Hematology

## 2024-06-08 NOTE — Progress Notes (Signed)
 Pt's tx canceled at [facility] on 10/17 and 10/31 for [facility] Transplant collection. Pt will be able to restart tx at [facility] 07/13/24.

## 2024-06-09 DIAGNOSIS — C9000 Multiple myeloma not having achieved remission: Secondary | ICD-10-CM | POA: Diagnosis not present

## 2024-06-11 ENCOUNTER — Other Ambulatory Visit: Payer: Self-pay | Admitting: Hematology

## 2024-06-12 ENCOUNTER — Inpatient Hospital Stay

## 2024-06-12 ENCOUNTER — Inpatient Hospital Stay: Attending: Oncology

## 2024-06-12 VITALS — BP 116/70 | HR 55 | Temp 97.6°F | Resp 16 | Wt 172.5 lb

## 2024-06-12 DIAGNOSIS — Z5112 Encounter for antineoplastic immunotherapy: Secondary | ICD-10-CM | POA: Diagnosis present

## 2024-06-12 DIAGNOSIS — Z79899 Other long term (current) drug therapy: Secondary | ICD-10-CM | POA: Diagnosis not present

## 2024-06-12 DIAGNOSIS — C9000 Multiple myeloma not having achieved remission: Secondary | ICD-10-CM | POA: Diagnosis not present

## 2024-06-12 LAB — CBC WITH DIFFERENTIAL (CANCER CENTER ONLY)
Abs Immature Granulocytes: 0 K/uL (ref 0.00–0.07)
Basophils Absolute: 0.1 K/uL (ref 0.0–0.1)
Basophils Relative: 4 %
Eosinophils Absolute: 0.3 K/uL (ref 0.0–0.5)
Eosinophils Relative: 10 %
HCT: 35.3 % — ABNORMAL LOW (ref 39.0–52.0)
Hemoglobin: 12.1 g/dL — ABNORMAL LOW (ref 13.0–17.0)
Immature Granulocytes: 0 %
Lymphocytes Relative: 43 %
Lymphs Abs: 1.1 K/uL (ref 0.7–4.0)
MCH: 32.4 pg (ref 26.0–34.0)
MCHC: 34.3 g/dL (ref 30.0–36.0)
MCV: 94.4 fL (ref 80.0–100.0)
Monocytes Absolute: 0.3 K/uL (ref 0.1–1.0)
Monocytes Relative: 14 %
Neutro Abs: 0.7 K/uL — ABNORMAL LOW (ref 1.7–7.7)
Neutrophils Relative %: 29 %
Platelet Count: 190 K/uL (ref 150–400)
RBC: 3.74 MIL/uL — ABNORMAL LOW (ref 4.22–5.81)
RDW: 14.8 % (ref 11.5–15.5)
WBC Count: 2.4 K/uL — ABNORMAL LOW (ref 4.0–10.5)
nRBC: 0 % (ref 0.0–0.2)

## 2024-06-12 LAB — CMP (CANCER CENTER ONLY)
ALT: 21 U/L (ref 0–44)
AST: 20 U/L (ref 15–41)
Albumin: 4.2 g/dL (ref 3.5–5.0)
Alkaline Phosphatase: 46 U/L (ref 38–126)
Anion gap: 3 — ABNORMAL LOW (ref 5–15)
BUN: 18 mg/dL (ref 8–23)
CO2: 30 mmol/L (ref 22–32)
Calcium: 9.1 mg/dL (ref 8.9–10.3)
Chloride: 109 mmol/L (ref 98–111)
Creatinine: 1.1 mg/dL (ref 0.61–1.24)
GFR, Estimated: >60 mL/min (ref >60)
Glucose, Bld: 147 mg/dL — ABNORMAL HIGH (ref 70–99)
Potassium: 4.4 mmol/L (ref 3.5–5.1)
Sodium: 142 mmol/L (ref 135–145)
Total Bilirubin: 0.5 mg/dL (ref 0.0–1.2)
Total Protein: 5.9 g/dL — ABNORMAL LOW (ref 6.5–8.1)

## 2024-06-12 MED ORDER — ACETAMINOPHEN 325 MG PO TABS
650.0000 mg | ORAL_TABLET | Freq: Once | ORAL | Status: AC
  Administered 2024-06-12: 650 mg via ORAL
  Filled 2024-06-12: qty 2

## 2024-06-12 MED ORDER — DARATUMUMAB-HYALURONIDASE-FIHJ 1800-30000 MG-UT/15ML ~~LOC~~ SOLN
1800.0000 mg | Freq: Once | SUBCUTANEOUS | Status: AC
  Administered 2024-06-12: 1800 mg via SUBCUTANEOUS
  Filled 2024-06-12: qty 15

## 2024-06-12 MED ORDER — DIPHENHYDRAMINE HCL 25 MG PO CAPS
50.0000 mg | ORAL_CAPSULE | Freq: Once | ORAL | Status: AC
  Administered 2024-06-12: 50 mg via ORAL
  Filled 2024-06-12: qty 2

## 2024-06-12 MED ORDER — DEXAMETHASONE 6 MG PO TABS
20.0000 mg | ORAL_TABLET | Freq: Once | ORAL | Status: AC
  Administered 2024-06-12: 20 mg via ORAL
  Filled 2024-06-12: qty 2

## 2024-06-12 NOTE — Patient Instructions (Signed)

## 2024-06-12 NOTE — Progress Notes (Signed)
Ok to treat with low ANC 

## 2024-06-15 DIAGNOSIS — R001 Bradycardia, unspecified: Secondary | ICD-10-CM | POA: Diagnosis not present

## 2024-06-15 DIAGNOSIS — I517 Cardiomegaly: Secondary | ICD-10-CM | POA: Diagnosis not present

## 2024-06-15 DIAGNOSIS — I444 Left anterior fascicular block: Secondary | ICD-10-CM | POA: Diagnosis not present

## 2024-06-15 DIAGNOSIS — I081 Rheumatic disorders of both mitral and tricuspid valves: Secondary | ICD-10-CM | POA: Diagnosis not present

## 2024-06-15 DIAGNOSIS — Z52011 Autologous donor, stem cells: Secondary | ICD-10-CM | POA: Diagnosis not present

## 2024-06-15 DIAGNOSIS — C9000 Multiple myeloma not having achieved remission: Secondary | ICD-10-CM | POA: Diagnosis not present

## 2024-06-15 DIAGNOSIS — Z7961 Long term (current) use of immunomodulator: Secondary | ICD-10-CM | POA: Diagnosis not present

## 2024-06-15 DIAGNOSIS — Z01818 Encounter for other preprocedural examination: Secondary | ICD-10-CM | POA: Diagnosis not present

## 2024-06-15 LAB — KAPPA/LAMBDA LIGHT CHAINS
Kappa free light chain: 29.3 mg/L — ABNORMAL HIGH (ref 3.3–19.4)
Kappa, lambda light chain ratio: 13.32 — ABNORMAL HIGH (ref 0.26–1.65)
Lambda free light chains: 2.2 mg/L — ABNORMAL LOW (ref 5.7–26.3)

## 2024-06-16 ENCOUNTER — Ambulatory Visit (INDEPENDENT_AMBULATORY_CARE_PROVIDER_SITE_OTHER): Admitting: Audiology

## 2024-06-16 ENCOUNTER — Ambulatory Visit (INDEPENDENT_AMBULATORY_CARE_PROVIDER_SITE_OTHER): Admitting: Otolaryngology

## 2024-06-16 ENCOUNTER — Ambulatory Visit: Payer: Self-pay | Admitting: General Surgery

## 2024-06-16 DIAGNOSIS — C9000 Multiple myeloma not having achieved remission: Secondary | ICD-10-CM | POA: Diagnosis not present

## 2024-06-16 DIAGNOSIS — Z52011 Autologous donor, stem cells: Secondary | ICD-10-CM | POA: Diagnosis not present

## 2024-06-16 DIAGNOSIS — K4090 Unilateral inguinal hernia, without obstruction or gangrene, not specified as recurrent: Secondary | ICD-10-CM | POA: Diagnosis not present

## 2024-06-16 DIAGNOSIS — Z860101 Personal history of adenomatous and serrated colon polyps: Secondary | ICD-10-CM | POA: Diagnosis not present

## 2024-06-16 DIAGNOSIS — Z01818 Encounter for other preprocedural examination: Secondary | ICD-10-CM | POA: Diagnosis not present

## 2024-06-16 DIAGNOSIS — D472 Monoclonal gammopathy: Secondary | ICD-10-CM | POA: Diagnosis not present

## 2024-06-16 LAB — MULTIPLE MYELOMA PANEL, SERUM
Albumin SerPl Elph-Mcnc: 3.5 g/dL (ref 2.9–4.4)
Albumin/Glob SerPl: 1.9 — ABNORMAL HIGH (ref 0.7–1.7)
Alpha 1: 0.2 g/dL (ref 0.0–0.4)
Alpha2 Glob SerPl Elph-Mcnc: 0.6 g/dL (ref 0.4–1.0)
B-Globulin SerPl Elph-Mcnc: 0.8 g/dL (ref 0.7–1.3)
Gamma Glob SerPl Elph-Mcnc: 0.3 g/dL — ABNORMAL LOW (ref 0.4–1.8)
Globulin, Total: 1.9 g/dL — ABNORMAL LOW (ref 2.2–3.9)
IgA: 8 mg/dL — ABNORMAL LOW (ref 61–437)
IgG (Immunoglobin G), Serum: 318 mg/dL — ABNORMAL LOW (ref 603–1613)
IgM (Immunoglobulin M), Srm: <5 mg/dL — ABNORMAL LOW (ref 15–143)
M Protein SerPl Elph-Mcnc: 0.1 g/dL — ABNORMAL HIGH
Total Protein ELP: 5.4 g/dL — ABNORMAL LOW (ref 6.0–8.5)

## 2024-06-17 DIAGNOSIS — Z01818 Encounter for other preprocedural examination: Secondary | ICD-10-CM | POA: Diagnosis not present

## 2024-06-17 DIAGNOSIS — Z52011 Autologous donor, stem cells: Secondary | ICD-10-CM | POA: Diagnosis not present

## 2024-06-17 DIAGNOSIS — C9000 Multiple myeloma not having achieved remission: Secondary | ICD-10-CM | POA: Diagnosis not present

## 2024-06-24 ENCOUNTER — Other Ambulatory Visit: Payer: Self-pay

## 2024-06-26 ENCOUNTER — Other Ambulatory Visit

## 2024-06-26 ENCOUNTER — Ambulatory Visit: Admitting: Hematology

## 2024-06-26 ENCOUNTER — Ambulatory Visit

## 2024-07-01 DIAGNOSIS — C9000 Multiple myeloma not having achieved remission: Secondary | ICD-10-CM | POA: Diagnosis not present

## 2024-07-01 NOTE — Progress Notes (Signed)
 [facility]  Name: Colin Calderon MRN:  [id number] Date:  07/01/2024  PCP: Gwenn SHAUNNA Shams, MD  Referring physician: No referring provider defined for this encounter.  Diagnosis: Multiple Myeloma, IgG Lambda Date of Diagnosis: 07/17/23 Stage: R-ISS stage I (alb 4.3, LHD 122) Pathology: Plasma cell myeloma in a hypercellular bone marrow with decreased trilineage hematopoiesis and approximately 60-70% kappa-restricted plasma cells.  Previous Treatment: none Current Treatment:  D-Rvd (rev held due to concern for neuropathy/carpal tunnel) C1: ~02/25   HPI: 73 yo M presents for evaluation of abnormal plasmacytoid population on bone marrow biopsy.  Patient with long standing history of macrocytic anemia that was previously attributed to B12 def.  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 Due to abnormal findings, bone marrow biopsy was performed which showed two plasmacytoid populations, one kappa and one lambda restricted.  The predominant clone is CD20 positive and CD5 and CD10 negative. Patient has overall felt fairly well.  He denies any fevers, chills or night sweats.  Denies any vision or hearing changes.  No new pains.  EMG and EEG scheduled in future.  Interim History: Patient presents for follow up of his myeloma currently on Drd.  Doing fairly well today.  Denies any new pains.  No fevers, chills or night sweats.  No bleeding issues.  No vision or hearing changes.  REVIEW OF SYSTEMS: Negative except as stated above  ECOG 1 - Symptomatic but completely ambulatory, Karnofsky Performance Scale:  90% - Able to carry on normal activity; minor signs or symptoms of disease  PAST MEDICAL HISTORY: he has a past medical history of Anemia and History of adenomatous polyp of colon.  PAST SURGICAL  HISTORY:  has a past surgical history that includes Colonoscopy and Inguinal Hernia Repair (Right).  MEDICATIONS:  Meds Ordered in [facility]  Medication Sig Dispense Refill  . acyclovir  (ZOVIRAX ) 400 mg tablet Take 400 mg by mouth.    . cyanocobalamin /folic acid  (vitamin B12-folic acid ) 1,000-400 mcg lozg Take 1 tablet by mouth daily.    . aspirin  81 mg EC tablet Take 81 mg by mouth. (Patient not taking: Reported on 07/01/2024)    . benzonatate  (TESSALON ) 100 mg capsule Take 100 mg by mouth. (Patient not taking: Reported on 07/01/2024)    . cyanocobalamin -cobamamide (B-12 Plus) 5,000-100 mcg subl Take 1 tablet by mouth daily. (Patient not taking: Reported on 07/01/2024)    . diclofenac sodium (DICLO GEL TOP) Apply topically. (Patient not taking: Reported on 07/01/2024)    . lenalidomide  (REVLIMID ) 15 mg cap Take 1 capsule by mouth once daily for 21 days, then 7 days off. (Patient not taking: Reported on 07/01/2024)    . ondansetron  (ZOFRAN ) 8 mg tablet Take 8 mg by mouth every 8 (eight) hours as needed for nausea or vomiting. (Patient not taking: Reported on 07/01/2024)    . prochlorperazine  (COMPAZINE ) 10 mg tablet Take 10 mg by mouth every 6 (six) hours as needed for nausea or vomiting. (Patient not taking: Reported on 07/01/2024)     No current Epic-ordered facility-administered medications on file.    ALLERGIES:  Allergies  Allergen Reactions  . Amoxicillin Itching    SOCIAL HISTORY:  reports that he has never smoked. He has never used smokeless tobacco. He reports current alcohol use. He reports that he does not currently use drugs.  FAMILY HISTORY: family history includes Coronary artery disease in his father; Leukemia (age of onset: 38) in his brother.  No other family history of leukemia, lymphoma, or myeloma.   PHYSICAL EXAMINATION: VITAL SIGNS:  Review Flowsheet  More data exists      03/03/2024 05/05/2024 06/16/2024 07/01/2024  ONCBCN RECENT VITALS  Height 180 cm 180 cm -  180 cm  Weight 78.563 kg 79.062 kg - 77.293 kg  BSA (Calculated - sq m) 1.98 m2 1.99 m2 - 1.97 m2  Temp 97.3 F (36.3 C) 98.4 F (36.9 C) - 98.6 F (37 C)  BP 108/62 128/67 134/79 112/75 110/72 112/71 108/65 109/58* 112/64 119/62 115/64 114/62 118/68 123/73  Heart Rate 59 53 53 53 49* 50 47* 48* 52 51 51 50 55 55 52 52 0* 69  Respiratory Rate 16 16 14  0* 0* 0* 11* 0* 10* 9* 7* 0* 8* 16  Oxygen  Saturation 97 % 99 % 99 % 99 % 99 % 97 % 97 % 99 % 97 % 97 % 91 % 88 %* 99 % 100 % 99 % 97 % 100 % 98 %  Oxygen  Device - - None (Room air) ETCO2 cannula -    Details       Multiple values from one day are sorted in reverse-chronological order        GENERAL: WDWN male in NAD, sitting comfortably in bedside chair HEENT: Anicteric sclerae, EOMI, PERRL. Mucous membranes moist without oropharyngeal lesions. NECK: Supple without JVD or thyromegaly. LUNGS: Clear to auscultation without wheezes, crackles or rhonchi. CARDIAC: Regular rate and rhythm, S1, S2, no murmurs, rubs or gallops. ABDOMEN: Soft, non distended, non tender, normoactive bowel sounds. No hepatosplenomegaly EXTREMITIES: No edema, clubbing or cyanosis. 2+ pulses distally. NEUROLOGIC: Cranial nerves III-XII grossly symmetric and intact. SKIN: No petechiae, large ecchymoses, or rash noted. LYMPH: No cervical, supraclavicular, axillary or inguinal adenopathy  LABORATORY DATA: No results found for this or any previous visit (from the past week).  RADIOGRAPHIC DATA: PET 07/26/23 1. Hypermetabolic lymph nodes in the low back, chest and upper  abdomen (Deauville 4-5).  2. Left anterior descending coronary artery calcification.  3. Enlarged prostate. Associated bladder wall thickening, indicative of an element of outlet obstruction.   PET 06/15/24 1.  Interval decrease in FDG avidity of previously seen hypermetabolic lymph nodes above and below the diaphragm. There is a new left periclavicular lymph node with mild FDG uptake. 2.   Resolution of the previously seen hypermetabolic osseous lesions and background bone marrow uptake. No new FDG avid or lytic osseous lesion.   PATHOLOGY DATA:  BMBx 07/17/23 Plasma cell myeloma in a hypercellular bone marrow with decreased trilineage hematopoiesis and approximately 60-70% kappa-restricted plasma cells.  The bone marrow is hypercellular (70%) and shows decreased myelopoiesis and erythropoiesis. Megakaryocytes are decreased with normal morphology. Blasts are not increased. Large collections of atypical small plasma cell are seen that comprise 60-70% of the cellularity. These cells are CD138, Cyclin D1 and CD20 positive and kappa light chain restricted. These cells are CD56 negative. A small population of lambda restricted plasma cells is also seen. These cells are CD56 positive. A Prussian blue stain of the clot highlights the presence of storage iron.  By outside report,  myeloma and low-grade B-cell lymphoma FISH analysis revealed an atypical t(11;14) and IGH::CCND1 fusion, signals for the remaining probes were within the normal reference range  MYD88 negative  BMBx 06/16/24 Cellular bone marrow with involvement by a plasma cell neoplasm (10%  plasma cells by CD138 IHC analysis; kappa-restricted).  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  Bone marrow was revealing of two populations, the predominant clone appears to be the kappa restricted clone, CD 20 positive and CD10/CD5 negative, no elevation in IgM.  Path reviewed at [facility] and most consistent with myeloma.  MYD 88 negative.  R-ISS stage I with t(11:14).  ECOG 0-1.  Although does have EMD with lymph node involvement.   He has started on treatment with D-Rd and tolerating relatively well.  Having some issues with cytopenias, neutropenia.  (Velcade held due to concern for  carpal tunnel/neuropathy).  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 Previously discussed transplant with patient and family.  He is not interested in proceeding with transplant but is willing to move forward with harvest and hold.  With continued disease on marrow, would continued Drd at this time as responding.  Would consider addition of PI in future if there were to be concern with stagnant or worsening disease burden.  Would also look to consider repeat imaging in upcoming months to follow up lymph node and see if any progression of lymph node burden. - continue DRD at this time - H&H in near future  RTC per transplant discussion   *Some images could not be shown.

## 2024-07-02 DIAGNOSIS — R399 Unspecified symptoms and signs involving the genitourinary system: Secondary | ICD-10-CM | POA: Diagnosis not present

## 2024-07-02 DIAGNOSIS — R972 Elevated prostate specific antigen [PSA]: Secondary | ICD-10-CM | POA: Diagnosis not present

## 2024-07-02 DIAGNOSIS — R3911 Hesitancy of micturition: Secondary | ICD-10-CM | POA: Diagnosis not present

## 2024-07-02 DIAGNOSIS — R3912 Poor urinary stream: Secondary | ICD-10-CM | POA: Diagnosis not present

## 2024-07-02 DIAGNOSIS — R351 Nocturia: Secondary | ICD-10-CM | POA: Diagnosis not present

## 2024-07-02 DIAGNOSIS — N401 Enlarged prostate with lower urinary tract symptoms: Secondary | ICD-10-CM | POA: Diagnosis not present

## 2024-07-04 DIAGNOSIS — Z52011 Autologous donor, stem cells: Secondary | ICD-10-CM | POA: Diagnosis not present

## 2024-07-04 DIAGNOSIS — C9000 Multiple myeloma not having achieved remission: Secondary | ICD-10-CM | POA: Diagnosis not present

## 2024-07-05 DIAGNOSIS — C9000 Multiple myeloma not having achieved remission: Secondary | ICD-10-CM | POA: Diagnosis not present

## 2024-07-05 DIAGNOSIS — Z52011 Autologous donor, stem cells: Secondary | ICD-10-CM | POA: Diagnosis not present

## 2024-07-06 DIAGNOSIS — Z52011 Autologous donor, stem cells: Secondary | ICD-10-CM | POA: Diagnosis not present

## 2024-07-06 DIAGNOSIS — C9000 Multiple myeloma not having achieved remission: Secondary | ICD-10-CM | POA: Diagnosis not present

## 2024-07-07 DIAGNOSIS — Z52011 Autologous donor, stem cells: Secondary | ICD-10-CM | POA: Diagnosis not present

## 2024-07-07 DIAGNOSIS — C9000 Multiple myeloma not having achieved remission: Secondary | ICD-10-CM | POA: Diagnosis not present

## 2024-07-08 ENCOUNTER — Encounter: Payer: Self-pay | Admitting: Hematology

## 2024-07-08 DIAGNOSIS — Z01818 Encounter for other preprocedural examination: Secondary | ICD-10-CM | POA: Diagnosis not present

## 2024-07-08 DIAGNOSIS — Z52011 Autologous donor, stem cells: Secondary | ICD-10-CM | POA: Diagnosis not present

## 2024-07-08 DIAGNOSIS — C9000 Multiple myeloma not having achieved remission: Secondary | ICD-10-CM | POA: Diagnosis not present

## 2024-07-09 DIAGNOSIS — Z52011 Autologous donor, stem cells: Secondary | ICD-10-CM | POA: Diagnosis not present

## 2024-07-09 DIAGNOSIS — C9000 Multiple myeloma not having achieved remission: Secondary | ICD-10-CM | POA: Diagnosis not present

## 2024-07-10 ENCOUNTER — Other Ambulatory Visit

## 2024-07-10 ENCOUNTER — Ambulatory Visit

## 2024-07-10 DIAGNOSIS — C9000 Multiple myeloma not having achieved remission: Secondary | ICD-10-CM | POA: Diagnosis not present

## 2024-07-10 DIAGNOSIS — Z01818 Encounter for other preprocedural examination: Secondary | ICD-10-CM | POA: Diagnosis not present

## 2024-07-10 DIAGNOSIS — Z52011 Autologous donor, stem cells: Secondary | ICD-10-CM | POA: Diagnosis not present

## 2024-07-10 DIAGNOSIS — Z452 Encounter for adjustment and management of vascular access device: Secondary | ICD-10-CM | POA: Diagnosis not present

## 2024-07-20 ENCOUNTER — Encounter (HOSPITAL_COMMUNITY): Payer: Self-pay

## 2024-07-20 NOTE — Progress Notes (Signed)
 Surgical Instructions   Your procedure is scheduled on Tuesday July 28, 2024. Report to [facility] Main Entrance A at 8:00 A.M., then check in with the Admitting office. Any questions or running late day of surgery: call [phone]  Questions prior to your surgery date: call [phone], Monday-Friday, 8am-4pm. If you experience any cold or flu symptoms such as cough, fever, chills, shortness of breath, etc. between now and your scheduled surgery, please notify us  at the above number.     Remember:  Do not eat after midnight the night before your surgery   You may drink clear liquids until 7:00 the morning of your surgery.   Clear liquids allowed are: Water, Non-Citrus Juices (without pulp), Carbonated Beverages, Clear Tea (no milk, honey, etc.), Black Coffee Only (NO MILK, CREAM OR POWDERED CREAMER of any kind), and Gatorade.  Patient Instructions  The night before surgery:  No food after midnight. ONLY clear liquids after midnight  The day of surgery (if you do NOT have diabetes):  Drink ONE (1) Pre-Surgery Clear Ensure by 7:00 the morning of surgery. Drink in one sitting. Do not sip.  This drink was given to you during your hospital  pre-op appointment visit.  Nothing else to drink after completing the  Pre-Surgery Clear Ensure.         If you have questions, please contact your surgeon's office.  Take these medicines the morning of surgery with A SIP OF WATER  acyclovir  (ZOVIRAX )   Follow your surgeon's instructions on when to stop Asprin.  If no instructions were given by your surgeon then you will need to call the office to get those instructions.     One week prior to surgery, STOP taking any Aleve, Naproxen, Ibuprofen , Motrin , Advil , Goody's, BC's, all herbal medications, fish oil, and non-prescription vitamins.                     Do NOT Smoke (Tobacco/Vaping) for 24 hours prior to your procedure.  If you use a CPAP at night, you may bring your  mask/headgear for your overnight stay.   You will be asked to remove any contacts, glasses, piercing's, hearing aid's, dentures/partials prior to surgery. Please bring cases for these items if needed.    Patients discharged the day of surgery will not be allowed to drive home, and someone needs to stay with them for 24 hours.  SURGICAL WAITING ROOM VISITATION Patients may have no more than 2 support people in the waiting area - these visitors may rotate.   Pre-op nurse will coordinate an appropriate time for 1 ADULT support person, who may not rotate, to accompany patient in pre-op.  Children under the age of 59 must have an adult with them who is not the patient and must remain in the main waiting area with an adult.  If the patient needs to stay at the hospital during part of their recovery, the visitor guidelines for inpatient rooms apply.  Please refer to the [facility] website for the visitor guidelines for any additional information.   If you received a COVID test during your pre-op visit  it is requested that you wear a mask when out in public, stay away from anyone that may not be feeling well and notify your surgeon if you develop symptoms. If you have been in contact with anyone that has tested positive in the last 10 days please notify you surgeon.      Pre-operative CHG Bathing Instructions  You can play a key role in reducing the risk of infection after surgery. Your skin needs to be as free of germs as possible. You can reduce the number of germs on your skin by washing with CHG (chlorhexidine gluconate) soap before surgery. CHG is an antiseptic soap that kills germs and continues to kill germs even after washing.   DO NOT use if you have an allergy to chlorhexidine/CHG or antibacterial soaps. If your skin becomes reddened or irritated, stop using the CHG and notify one of our RNs at [phone].              TAKE A SHOWER THE NIGHT BEFORE SURGERY   Please keep in mind  the following:  DO NOT shave, including legs and underarms, 48 hours prior to surgery.   Place clean sheets on your bed the night before surgery Use a clean washcloth (not used since being washed) for shower. DO NOT sleep with pet's night before surgery.  CHG Shower Instructions:  Wash your face and private area with normal soap. If you choose to wash your hair, wash first with your normal shampoo.  After you use shampoo/soap, rinse your hair and body thoroughly to remove shampoo/soap residue.  Turn the water OFF and apply half the bottle of CHG soap to a CLEAN washcloth.  Apply CHG soap ONLY FROM YOUR NECK DOWN TO YOUR TOES (washing for 3-5 minutes)  DO NOT use CHG soap on face, private areas, open wounds, or sores.  Pay special attention to the area where your surgery is being performed.  If you are having back surgery, having someone wash your back for you may be helpful. Wait 2 minutes after CHG soap is applied, then you may rinse off the CHG soap.  Pat dry with a clean towel  Put on clean pajamas    Additional instructions for the day of surgery: If you choose, you may shower the morning of surgery with an antibacterial soap.  DO NOT APPLY any lotions, deodorants or perfumes.   Do not wear jewelry or makeup Do not wear nail polish, gel polish, artificial nails, or any other type of covering on natural nails (fingers and toes) Do not bring valuables to the hospital. [facility] is not responsible for valuables/personal belongings. Put on clean/comfortable clothes.  Please brush your teeth.  Ask your nurse before applying any prescription medications to the skin.

## 2024-07-21 ENCOUNTER — Encounter (HOSPITAL_COMMUNITY): Payer: Self-pay

## 2024-07-21 ENCOUNTER — Encounter (HOSPITAL_COMMUNITY)
Admission: RE | Admit: 2024-07-21 | Discharge: 2024-07-21 | Disposition: A | Discharge status: Home / Self Care | Source: Ambulatory Visit | Attending: General Surgery | Admitting: General Surgery

## 2024-07-21 ENCOUNTER — Other Ambulatory Visit: Payer: Self-pay

## 2024-07-21 VITALS — BP 123/68 | HR 73 | Temp 97.8°F | Resp 17 | Ht 71.0 in | Wt 169.0 lb

## 2024-07-21 DIAGNOSIS — D519 Vitamin B12 deficiency anemia, unspecified: Secondary | ICD-10-CM | POA: Diagnosis not present

## 2024-07-21 DIAGNOSIS — K4090 Unilateral inguinal hernia, without obstruction or gangrene, not specified as recurrent: Secondary | ICD-10-CM | POA: Insufficient documentation

## 2024-07-21 DIAGNOSIS — C9000 Multiple myeloma not having achieved remission: Secondary | ICD-10-CM | POA: Insufficient documentation

## 2024-07-21 DIAGNOSIS — Z01818 Encounter for other preprocedural examination: Secondary | ICD-10-CM | POA: Insufficient documentation

## 2024-07-21 HISTORY — DX: Multiple myeloma not having achieved remission: C90.00

## 2024-07-21 HISTORY — DX: Anemia, unspecified: D64.9

## 2024-07-21 HISTORY — DX: Personal history of other diseases of the digestive system: Z87.19

## 2024-07-21 HISTORY — DX: Pneumonia, unspecified organism: J18.9

## 2024-07-21 NOTE — Progress Notes (Signed)
 PCP - Dale Gull Cardiologist - denies Hematologist - Dr. Norleen Funk and Dr. Emaline Saran  PPM/ICD - denies Device Orders - n/a Rep Notified - n/a  Chest x-ray - 06/15/24 EKG - 06/15/24 - requested tracing Stress Test -  ECHO - 06/15/24 Cardiac Cath -   Sleep Study - recently had sleep study, no CPAP needed.     No DM  Last dose of GLP1 agonist-  denies GLP1 instructions: denies  Blood Thinner Instructions: n/a Aspirin  Instructions:  Have been off of Aspirin  for weeks  ERAS Protcol - clears until 0700 PRE-SURGERY Ensure or G2- Ensure as ordered  COVID TEST- no   Anesthesia review: yes - multiple myeloma  Patient denies shortness of breath, fever, cough and chest pain at PAT appointment   All instructions explained to the patient, with a verbal understanding of the material. Patient agrees to go over the instructions while at home for a better understanding. Patient also instructed to self quarantine after being tested for COVID-19. The opportunity to ask questions was provided.   Patient states that he is having labs collected on Thursday, November 13th at the [facility] and has requested that we do not collect labs on him today.  He is aware that he will need a CBC and CMET.  Notified Lynwood Hope and will proceed with this plan.

## 2024-07-22 ENCOUNTER — Other Ambulatory Visit: Payer: Self-pay

## 2024-07-22 NOTE — Progress Notes (Signed)
 HEMATOLOGY ONCOLOGY PROGRESS NOTE  Date of service: 07/23/2024  Patient Care Team: [name], MD as PCP - General (Hematology) Huey, [name], MD as Referring Physician (Neurology)  CHIEF COMPLAINT/PURPOSE OF CONSULTATION: Follow-up for continued evaluation and management of Multiple Myeloma  HISTORY OF PRESENTING ILLNESS: (07/25/2023) Colin Calderon is a wonderful 73 y.o. male who has been referred to us  by [name] Huey, MD for evaluation and management of abnormal protein.    He presented to the ED on 07/06/2023 for pneumonia of both lungs due to infectious organism.    Today, he is accompanied by his wife and friend. 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 His anemia is gradually worsening, previously hgb was11.2, 10 months ago and more recently 9.7 a week ago.    He reports that he was having some numbness in his hands and feet present for 2-4 weeks, which caused him to see a neurologist. As part of the neurologist's workup to see if his symptoms are related to paraproteinemia, there were findings of abnormal protein with M protein level of 0.2 g/dL. His wife reports that he has a nerve conduction study planned for December.    He denies any new fatigue, unexplained fever, chills, night sweats, or unexpected weight loss. He reports a 3-pound weight loss based on his scale at home.    He reports that he recently had pneumonia which was not unusual as he did have pneumonia previosuly. Patient notes that he did develop a fever causing him to present to the ED. Patient denies any SOB at this time, but does endorse sporadic coughing. He denies any issues with urinary infections or sinus infections.    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    He reports having a slightly enlarged prostate.    Patient reports having his thyroid  checked with [facility] recently, but can't find the results. He reports a fhx of thyroid  issues.    He  denies any new leg swelling, skin rashes, change in vision that is new or different, or headaches that are new or different. Patient denies any abdominal symptoms, such as abdominal pain or abdominal distention.     Patient denies having any chronic medical issues such as heart or lung issues.    He reports that he only takes B-12 replacement. Patient notes that he was previously B12 deficient in the past. He reports that the vitamin pills were not being absorbed.    Patient tends to be vegetarian for the most part, but does eats chicken occasionally.    He has no other medical issues.    He reports a surgical history of inguinal hernia surgery years ago most likely on the right side.    He reports an allergy to amoxicillin with rash reaction. He has not other medication allergies.    Patient does report a history of thyroid  disorders in the family.  He reports that his brother passed away from leukemia. Patient reports that his father did have coronary issues 54  Mother 34 years   He is a never smoker and denies any alcohol consumption or use of other recreational chemicals.    Previously, patient was generally very active through activities including hiking, walking, and swimming. However, over the last couple of months he has endorsed new fatigue alongside worsening anemia. His wife reports that he has sometimes felt too tired to drive, which is unusual. She also noticed that he lags behind when they go  for walks together over the last six months.    He complains of poor sleep mostly due to urinary frequency, which is not a new symptom. 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 Patient reports very sensitive arms and legs. Patient reports that his mother did have horrible neuropathy and is constantly in pain.    His wife reports that patient previously had a loud voice and has lost his voice recently, which patient attributes to pneumonia. He  denies any chest discomfort at this time.    He did have thyroid  function checked recently. Patient denies any obvious lumps/bumps.Patient denies any spine pain or abdominal pain.    SUMMARY OF ONCOLOGIC HISTORY: Oncology History  Multiple myeloma (HCC)  10/24/2023 Initial Diagnosis   Multiple myeloma (HCC)   11/07/2023 -  Chemotherapy   Patient is on Treatment Plan : MYELOMA NEWLY DIAGNOSED TRANSPLANT CANDIDATE DaraVRd (Daratumumab  SQ) q21d x 6 Cycles (Induction/Consolidation)      INTERVAL HISTORY: Colin Calderon is a 73 y.o. male who is here today for continued evaluation and management of Multiple Myeloma and next cycle of Dara/Rev/Dex. accompanied by wife   he was last seen by me on 05/01/2024; at the time he mentioned experiencing issues with his Carpal Tunnel Syndrome and inguinal hernia.  On 05/29/2024 he was seen by Neomi Johnston DASEN, PA-C where he did not have any complaints, but noted injection site bruising.   Today, he says that he recently had his stem cells collected, but has not proceeded with a transplant - this was a difficult process as he required multiple draws. He endorses his carpal tunnel has been bother him significantly with more pain in the wrist, but no weakness. He has been using splints during the day. Previously saw Dr. Kevin Kuzma where he received an injection.  Has also been experiencing hot flashes, especially at night.   Denies coughing, illnesses, sore throat, diarrhea. He states that his last Dara treatment was on 10/03 with his last Revlimid  at the end of September.  He reportedly has also seen Urology for an elevated PSA around 5 - monitoring suggested. Denies new urinary symptoms. Says that his prostate was apparently smooth and not enlarged.  REVIEW OF SYSTEMS:   10 Point review of systems of done and is negative except as noted above.  MEDICAL HISTORY Past Medical History:  Diagnosis Date   Anemia    History of hiatal hernia    Multiple  myeloma (HCC)    Pneumonia     SURGICAL HISTORY Past Surgical History:  Procedure Laterality Date   COLONOSCOPY  09/19/2021   HERNIA REPAIR      SOCIAL HISTORY Social History   Tobacco Use   Smoking status: Never   Smokeless tobacco: Never  Vaping Use   Vaping status: Never Used  Substance Use Topics   Alcohol use: Not Currently   Drug use: No    Social History   Social History Narrative   Not on file    SOCIAL DRIVERS OF HEALTH SDOH Screenings   Food Insecurity: Low Risk  (10/16/2023)   Received from [facility]  Housing: Unknown (02/11/2024)   Received from [facility]  Transportation Needs: No Transportation Needs (10/16/2023)   Received from [facility]  Utilities: Low Risk  (10/16/2023)   Received from [facility]  Alcohol Screen: Low Risk  (07/01/2023)  Depression (PHQ2-9): Low Risk  (05/15/2024)  Financial Resource Strain: Low Risk  (  07/01/2023)  Physical Activity: Sufficiently Active (07/01/2023)  Social Connections: Socially Integrated (07/01/2023)  Stress: No Stress Concern Present (07/01/2023)  Tobacco Use: Low Risk  (07/21/2024)  Health Literacy: Adequate Health Literacy (07/01/2023)     FAMILY HISTORY No family history on file.   ALLERGIES: is allergic to amoxicillin.  MEDICATIONS  Current Outpatient Medications  Medication Sig Dispense Refill   acyclovir  (ZOVIRAX ) 400 MG tablet Take 1 tablet (400 mg total) by mouth 2 (two) times daily. 60 tablet 5   aspirin  EC 81 MG tablet Take 1 tablet (81 mg total) by mouth daily. 100 tablet 3   CYANOCOBALAMIN  SL Take 1 tablet by mouth once a week.     lenalidomide  (REVLIMID ) 15 MG capsule Take 1 capsule (15 mg total) by mouth daily. Take 1 capsule (15 mg total) by mouth daily for 21 days. Take 7 days off and repeat cycle. (Patient not taking: No sig reported) 21 capsule 0   ondansetron  (ZOFRAN ) 8 MG tablet Take 1 tablet (8 mg total) by mouth every 8 (eight) hours as needed for nausea or  vomiting. (Patient not taking: Reported on 07/16/2024) 30 tablet 1   predniSONE  (STERAPRED UNI-PAK 21 TAB) 10 MG (21) TBPK tablet Per instructions (6,5,4,3,2,1) (Patient not taking: Reported on 07/16/2024) 21 tablet 0   prochlorperazine  (COMPAZINE ) 10 MG tablet Take 1 tablet (10 mg total) by mouth every 6 (six) hours as needed for nausea or vomiting. (Patient not taking: Reported on 07/16/2024) 30 tablet 1   No current facility-administered medications for this visit.    PHYSICAL EXAMINATION: ECOG PERFORMANCE STATUS: 1 - Symptomatic but completely ambulatory VITALS: Vitals:   07/23/24 1135  BP: 121/74  Pulse: 64  Resp: 16  Temp: 98.1 F (36.7 C)  SpO2: 98%   Filed Weights   07/23/24 1135  Weight: 170 lb 9.6 oz (77.4 kg)   Body mass index is 23.79 kg/m.  GENERAL: alert, in no acute distress and comfortable SKIN: no acute rashes, no significant lesions EYES: conjunctiva are pink and non-injected, sclera anicteric OROPHARYNX: MMM, no exudates, no oropharyngeal erythema or ulceration NECK: supple, no JVD LYMPH:  no palpable lymphadenopathy in the cervical, axillary or inguinal regions LUNGS: clear to auscultation b/l with normal respiratory effort HEART: regular rate & rhythm ABDOMEN:  normoactive bowel sounds , non tender, not distended, no hepatosplenomegaly Extremity: no pedal edema PSYCH: alert & oriented x 3 with fluent speech NEURO: no focal motor/sensory deficits  LABORATORY DATA:   I have reviewed the data as listed     Latest Ref Rng & Units 07/23/2024   11:00 AM 06/12/2024    8:24 AM 05/29/2024   10:09 AM  CBC EXTENDED  WBC 4.0 - 10.5 K/uL 5.7  2.4  4.3   RBC 4.22 - 5.81 MIL/uL 3.62  3.74  3.74   Hemoglobin 13.0 - 17.0 g/dL 88.2  87.8  87.9   HCT 39.0 - 52.0 % 34.8  35.3  35.4   Platelets 150 - 400 K/uL 306  190  229   NEUT# 1.7 - 7.7 K/uL 4.8  0.7  2.4   Lymph# 0.7 - 4.0 K/uL 0.5  1.1  1.1       Latest Ref Rng & Units 07/23/2024   11:00 AM 06/12/2024     8:24 AM 05/29/2024   10:09 AM  CMP  Glucose 70 - 99 mg/dL 898  852  898   BUN 8 - 23 mg/dL 22  18  17    Creatinine 0.61 -  1.24 mg/dL 9.04  8.89  9.03   Sodium 135 - 145 mmol/L 144  142  141   Potassium 3.5 - 5.1 mmol/L 4.5  4.4  4.5   Chloride 98 - 111 mmol/L 112  109  109   CO2 22 - 32 mmol/L 27  30  29    Calcium 8.9 - 10.3 mg/dL 8.9  9.1  8.4   Total Protein 6.5 - 8.1 g/dL 5.7  5.9  5.7   Total Bilirubin 0.0 - 1.2 mg/dL 0.5  0.5  0.6   Alkaline Phos 38 - 126 U/L 55  46  43   AST 15 - 41 U/L 13  20  13    ALT 0 - 44 U/L 10  21  13     Bone Marrow Evaluation 06/16/2024  Correction History   AMENDED REPORT   06/18/24   This report is amended to correct a typographical error in the final diagnosis (plasma neoplasm changed to plasma cell neoplasm). Previously, the final diagnosis was listed as:    Bone marrow, left iliac crest, aspirate, clot, and core biopsy:  -Cellular bone marrow with involvement by a plasma neoplasm (10% plasma cells by CD138 IHC analysis; kappa-restricted).  -See comment   The final diagnosis has been changed to:   Bone marrow, left iliac crest, aspirate, clot, and core biopsy:  -Cellular bone marrow with involvement by a plasma cell neoplasm (10% plasma cells by CD138 IHC analysis; kappa-restricted).  -See comment   The results of this case were Epic-messaged to Dr. Fernande on 06/18/2024 at 2:24 pm by Dr. Harriette.   Final Diagnosis   Bone marrow, left iliac crest, aspirate, clot, and core biopsy:  -Cellular bone marrow with involvement by a plasma cell neoplasm (10% plasma cells by CD138 IHC analysis; kappa-restricted).  -See comment  Comment: Corrected result: Previously reported on 06/18/2024 at 1421 EDT.  Amendment electronically signed by Suzen Harriette, MD on 06/18/2024 at 1427 EDT Electronically signed by Suzen Harriette, MD on 06/18/2024 at 1421 EDT  Attestation Statement   I verify that I have personally reviewed all relevant slides/materials  for this case and rendered or confirmed the diagnosis.  Comment   Correlation with results of concurrent minimal residual disease (MRD) flow cytometry, cytogenetics, and molecular studies is recommended.  Clinical Information   Multiple Myeloma  Bone Marrow Differential   Cell Type Value Reference Range  Blast % 1 0-3  Promyelocyte % 0 1-8  Myelocyte % 1 13-22  Metamyelocyte % 4 13-22  Neutrophil % 19 7-30  Eosinophil % 3 1-6  Basophil % 0 0-2  Monocyte % 5 1-3  Erythrocyte % 44 14-30  Lymphocyte % 18 3-24  Plasma Cell % 3 1-3  Other Cells % 0    Total Counted 300    M/E Ratio 0.8 1.2-5.0  Megakaryocytes Present       Microscopic Description   Peripheral blood smear:   See CBC data.   Platelets: within normal limits and morphologically unremarkable Red blood cells: normocytic anemia, mild anisopoikilocytosis, and few ovalocytes White blood cells: leukopenia with neutropenia   Bone marrow aspirate/Touch Preparation:   Quality: Cellular aspirate smears are received for evaluation.   Granulopoiesis: Decreased, orderly maturation Erythropoiesis: Increased, orderly maturation Megakaryopoeisis: Present. Blasts: No increase in blasts.  Other: Rare scattered plasma cells identified.   The touch preparation of the bone marrow biopsy shows predominantly blood with few scattered cells.    Bone Marrow Clot and Biopsy: Bone Marrow Clot Cellularity: 15%  Bone Marrow Biopsy Cellularity: See below Microscopic Description: Sections of clot show a cellular marrow with trilineage hematopoiesis. Scattered variably-sized megakaryocytes are noted. The bone marrow biopsy consists of soft tissue and bone. A focus of marrow is noted and shows trilineage hematopoiesis.  The results of immunohistochemistry on the biopsy specimen show:    Block Stain Result  B1-3 CD138 Positiv in few scattered plasma cells (approximately 10% of marrow cells).   B1-4 Kappa/Lambda IHC  Shows a monotypic kappa  staining pattern.     The positive control slides stain appropriately.   Gross Description   B. SPECIMEN ID:  Patient name, medical record number, bone marrow clot DESCRIPTION: 2.6 x 1.7 x 0.3 cm of coagulated blood.   B1             submitted entirely C. SPECIMEN ID:  Patient name, medical record number, bone marrow biopsy DESCRIPTION: 2 core(s) of bone, 0.3 cm and 0.3 cm.   C1             submitted entirely after CalFor decalcification         07/17/2023 Surgical pathology:    07/17/2023 Surgical pathology:      RADIOGRAPHIC STUDIES: I have personally reviewed the radiological images as listed and agreed with the findings in the report. No results found.   PET/CT Whole Body 06/15/2024  Impression  CONCLUSION: Compared to FDG PET/CT 07/26/2023: 1.  Interval decrease in FDG avidity of previously seen hypermetabolic lymph nodes above and below the diaphragm. There is a new left periclavicular lymph node with mild FDG uptake. 2.  Resolution of the previously seen hypermetabolic osseous lesions and background bone marrow uptake. No new FDG avid or lytic osseous lesion. Narrative  PET/CT WHOLE BODY, 06/15/2024 4:01 PM  INDICATION: Pre ASCT eval;  Multiple Myeloma, Hematologic malignancy, assess treatment response, Pre stem cell transplant evaluation, Encounter for other preprocedural examination \ \ Z01.818 Encounter for other preprocedural examination \ C90.00 Multiple myeloma not having achieved remission    (CMD) \ Z52.011 Autologous donor, stem cells Pre ASCT eval;  Multiple Myeloma ADDITIONAL HISTORY: As above COMPARISON: FDG PET/CT 07/26/2023  TECHNIQUE: 57 minutes after the intravenous injection of 12.09 mCi F-18 FDG, PET imaging was obtained from the skull vertex through the feet. These images were then attenuation corrected using low-dose CT and co-registered for anatomic localization. Standardized uptake values (SUV) were calculated using a lean body mass  algorithm. Blood glucose at the time of injection was 84 mg/dL.  All CT scans at [facility] are performed using radiation dose optimization techniques as appropriate to a performed exam, including but not limited to one or more of the following: automatic exposure control, adjustment of the mA and/or kV according to patient size, use of iterative reconstruction technique. In addition, our institution participates in a radiation dose monitoring program to optimize patient radiation exposure.  SUVmax of blood pool: 2 SUVmax of liver: 2.2  LIMITATIONS: None  FINDINGS:  DISEASE-RELATED FINDINGS: Less conspicuous appearance of the mediastinal and supraclavicular hypermetabolic lymphadenopathy, however there is a new left periclavicular lymph node seen on image 161 with SUV max 5. Right upper paratracheal lymph node on image 175 with SUV max 4.2, previously SUV max 7.6. Mildly FDG avid subcarinal lymph node on image 197 with SUV max 3.5, previously SUV max 13.  Minimal background FDG uptake with scattered soft tissue densities in bilateral iliac bones, which favors bone marrow hyperplasia. No focal FDG  avid lesion is visualized. Interval resolution of previously seen focal FDG uptake in the right scapula, left eccentric manubrium and sternum. There is also resolution of previously seen prominent FDG uptake in the background bone marrow.  Interval decrease in conspicuity of previously seen peripancreatic lymphadenopathy. Normal size of the spleen without abnormal FDG uptake  OTHER TRACER-RELATED FINDINGS: Expected physiologic activity within the kidneys, ureter, bladder, oropharynx, salivary glands, bowel, and brain.   ANCILLARY CT FINDINGS: Evaluation of the lower thighs and legs reveals no additional contributory findings. Minimal LAD calcifications Right apical calcified pulmonary granuloma measuring 0.3 cm Again seen prostatomegaly and  bladder wall thickening.    ASSESSMENT & PLAN:  73 y.o. male with  #IgG Lambda  Multiple myeloma -Kappa light chain producing. -BMBx from 07/17/2023 showed two plasmacytoid populations, one kappa and one lambda restricted. The predominant clone is CD20 positive and CD5 and CD10 negative.  - Patient started Dara/Rev/Dex on 11/07/2023.  - Revlimid  was dose reduced from 25 mg to 15 mg on 12/23/2023 due to cytopenias.   PLAN: - Discussed lab results on 07/23/2024 in detail with patient: CBC showed WBC of 5.7K, Hemoglobin of 11.7 decreased from 12.1, and PLTs of 306K. CMP stable.   Myeloma Panel pending.   - Reviewed 06/15/2024 PET scan:  1.  Interval decrease in FDG avidity of previously seen hypermetabolic lymph nodes above and below the diaphragm. There is a new left periclavicular lymph node with mild FDG uptake. 2.  Resolution of the previously seen hypermetabolic osseous lesions and background bone marrow uptake. No new FDG avid or lytic osseous lesion.  - Discussed possibly undergoing carpal tunnel repair while he is on a medication break if his pain is significant.   - Vaccine counseling provided: avoid updating vaccines for at least 2 weeks post-op.  - Hold Dara and Revlimid  for surgery-- he is scheduled for inguinal hernia repair on 07/28/2024.. will plan to restart Dara/Rev/Dex with ASA in 4-5 weeks post operatively - Continue Acyclovir   FOLLOW-UP   Plz schedule to restart Dara faspro in 4 weeks with labs and MD visit   The total time spent in the appointment was 30 minutes* .  All of the patient's questions were answered and the patient knows to call the clinic with any problems, questions, or concerns.  Emaline Saran MD MS AAHIVMS [facility] Hematology/Oncology Physician [facility]  *Total Encounter Time as defined by the Centers for Medicare and Medicaid Services includes, in addition to the face-to-face time of a patient visit (documented in the note above)  non-face-to-face time: obtaining and reviewing outside history, ordering and reviewing medications, tests or procedures, care coordination (communications with other health care professionals or caregivers) and documentation in the medical record.  I,Emily Lagle,acting as a neurosurgeon for Emaline Saran, MD.,have documented all relevant documentation on the behalf of Emaline Saran, MD,as directed by  Emaline Saran, MD while in the presence of Emaline Saran, MD.  I have reviewed the above documentation for accuracy and completeness, and I agree with the above.  Menna Abeln, MD

## 2024-07-22 NOTE — Progress Notes (Addendum)
 Anesthesia Chart Review:  Case: [id number] Date/Time: 07/28/24 1404   Procedure: REPAIR, HERNIA, INGUINAL, LAPAROSCOPIC (Right) - LAPAROSCOPIC RIGHT INGUINAL HERNIA REPAIR WITH MESH   Anesthesia type: General   Pre-op diagnosis: RIGHT INGUINAL HERNIA   Location: [facility] ROOM 10 / [facility]   Surgeons: Colin Calamity, MD       DISCUSSION: Patient is a 73 year old male scheduled for the above procedure.   History includes never smoker, multiple myeloma (MM IgG Lambda, diagnosed 07/17/2023, anemia, hiatal hernia.    He was diagnosed with multiple myeloma in 07/2024. Per [facility] neurology notes, he had known chronic macrocytic anemia attributed to vitamin B12 deficiency. He saw neurology for neuropathy and work-up revealed abnormal light chain ration and positive M protein ((K 590, L 3, ratio 185). Due to abnormal findings, bone marrow biopsy was performed which showed two plasmacytoid populations, one kappa and one lambda restricted. The predominant clone is CD20 positive and CD5 and CD10 negative. He was started on treatment with D-Rd (Rara/Rev/Dex) on 11/07/2023 with some issues with cyptopenias, neutropenia. Velcade held due to concern for carpel tunnel/neuropathy. He had what appeared to be VGPR (very good partial response). As of 07/01/2024,he was not interested in proceeding with transplant but is willing to move forward with harvest and hold. Revlimid  continued. He was referred for mobilization, collection, and storage of stem cells.   He received:  Nivestym 780 mcg on 07/04/2024, 07/05/2024, 07/06/2024. Mozobil 07/07/2024, 07/08/2024, 07/09/2024  - Right internal jugular Hickman placement for autologous stem cell treatment 07/08/2024 and removed on 07/10/2024 after completed day 3 of a(n) Autologous hematopoietic cell (HPC) collection.   - 07/08/2024: Completed day 1 of a(n) Autologous hematopoietic cell (HPC) collection. Donor suitability confirmed per protocol before initiating the  procedure.SABRASABRASABRACalcium gluconate was given for prevention of symptoms of hypocalcemia . He had mild symptoms of chills. Post-collection vital signs were stable.   - 07/09/2024: Completed day 2 of a(n) Autologous hematopoietic cell (HPC) collection. Donor suitability confirmed per protocol before initiating the procedure.SABRASABRASABRACalcium gluconate and calcium carbonate was given for prevention of symptoms of hypocalcemia tingling around the mouth, chills, which resolved with calcium gluconate administration. He had mild symptoms of chills and lip tingling. Post-collection vital signs were stable.    - 07/10/2024: Completed day 3 of a(n) Autologous hematopoietic cell (HPC) collection. Donor suitability confirmed per protocol before initiating the procedure. Pt given ordered dose of filgrastim prior to procedure start.SABRASABRASABRACalcium gluconate given for prevention of hypocalcemia. Pt also given IV magnesium and PO potassium to keep electrolyte balance. Post-collection vital signs were stable.   Summary: 07/08/24 Pre: 0.971k89^4 Post: 1.1x10^6 in 3 bags 07/09/24 Pre: 8.975k89^3 Post: 2.019x10^6 in 4 bags 07/10/24 Pre: 9.35x10^5 Post: 6.75x10^5 in 3 bags Total Pre: 2.86x10^6 in 10 bags  Total Post: 3.79x10^6 in 10 bags   Per 06/16/2024 Progress Note by Dr. Rubin, He was diagnosed with multiple myeloma late last year and began treatment with Revlimid  and panitumumab. Treatment is currently on hold for stem cell harvesting and potential hernia surgery.   CCS advised hold ASA for 5 days.   Unclear if he received recommendations from oncology regarding Revlimid  perioperatively. He has scheduled follow-up with Dr. Onesimo on 07/23/2024, so will follow-up notes. I believe he is getting repeat labs as well.   Chart will be left for follow-up.   ADDENDUM 07/27/2024 9:29 AM: 07/23/2024 oncology Progress Note is still pended. CBC with diff and CMP results are available, but 07/23/2024 Multiple Myeloma Panel and  Kappa/lambda light  chains are still in process.  Currently available results from 07/23/2024 show Cr 0.95, glucose 101, Na 144, K 4.5, albumin 4.2, AST 13, ALT 10, total Protein 5.7, WBC 5.7, H/H 11.7/34.8, PLT 306K. Per pended note, noted that [name] had stem cells collected but had not yet undergone stem cell transplant. He reported last Dara treatment 06/12/2024 and last Revlimid  was at the end of September. 06/15/2025 PET scan reviewed and showed interval decrease in FDG activity of previously hypermetabolic lymph nodes above and below the diaphragm with a new left pericanalicular LN with mild FDG uptake, and resolution of the previous hypermetabolic osseous lesions and background bone marrow uptake with no new FDG avid or lytic osseous lesions. Note suggests discussion of possibly undergoing carpal tunnel repair while on medication break if pain was significant. Dara and Revlimid  continued to be held, but acyclovir  continued.   Anesthesia team to evaluate on the day of surgery.    VS: BP 123/68   Pulse 73   Temp 36.6 C   Resp 17   Ht 5' 11 (1.803 m)   Wt 76.7 kg   SpO2 100%   BMI 23.57 kg/m    PROVIDERS: Colin Leech, MD is PCP Colin Emaline Brink, MD is HEM-ONC with [facility] and Colin Rush, MD is HEM-ONC with [facility].    LABS: Most recent labs in [facility] include: Lab Results  Component Value Date   WBC 2.4 (L) 06/12/2024   HGB 12.1 (L) 06/12/2024   HCT 35.3 (L) 06/12/2024   PLT 190 06/12/2024   GLUCOSE 147 (H) 06/12/2024   ALT 21 06/12/2024   AST 20 06/12/2024   NA 142 06/12/2024   K 4.4 06/12/2024   CL 109 06/12/2024   CREATININE 1.10 06/12/2024   BUN 18 06/12/2024   CO2 30 06/12/2024   INR 1.1 12/27/2023   Updated CBC and CMP results from 07/23/2024 include: Cr 0.95, glucose 101, Na 144, K 4.5, albumin 4.2, AST 13, ALT 10, total Protein 5.7, WBC 5.7, H/H 11.7/34.8, PLT 306K.    OTHER: Spirometry 06/15/2024 ([facility]): Spirometry demonstrates  normal airflow, without obstruction. Lung volume measurement was not performed, but normal FVC and FEV1-FVC are highly reliable at ruling out restriction. The flow-volume loop appears normal.    IMAGES: CXR 06/15/2024 ([facility]): FINDINGS:  Cardiovascular: Cardiac silhouette and pulmonary vasculature are within normal limits.  Mediastinum: Within normal limits.  Lungs/pleura: Clear.  Upper abdomen: Visualized portions are unremarkable.  Chest wall/osseous structures: Unremarkable.  IMPRESSION:  There is no evidence of acute cardiac or pulmonary abnormality.  PET/CT Whole Body 06/15/2024 ([facility]): CONCLUSION:  Compared to FDG PET/CT 07/26/2023:  1.  Interval decrease in FDG avidity of previously seen hypermetabolic lymph nodes above and below the diaphragm. There is a new left periclavicular lymph node with mild FDG uptake.  2.  Resolution of the previously seen hypermetabolic osseous lesions and background bone marrow uptake. No new FDG avid or lytic osseous lesion.    EKG: EKG 06/15/2024 Sinus bradycardia at 48 bpm  Left anterior fascicular block  No previous ECGs available  Confirmed by fellow Francoise Faden  [id number]  on 06-15-2024 12 00 55 PM  Confirmed by Marcine Grice  [id number]  on 06-15-2024 1 41 16 PM    CV: Echo 06/15/2024 ([facility]): SUMMARY  The left ventricular size is normal.  Left ventricular systolic function is normal.  LV ejection fraction = 55-60%.  The right ventricle is borderline dilated.  The  right ventricular systolic function is normal.  Moderately dilated, non-collapsible IVC consistent with an elevated right atrial pressure.  There was insufficient TR detected to calculate RV systolic pressure.  There is no comparison study available.  US  Carotid 02/18/2018: IMPRESSION: - Minor carotid atherosclerosis and intimal thickening. No hemodynamically significant ICA stenosis. Degree of narrowing less than 50% bilaterally by ultrasound criteria. - Patent antegrade  vertebral flow bilaterally    Past Medical History:  Diagnosis Date   Anemia    History of hiatal hernia    Multiple myeloma (HCC)    Pneumonia     Past Surgical History:  Procedure Laterality Date   COLONOSCOPY  09/19/2021   HERNIA REPAIR      MEDICATIONS:  acyclovir  (ZOVIRAX ) 400 MG tablet   aspirin  EC 81 MG tablet   CYANOCOBALAMIN  SL   lenalidomide  (REVLIMID ) 15 MG capsule   ondansetron  (ZOFRAN ) 8 MG tablet   predniSONE  (STERAPRED UNI-PAK 21 TAB) 10 MG (21) TBPK tablet   prochlorperazine  (COMPAZINE ) 10 MG tablet   No current facility-administered medications for this encounter.    Colin Ruder, Colin Calderon Surgical Short Stay/Anesthesiology [facility] Phone [phone] [facility] Phone [phone] 07/23/2024 10:12 AM

## 2024-07-23 ENCOUNTER — Inpatient Hospital Stay (HOSPITAL_BASED_OUTPATIENT_CLINIC_OR_DEPARTMENT_OTHER): Admitting: Hematology

## 2024-07-23 ENCOUNTER — Other Ambulatory Visit: Payer: Self-pay

## 2024-07-23 ENCOUNTER — Inpatient Hospital Stay: Attending: Oncology

## 2024-07-23 VITALS — BP 121/74 | HR 64 | Temp 98.1°F | Resp 16 | Wt 170.6 lb

## 2024-07-23 DIAGNOSIS — Z7961 Long term (current) use of immunomodulator: Secondary | ICD-10-CM | POA: Diagnosis not present

## 2024-07-23 DIAGNOSIS — D649 Anemia, unspecified: Secondary | ICD-10-CM | POA: Insufficient documentation

## 2024-07-23 DIAGNOSIS — C9000 Multiple myeloma not having achieved remission: Secondary | ICD-10-CM | POA: Insufficient documentation

## 2024-07-23 DIAGNOSIS — Z5111 Encounter for antineoplastic chemotherapy: Secondary | ICD-10-CM

## 2024-07-23 DIAGNOSIS — Z7982 Long term (current) use of aspirin: Secondary | ICD-10-CM | POA: Diagnosis not present

## 2024-07-23 DIAGNOSIS — Z79624 Long term (current) use of inhibitors of nucleotide synthesis: Secondary | ICD-10-CM | POA: Insufficient documentation

## 2024-07-23 LAB — CMP (CANCER CENTER ONLY)
ALT: 10 U/L (ref 0–44)
AST: 13 U/L — ABNORMAL LOW (ref 15–41)
Albumin: 4.2 g/dL (ref 3.5–5.0)
Alkaline Phosphatase: 55 U/L (ref 38–126)
Anion gap: 5 (ref 5–15)
BUN: 22 mg/dL (ref 8–23)
CO2: 27 mmol/L (ref 22–32)
Calcium: 8.9 mg/dL (ref 8.9–10.3)
Chloride: 112 mmol/L — ABNORMAL HIGH (ref 98–111)
Creatinine: 0.95 mg/dL (ref 0.61–1.24)
GFR, Estimated: >60 mL/min (ref >60)
Glucose, Bld: 101 mg/dL — ABNORMAL HIGH (ref 70–99)
Potassium: 4.5 mmol/L (ref 3.5–5.1)
Sodium: 144 mmol/L (ref 135–145)
Total Bilirubin: 0.5 mg/dL (ref 0.0–1.2)
Total Protein: 5.7 g/dL — ABNORMAL LOW (ref 6.5–8.1)

## 2024-07-23 LAB — CBC WITH DIFFERENTIAL (CANCER CENTER ONLY)
Abs Immature Granulocytes: 0.01 K/uL (ref 0.00–0.07)
Basophils Absolute: 0 K/uL (ref 0.0–0.1)
Basophils Relative: 1 %
Eosinophils Absolute: 0.1 K/uL (ref 0.0–0.5)
Eosinophils Relative: 1 %
HCT: 34.8 % — ABNORMAL LOW (ref 39.0–52.0)
Hemoglobin: 11.7 g/dL — ABNORMAL LOW (ref 13.0–17.0)
Immature Granulocytes: 0 %
Lymphocytes Relative: 8 %
Lymphs Abs: 0.5 K/uL — ABNORMAL LOW (ref 0.7–4.0)
MCH: 32.3 pg (ref 26.0–34.0)
MCHC: 33.6 g/dL (ref 30.0–36.0)
MCV: 96.1 fL (ref 80.0–100.0)
Monocytes Absolute: 0.4 K/uL (ref 0.1–1.0)
Monocytes Relative: 7 %
Neutro Abs: 4.8 K/uL (ref 1.7–7.7)
Neutrophils Relative %: 83 %
Platelet Count: 306 K/uL (ref 150–400)
RBC: 3.62 MIL/uL — ABNORMAL LOW (ref 4.22–5.81)
RDW: 14.6 % (ref 11.5–15.5)
WBC Count: 5.7 K/uL (ref 4.0–10.5)
nRBC: 0 % (ref 0.0–0.2)

## 2024-07-24 ENCOUNTER — Ambulatory Visit: Admitting: Physician Assistant

## 2024-07-24 ENCOUNTER — Inpatient Hospital Stay

## 2024-07-24 ENCOUNTER — Other Ambulatory Visit

## 2024-07-24 DIAGNOSIS — R519 Headache, unspecified: Secondary | ICD-10-CM | POA: Diagnosis not present

## 2024-07-27 LAB — MULTIPLE MYELOMA PANEL, SERUM
Albumin SerPl Elph-Mcnc: 3.8 g/dL (ref 2.9–4.4)
Albumin/Glob SerPl: 2.2 — ABNORMAL HIGH (ref 0.7–1.7)
Alpha 1: 0.2 g/dL (ref 0.0–0.4)
Alpha2 Glob SerPl Elph-Mcnc: 0.6 g/dL (ref 0.4–1.0)
B-Globulin SerPl Elph-Mcnc: 0.7 g/dL (ref 0.7–1.3)
Gamma Glob SerPl Elph-Mcnc: 0.2 g/dL — ABNORMAL LOW (ref 0.4–1.8)
Globulin, Total: 1.8 g/dL — ABNORMAL LOW (ref 2.2–3.9)
IgA: 6 mg/dL — ABNORMAL LOW (ref 61–437)
IgG (Immunoglobin G), Serum: 268 mg/dL — ABNORMAL LOW (ref 603–1613)
IgM (Immunoglobulin M), Srm: <5 mg/dL — ABNORMAL LOW (ref 15–143)
M Protein SerPl Elph-Mcnc: 0.1 g/dL — ABNORMAL HIGH
Total Protein ELP: 5.6 g/dL — ABNORMAL LOW (ref 6.0–8.5)

## 2024-07-27 LAB — KAPPA/LAMBDA LIGHT CHAINS
Kappa free light chain: 126.8 mg/L — ABNORMAL HIGH (ref 3.3–19.4)
Kappa, lambda light chain ratio: 74.59 — ABNORMAL HIGH (ref 0.26–1.65)
Lambda free light chains: 1.7 mg/L — ABNORMAL LOW (ref 5.7–26.3)

## 2024-07-27 NOTE — Anesthesia Preprocedure Evaluation (Signed)
Anesthesia Evaluation  Patient identified by MRN, date of birth, ID band Patient awake    Reviewed: Allergy & Precautions, NPO status , Patient's Chart, lab work & pertinent test results  History of Anesthesia Complications Negative for: history of anesthetic complications  Airway Mallampati: II  TM Distance: >3 FB Neck ROM: Full   Comment: Previous grade I view with MAC 3, easy mask Dental  (+) Dental Advisory Given   Pulmonary neg shortness of breath, asthma , neg sleep apnea, neg COPD, neg recent URI   Pulmonary exam normal breath sounds clear to auscultation       Cardiovascular (-) hypertension(-) angina (-) Past MI and (-) Cardiac Stents + dysrhythmias (s/p ablation) Supra Ventricular Tachycardia + Valvular Problems/Murmurs (trivial)  Rhythm:Regular Rate:Normal  Echo 08/14/19 ([facility]): INTERPRETATION  NORMAL LEFT VENTRICULAR SYSTOLIC FUNCTION  NORMAL RIGHT VENTRICULAR SYSTOLIC FUNCTION  MILD VALVULAR REGURGITATION (trivial AR/MR/TR/PR)  NO VALVULAR STENOSIS     Neuro/Psych  Headaches, neg Seizures    GI/Hepatic Neg liver ROS,GERD  ,,  Endo/Other  negative endocrine ROS    Renal/GU negative Renal ROS     Musculoskeletal  (+) Arthritis , Osteoarthritis,    Abdominal   Peds  Hematology  (+) Blood dyscrasia, anemia   Anesthesia Other Findings Lupus, mast cell activation syndrome, Sjogren's syndrome, Ehlers-Danlos syndrome, POTS  Reproductive/Obstetrics                             Anesthesia Physical Anesthesia Plan  ASA: 3  Anesthesia Plan: General   Post-op Pain Management: Tylenol PO (pre-op)*   Induction: Intravenous  PONV Risk Score and Plan: 3 and Dexamethasone, Treatment may vary due to age or medical condition and Midazolam  Airway Management Planned: Oral ETT  Additional Equipment:   Intra-op Plan:   Post-operative Plan: Extubation in OR  Informed Consent: I  have reviewed the patients History and Physical, chart, labs and discussed the procedure including the risks, benefits and alternatives for the proposed anesthesia with the patient or authorized representative who has indicated his/her understanding and acceptance.     Dental advisory given  Plan Discussed with: CRNA and Anesthesiologist  Anesthesia Plan Comments: (Patient had sore throat this weekend (now resolved). Patient's son tested positive for strep on Friday (5/3). Patient has been afebrile. Rapid strep test in preop is negative.  Patient okay with fentanyl while asleep but does not want fentanyl while awake. She would like to avoid Zofran, but is okay with a one time dose in PACU if needed.  PAT note written by Shonna Chock, PA-C.  Risks of general anesthesia discussed including, but not limited to, sore throat, hoarse voice, chipped/damaged teeth, injury to vocal cords, nausea and vomiting, allergic reactions, lung infection, heart attack, stroke, and death. All questions answered.   )       Anesthesia Quick Evaluation

## 2024-07-27 NOTE — H&P (Signed)
 Chief Complaint: New Problem  (Inguinal hernia w/o obst or gang)       History of Present Illness: Colin Calderon is a 73 y.o. male who is seen today as an office consultation at the request of Dr. Paola for evaluation of New Problem  (Inguinal hernia w/o obst or gang) .   History of Present Illness Colin Calderon is a 73 year old male with multiple myeloma who presents with a right-sided hernia. He is accompanied by his wife, Neville. He was referred by his oncology team for coordination of hernia repair surgery.   He experiences discomfort from a right-sided hernia located low in the abdomen, particularly when standing for extended periods. He had a left-sided hernia repaired laparoscopically 12 years ago. He remains active by walking, avoiding swimming and biking.   He was diagnosed with multiple myeloma late last year and began treatment with Revlimid  and panitumumab. Treatment is currently on hold for stem cell harvesting and potential hernia surgery.   He has no history of heart attacks, diabetes, or dialysis. He is not on blood thinners or a pacemaker. Sleep apnea testing was not problematic, though he snores. He is not allergic to latex.       Review of Systems: A complete review of systems was obtained from the patient.  I have reviewed this information and discussed as appropriate with the patient.  See HPI as well for other ROS.   Review of Systems  Constitutional:  Negative for fever.  HENT:  Negative for congestion.   Eyes:  Negative for blurred vision.  Respiratory:  Negative for cough, shortness of breath and wheezing.   Cardiovascular:  Negative for chest pain and palpitations.  Gastrointestinal:  Negative for heartburn.  Genitourinary:  Negative for dysuria.  Musculoskeletal:  Negative for myalgias.  Skin:  Negative for rash.  Neurological:  Negative for dizziness and headaches.  Psychiatric/Behavioral:  Negative for depression and suicidal ideas.   All other  systems reviewed and are negative.       Medical History:     Past Medical History:  Diagnosis Date   History of cancer     Sleep apnea        There is no problem list on file for this patient.          Past Surgical History:  Procedure Laterality Date   HERNIA REPAIR              Allergies  Allergen Reactions   Amoxicillin Itching and Other (See Comments)            Current Outpatient Medications on File Prior to Visit  Medication Sig Dispense Refill   acyclovir  (ZOVIRAX ) 400 MG tablet Take 400 mg by mouth 2 (two) times daily       aspirin  81 MG EC tablet Take 81 mg by mouth once daily       azithromycin  (ZITHROMAX ) 250 MG tablet Take 250 mg by mouth       cyanocobalamin , vitamin B-12, 5,000 mcg/mL Drop 1 tablet       cyanocobalamin /folic acid  (VITAMIN B12-FOLIC ACID ) 1,000-400 mcg Lozg Take 1 tablet by mouth once daily       dexAMETHasone  (DECADRON ) 4 MG tablet Take 5 tabs (20 mg) weekly the day after daratumumab  for 12 weeks. Take with breakfast.       lenalidomide  (REVLIMID ) 15 mg capsule Take 1 capsule by mouth once daily for 21 days, then 7 days off.  No current facility-administered medications on file prior to visit.           Family History  Problem Relation Age of Onset   High blood pressure (Hypertension) Mother     Coronary Artery Disease (Blocked arteries around heart) Father        Social History       Tobacco Use  Smoking Status Never  Smokeless Tobacco Never      Social History        Socioeconomic History   Marital status: Married  Tobacco Use   Smoking status: Never   Smokeless tobacco: Never  Vaping Use   Vaping status: Never Used  Substance and Sexual Activity   Alcohol use: Not Currently   Drug use: Never   Sexual activity: Defer    Social Drivers of Health        Financial Resource Strain: Low Risk  (07/01/2023)    Received from [facility]    Overall Financial Resource Strain (CARDIA)     Difficulty of Paying  Living Expenses: Not hard at all  Food Insecurity: Low Risk  (10/16/2023)    Received from [facility]    Hunger Vital Sign     Within the past 12 months, you worried that your food would run out before you got money to buy more: Never true     Within the past 12 months, the food you bought just didn't last and you didn't have money to get more. : Never true  Transportation Needs: No Transportation Needs (10/16/2023)    Received from [employer]     In the past 12 months, has lack of reliable transportation kept you from medical appointments, meetings, work or from getting things needed for daily living? : No  Physical Activity: Sufficiently Active (07/01/2023)    Received from [facility]    Exercise Vital Sign     On average, how many days per week do you engage in moderate to strenuous exercise (like a brisk walk)?: 7 days     On average, how many minutes do you engage in exercise at this level?: 40 min  Stress: No Stress Concern Present (07/01/2023)    Received from [facility] of Occupational Health - Occupational Stress Questionnaire     Feeling of Stress : Not at all  Social Connections: Socially Integrated (07/01/2023)    Received from [facility]    Social Connection and Isolation Panel     How often do you get together with friends or relatives?: Three times a week     How often do you attend church or religious services?: More than 4 times per year     Do you belong to any clubs or organizations such as church groups, unions, fraternal or athletic groups, or school groups?: Yes     How often do you attend meetings of the clubs or organizations you belong to?: More than 4 times per year     Are you married, widowed, divorced, separated, never married, or living with a partner?: Married  Housing Stability: Unknown (02/11/2024)    Housing Stability Vital Sign     Homeless in the Last Year: No      Objective:         Vitals:    06/16/24  1415  PainSc: 0-No pain    There is no height or weight on file to calculate BMI. Physical Exam Constitutional:  Appearance: Normal appearance.  HENT:     Head: Normocephalic and atraumatic.     Nose: Nose normal. No congestion.     Mouth/Throat:     Mouth: Mucous membranes are moist.     Pharynx: Oropharynx is clear.  Eyes:     Pupils: Pupils are equal, round, and reactive to light.  Cardiovascular:     Rate and Rhythm: Normal rate and regular rhythm.     Pulses: Normal pulses.     Heart sounds: Normal heart sounds. No murmur heard.    No friction rub. No gallop.  Pulmonary:     Effort: Pulmonary effort is normal. No respiratory distress.     Breath sounds: Normal breath sounds. No stridor. No wheezing, rhonchi or rales.  Abdominal:     General: Abdomen is flat.     Hernia: A hernia is present. Hernia is present in the right inguinal area. There is no hernia in the left inguinal area.  Musculoskeletal:        General: Normal range of motion.     Cervical back: Normal range of motion.  Skin:    General: Skin is warm and dry.  Neurological:     General: No focal deficit present.     Mental Status: He is alert and oriented to person, place, and time.  Psychiatric:        Mood and Affect: Mood normal.        Thought Content: Thought content normal.         Assessment and Plan:  Diagnoses and all orders for this visit:   Unilateral inguinal hernia without obstruction or gangrene, recurrence not specified     Thurl Boen is a 73 y.o. male     We will proceed to the OR for a LAP RIGHT inguinal hernia repair with mesh. All risks and benefits were discussed with the patient, to generally include infection, bleeding, damage to surrounding structures, acute and chronic nerve pain, and recurrence. Alternatives were offered and described.  All questions were answered and the patient voiced understanding of the procedure and wishes to proceed at this point.              No follow-ups on file.   Lynda Leos, MD, [facility], [place] General & Minimally Invasive Surgery

## 2024-07-28 ENCOUNTER — Ambulatory Visit (HOSPITAL_COMMUNITY): Payer: Self-pay | Admitting: Vascular Surgery

## 2024-07-28 ENCOUNTER — Ambulatory Visit (HOSPITAL_COMMUNITY)
Admission: RE | Admit: 2024-07-28 | Discharge: 2024-07-28 | Disposition: A | Discharge status: Home / Self Care | Attending: General Surgery | Admitting: General Surgery

## 2024-07-28 ENCOUNTER — Other Ambulatory Visit: Payer: Self-pay

## 2024-07-28 ENCOUNTER — Encounter (HOSPITAL_COMMUNITY): Payer: Self-pay | Admitting: General Surgery

## 2024-07-28 ENCOUNTER — Ambulatory Visit (HOSPITAL_BASED_OUTPATIENT_CLINIC_OR_DEPARTMENT_OTHER): Payer: Self-pay

## 2024-07-28 ENCOUNTER — Encounter (HOSPITAL_COMMUNITY)
Admission: RE | Disposition: A | Discharge status: Home / Self Care | Payer: Self-pay | Source: Home / Self Care | Attending: General Surgery

## 2024-07-28 DIAGNOSIS — C9000 Multiple myeloma not having achieved remission: Secondary | ICD-10-CM | POA: Diagnosis not present

## 2024-07-28 DIAGNOSIS — K4090 Unilateral inguinal hernia, without obstruction or gangrene, not specified as recurrent: Secondary | ICD-10-CM | POA: Diagnosis not present

## 2024-07-28 DIAGNOSIS — Z01818 Encounter for other preprocedural examination: Secondary | ICD-10-CM

## 2024-07-28 HISTORY — PX: INSERTION OF MESH: SHX5868

## 2024-07-28 HISTORY — PX: INGUINAL HERNIA REPAIR: SHX194

## 2024-07-28 SURGERY — REPAIR, HERNIA, INGUINAL, LAPAROSCOPIC
Anesthesia: General | Site: Inguinal | Laterality: Right

## 2024-07-28 MED ORDER — ROCURONIUM BROMIDE 10 MG/ML (PF) SYRINGE
PREFILLED_SYRINGE | INTRAVENOUS | Status: DC | PRN
  Administered 2024-07-28: 20 mg via INTRAVENOUS
  Administered 2024-07-28: 50 mg via INTRAVENOUS

## 2024-07-28 MED ORDER — FENTANYL CITRATE (PF) 100 MCG/2ML IJ SOLN
25.0000 ug | INTRAMUSCULAR | Status: DC | PRN
  Administered 2024-07-28 (×2): 50 ug via INTRAVENOUS

## 2024-07-28 MED ORDER — PROPOFOL 10 MG/ML IV BOLUS
INTRAVENOUS | Status: DC | PRN
  Administered 2024-07-28: 140 mg via INTRAVENOUS

## 2024-07-28 MED ORDER — LIDOCAINE 2% (20 MG/ML) 5 ML SYRINGE
INTRAMUSCULAR | Status: DC | PRN
  Administered 2024-07-28: 40 mg via INTRAVENOUS

## 2024-07-28 MED ORDER — FENTANYL CITRATE (PF) 100 MCG/2ML IJ SOLN
INTRAMUSCULAR | Status: AC
  Filled 2024-07-28: qty 2

## 2024-07-28 MED ORDER — ORAL CARE MOUTH RINSE: 15.0000 mL | Freq: Once | OROMUCOSAL | Status: AC

## 2024-07-28 MED ORDER — MIDAZOLAM HCL (PF) 2 MG/2ML IJ SOLN
INTRAMUSCULAR | Status: DC | PRN
  Administered 2024-07-28 (×2): 1 mg via INTRAVENOUS

## 2024-07-28 MED ORDER — ONDANSETRON HCL 4 MG/2ML IJ SOLN
INTRAMUSCULAR | Status: DC | PRN
  Administered 2024-07-28: 4 mg via INTRAVENOUS

## 2024-07-28 MED ORDER — CHLORHEXIDINE GLUCONATE CLOTH 2 % EX PADS: 6.0000 | MEDICATED_PAD | Freq: Once | CUTANEOUS | Status: DC

## 2024-07-28 MED ORDER — BUPIVACAINE-EPINEPHRINE (PF) 0.25% -1:200000 IJ SOLN
INTRAMUSCULAR | Status: DC | PRN
  Administered 2024-07-28: 30 mL

## 2024-07-28 MED ORDER — LACTATED RINGERS IV SOLN: INTRAVENOUS | Status: DC

## 2024-07-28 MED ORDER — ACETAMINOPHEN 500 MG PO TABS
1000.0000 mg | ORAL_TABLET | ORAL | Status: AC
  Administered 2024-07-28: 1000 mg via ORAL
  Filled 2024-07-28: qty 2

## 2024-07-28 MED ORDER — TRAMADOL HCL 50 MG PO TABS: 50.0000 mg | ORAL_TABLET | Freq: Four times a day (QID) | ORAL | 0 refills | Status: AC | PRN

## 2024-07-28 MED ORDER — EPHEDRINE SULFATE-NACL 50-0.9 MG/10ML-% IV SOSY
PREFILLED_SYRINGE | INTRAVENOUS | Status: DC | PRN
  Administered 2024-07-28 (×2): 5 mg via INTRAVENOUS

## 2024-07-28 MED ORDER — PROPOFOL 10 MG/ML IV BOLUS
INTRAVENOUS | Status: AC
Start: 2024-07-28 — End: 2024-07-28
  Filled 2024-07-28: qty 20

## 2024-07-28 MED ORDER — 0.9 % SODIUM CHLORIDE (POUR BTL) OPTIME
TOPICAL | Status: DC | PRN
  Administered 2024-07-28: 1000 mL

## 2024-07-28 MED ORDER — SUGAMMADEX SODIUM 200 MG/2ML IV SOLN
INTRAVENOUS | Status: DC | PRN
  Administered 2024-07-28 (×2): 100 mg via INTRAVENOUS

## 2024-07-28 MED ORDER — ROCURONIUM BROMIDE 10 MG/ML (PF) SYRINGE
PREFILLED_SYRINGE | INTRAVENOUS | Status: AC
Start: 2024-07-28 — End: 2024-07-28
  Filled 2024-07-28: qty 10

## 2024-07-28 MED ORDER — LIDOCAINE 2% (20 MG/ML) 5 ML SYRINGE
INTRAMUSCULAR | Status: AC
  Filled 2024-07-28: qty 5

## 2024-07-28 MED ORDER — OXYCODONE HCL 5 MG/5ML PO SOLN: 5.0000 mg | Freq: Once | ORAL | Status: DC | PRN

## 2024-07-28 MED ORDER — ONDANSETRON HCL 4 MG/2ML IJ SOLN
INTRAMUSCULAR | Status: AC
  Filled 2024-07-28: qty 2

## 2024-07-28 MED ORDER — MIDAZOLAM HCL 2 MG/2ML IJ SOLN
INTRAMUSCULAR | Status: AC
  Filled 2024-07-28: qty 2

## 2024-07-28 MED ORDER — BUPIVACAINE-EPINEPHRINE (PF) 0.25% -1:200000 IJ SOLN
INTRAMUSCULAR | Status: AC
  Filled 2024-07-28: qty 30

## 2024-07-28 MED ORDER — DROPERIDOL 2.5 MG/ML IJ SOLN: 0.6250 mg | Freq: Once | INTRAMUSCULAR | Status: DC | PRN

## 2024-07-28 MED ORDER — EPHEDRINE 5 MG/ML INJ
INTRAVENOUS | Status: AC
  Filled 2024-07-28: qty 5

## 2024-07-28 MED ORDER — ACETAMINOPHEN 10 MG/ML IV SOLN: 1000.0000 mg | Freq: Once | INTRAVENOUS | Status: DC | PRN

## 2024-07-28 MED ORDER — FENTANYL CITRATE (PF) 250 MCG/5ML IJ SOLN
INTRAMUSCULAR | Status: DC | PRN
  Administered 2024-07-28 (×3): 50 ug via INTRAVENOUS

## 2024-07-28 MED ORDER — CHLORHEXIDINE GLUCONATE 0.12 % MT SOLN
15.0000 mL | Freq: Once | OROMUCOSAL | Status: AC
  Administered 2024-07-28: 15 mL via OROMUCOSAL
  Filled 2024-07-28: qty 15

## 2024-07-28 MED ORDER — DEXAMETHASONE SOD PHOSPHATE PF 10 MG/ML IJ SOLN
INTRAMUSCULAR | Status: DC | PRN
  Administered 2024-07-28: 10 mg via INTRAVENOUS

## 2024-07-28 MED ORDER — OXYCODONE HCL 5 MG PO TABS: 5.0000 mg | ORAL_TABLET | Freq: Once | ORAL | Status: DC | PRN

## 2024-07-28 MED ORDER — CEFAZOLIN SODIUM-DEXTROSE 2-4 GM/100ML-% IV SOLN
2.0000 g | INTRAVENOUS | Status: AC
  Administered 2024-07-28: 2 g via INTRAVENOUS
  Filled 2024-07-28: qty 100

## 2024-07-28 MED ORDER — ENSURE PRE-SURGERY PO LIQD: 296.0000 mL | Freq: Once | ORAL | Status: DC

## 2024-07-28 SURGICAL SUPPLY — 32 items
BAG COUNTER SPONGE SURGICOUNT (BAG) ×1 IMPLANT
CANISTER SUCTION 3000ML PPV (SUCTIONS) IMPLANT
COVER SURGICAL LIGHT HANDLE (MISCELLANEOUS) ×1 IMPLANT
DERMABOND ADVANCED .7 DNX12 (GAUZE/BANDAGES/DRESSINGS) ×1 IMPLANT
DISSECTOR BLUNT TIP ENDO 5MM (MISCELLANEOUS) IMPLANT
ELECTRODE REM PT RTRN 9FT ADLT (ELECTROSURGICAL) ×1 IMPLANT
ENDOLOOP SUT PDS II 0 18 (SUTURE) IMPLANT
GLOVE BIO SURGEON STRL SZ7.5 (GLOVE) ×2 IMPLANT
GOWN STRL REUS W/ TWL LRG LVL3 (GOWN DISPOSABLE) ×2 IMPLANT
GOWN STRL REUS W/ TWL XL LVL3 (GOWN DISPOSABLE) ×1 IMPLANT
IRRIGATION SUCT STRKRFLW 2 WTP (MISCELLANEOUS) IMPLANT
KIT BASIN OR (CUSTOM PROCEDURE TRAY) ×1 IMPLANT
KIT TURNOVER KIT B (KITS) ×1 IMPLANT
MESH 3DMAX 5X7 RT XLRG (Mesh General) IMPLANT
NDL INSUFFLATION 14GA 120MM (NEEDLE) IMPLANT
NEEDLE INSUFFLATION 14GA 120MM (NEEDLE) IMPLANT
PAD ARMBOARD POSITIONER FOAM (MISCELLANEOUS) ×2 IMPLANT
RELOAD STAPLE 4.0 BLU F/HERNIA (INSTRUMENTS) IMPLANT
SCISSORS LAP 5X35 DISP (ENDOMECHANICALS) ×1 IMPLANT
SET TUBE SMOKE EVAC HIGH FLOW (TUBING) ×1 IMPLANT
SOLN 0.9% NACL POUR BTL 1000ML (IV SOLUTION) ×1 IMPLANT
SOLN STERILE WATER BTL 1000 ML (IV SOLUTION) ×1 IMPLANT
STAPLER HERNIA 12 8.5 360D (INSTRUMENTS) IMPLANT
SUT MNCRL AB 4-0 PS2 18 (SUTURE) ×1 IMPLANT
SUT VIC AB 1 CT1 27XBRD ANBCTR (SUTURE) IMPLANT
TOWEL GREEN STERILE (TOWEL DISPOSABLE) ×1 IMPLANT
TOWEL GREEN STERILE FF (TOWEL DISPOSABLE) ×1 IMPLANT
TRAY LAPAROSCOPIC MC (CUSTOM PROCEDURE TRAY) ×1 IMPLANT
TROCAR OPTICAL SHORT 5MM (TROCAR) ×1 IMPLANT
TROCAR OPTICAL SLV SHORT 5MM (TROCAR) ×1 IMPLANT
TROCAR Z THREAD OPTICAL 12X100 (TROCAR) ×1 IMPLANT
WARMER LAPAROSCOPE (MISCELLANEOUS) ×1 IMPLANT

## 2024-07-28 NOTE — Discharge Instructions (Signed)

## 2024-07-28 NOTE — Interval H&P Note (Signed)
 History and Physical Interval Note:  07/28/2024 12:35 PM  Colin Calderon  has presented today for surgery, with the diagnosis of RIGHT INGUINAL HERNIA.  The various methods of treatment have been discussed with the patient and family. After consideration of risks, benefits and other options for treatment, the patient has consented to  Procedure(s): REPAIR, HERNIA, INGUINAL, LAPAROSCOPIC (Right) INSERTION OF MESH (Right) as a surgical intervention.  The patient's history has been reviewed, patient examined, no change in status, stable for surgery.  I have reviewed the patient's chart and labs.  Questions were answered to the patient's satisfaction.     Aubriel Khanna

## 2024-07-28 NOTE — Op Note (Signed)
 07/28/2024  1:54 PM  PATIENT:  Colin Calderon  73 y.o. male  PRE-OPERATIVE DIAGNOSIS:  RIGHT INGUINAL HERNIA  POST-OPERATIVE DIAGNOSIS:  RIGHT INDIRECT INGUINAL HERNIA  PROCEDURE:  Procedure(s): REPAIR, HERNIA, INGUINAL, LAPAROSCOPIC (Right) INSERTION OF MESH (Right)  SURGEON:  Surgeons and Role:    DEWAINE Rubin Calamity, MD - Primary  ASSISTANTS: Waddell Collier, RNFA   ANESTHESIA:   local and general  EBL:  minimal   BLOOD ADMINISTERED:none  DRAINS: none   LOCAL MEDICATIONS USED:  BUPIVICAINE   SPECIMEN:  No Specimen  DISPOSITION OF SPECIMEN:  N/A  COUNTS:  YES  TOURNIQUET:  * No tourniquets in log *  DICTATION: .Dragon Dictation  Counts: reported as correct x 2  Findings:  The patient had a large right indirect hernia  Indications for procedure:  The patient is a 73 year old male with a right inguinal hernia for several months. Patient complained of symptomatology to his right inguinal area. The patient was taken back for elective inguinal hernia repair.  Details of the procedure: The patient was taken back to the operating room. The patient was placed in supine position with bilateral SCDs in place.  The patient was prepped and draped in the usual sterile fashion.  After appropriate anitbiotics were confirmed, a time-out was confirmed and all facts were verified.  0.25% Marcaine was used to infiltrate the umbilical area. A 11-blade was used to cut down the skin and blunt dissection was used to get the anterior fashion.  The anterior fascia was incised approximately 1 cm and the muscles were retracted laterally. Blunt dissection was then used to create a space in the preperitoneal area. At this time a 10 mm camera was then introduced into the space and advanced the pubic tubercle and a 12 mm trocar was placed over this and insufflation was started.  At this time and space was created from medial to laterally the preperitoneal space.  Cooper's ligament was initially  cleaned off.  The hernia sac was identified in the indirect space. Dissection of the hernia sac and cord structures was undertaken the vas deferens was identified and protected in all parts of the case.  There was a small tear into the hernia sac. The tear was closed using a 0 Endoloop x 1.  Once the hernia sac was taken down to approximately the umbilicus a Bard 3D Max mesh, size: Nickola, was  introduced into the preperitoneal space.  The mesh was brought over to cover the direct and indirect hernia spaces.  This was anchored into place and secured to Cooper's ligament with 4.1mm staples from a Coviden hernia stapler. It was anchored to the anterior abdominal wall with 4.8 mm staples. The hernia sac was seen lying posterior to the mesh. There was no staples placed laterally. The insufflation was evacuated and the peritoneum was seen posterior to the mesh. The trochars were removed. The anterior fascia was reapproximated using #1 Vicryl on a UR- 6.  Intra-abdominal air was evacuated and the Veress needle removed. The skin was reapproximated using 4-0 Monocryl subcuticular fashion and Dermabond. The patient was awakened from general anesthesia and taken to recovery in stable condition.    PLAN OF CARE: Discharge to home after PACU  PATIENT DISPOSITION:  PACU - hemodynamically stable.   Delay start of Pharmacological VTE agent (>24hrs) due to surgical blood loss or risk of bleeding: not applicable

## 2024-07-28 NOTE — Anesthesia Postprocedure Evaluation (Addendum)
 Anesthesia Post Note  Patient: [name]  Procedure(s) Performed: REPAIR, HERNIA, INGUINAL, LAPAROSCOPIC (Right: Inguinal) INSERTION OF MESH (Right: Inguinal)     Patient location during evaluation: PACU Anesthesia Type: General Level of consciousness: awake and alert Pain management: pain level controlled Vital Signs Assessment: post-procedure vital signs reviewed and stable Respiratory status: spontaneous breathing, nonlabored ventilation, respiratory function stable and patient connected to nasal cannula oxygen  Cardiovascular status: blood pressure returned to baseline and stable Postop Assessment: no apparent nausea or vomiting Anesthetic complications: no   There were no known notable events for this encounter.  Last Vitals:  Vitals:   07/28/24 1500 07/28/24 1515  BP: 124/74 121/69  Pulse: 64 61  Resp: 18 14  Temp:  36.4 C  SpO2: 99% 97%                   Franky JONETTA Bald

## 2024-07-28 NOTE — Anesthesia Procedure Notes (Signed)
 Procedure Name: Intubation Date/Time: 07/28/2024 1:15 PM  Performed by: Tilford Franky BIRCH, MDPre-anesthesia Checklist: Patient identified, Emergency Drugs available, Suction available and Patient being monitored Patient Re-evaluated:Patient Re-evaluated prior to induction Oxygen  Delivery Method: Circle system utilized Preoxygenation: Pre-oxygenation with 100% oxygen  Induction Type: IV induction Ventilation: Mask ventilation without difficulty and Oral airway inserted - appropriate to patient size Laryngoscope Size: Mac and 4 Grade View: Grade I Tube type: Oral Tube size: 7.5 mm Number of attempts: 1 Airway Equipment and Method: Stylet and Oral airway Placement Confirmation: ETT inserted through vocal cords under direct vision, positive ETCO2 and breath sounds checked- equal and bilateral Secured at: 22 cm Tube secured with: Tape Dental Injury: Teeth and Oropharynx as per pre-operative assessment

## 2024-07-28 NOTE — Interval H&P Note (Signed)
 History and Physical Interval Note:  07/28/2024 12:36 PM  Colin Calderon  has presented today for surgery, with the diagnosis of RIGHT INGUINAL HERNIA.  The various methods of treatment have been discussed with the patient and family. After consideration of risks, benefits and other options for treatment, the patient has consented to  Procedure(s): REPAIR, HERNIA, INGUINAL, LAPAROSCOPIC (Right) INSERTION OF MESH (Right) as a surgical intervention.  The patient's history has been reviewed, patient examined, no change in status, stable for surgery.  I have reviewed the patient's chart and labs.  Questions were answered to the patient's satisfaction.     Colin Calderon

## 2024-07-28 NOTE — Transfer of Care (Signed)
 Immediate Anesthesia Transfer of Care Note  Patient: Colin Calderon  Procedure(s) Performed: REPAIR, HERNIA, INGUINAL, LAPAROSCOPIC (Right: Inguinal) INSERTION OF MESH (Right: Inguinal)  Patient Location: PACU  Anesthesia Type:General  Level of Consciousness: drowsy  Airway & Oxygen  Therapy: Patient Spontanous Breathing and Patient connected to face mask oxygen   Post-op Assessment: Report given to RN and Post -op Vital signs reviewed and stable  Post vital signs: Reviewed and stable  Last Vitals:  Vitals Value Taken Time  BP 147/75 07/28/24 14:06  Temp    Pulse 57 07/28/24 14:10  Resp 7 07/28/24 14:10  SpO2 100 % 07/28/24 14:10  Vitals shown include unfiled device data.  Last Pain:  Vitals:   07/28/24 1224  TempSrc:   PainSc: 0-No pain         Complications: There were no known notable events for this encounter.

## 2024-07-29 ENCOUNTER — Encounter (HOSPITAL_COMMUNITY): Payer: Self-pay | Admitting: General Surgery

## 2024-07-29 ENCOUNTER — Encounter: Payer: Self-pay | Admitting: Hematology

## 2024-08-07 ENCOUNTER — Other Ambulatory Visit

## 2024-08-07 ENCOUNTER — Ambulatory Visit

## 2024-08-10 ENCOUNTER — Encounter: Payer: Self-pay | Admitting: Hematology

## 2024-08-11 ENCOUNTER — Other Ambulatory Visit: Payer: Self-pay

## 2024-08-13 ENCOUNTER — Other Ambulatory Visit: Payer: Self-pay

## 2024-08-21 ENCOUNTER — Inpatient Hospital Stay: Attending: Oncology | Admitting: Hematology

## 2024-08-21 ENCOUNTER — Inpatient Hospital Stay: Attending: Oncology

## 2024-08-21 ENCOUNTER — Inpatient Hospital Stay

## 2024-08-21 VITALS — BP 122/59 | HR 70 | Temp 97.7°F | Resp 17 | Ht 71.0 in | Wt 174.0 lb

## 2024-08-21 DIAGNOSIS — Z5111 Encounter for antineoplastic chemotherapy: Secondary | ICD-10-CM | POA: Diagnosis not present

## 2024-08-21 DIAGNOSIS — C9000 Multiple myeloma not having achieved remission: Secondary | ICD-10-CM | POA: Diagnosis present

## 2024-08-21 DIAGNOSIS — Z79899 Other long term (current) drug therapy: Secondary | ICD-10-CM | POA: Diagnosis not present

## 2024-08-21 LAB — CMP (CANCER CENTER ONLY)
ALT: 15 U/L (ref 0–44)
AST: 19 U/L (ref 15–41)
Albumin: 4.5 g/dL (ref 3.5–5.0)
Alkaline Phosphatase: 56 U/L (ref 38–126)
Anion gap: 8 (ref 5–15)
BUN: 23 mg/dL (ref 8–23)
CO2: 29 mmol/L (ref 22–32)
Calcium: 9.5 mg/dL (ref 8.9–10.3)
Chloride: 107 mmol/L (ref 98–111)
Creatinine: 1.07 mg/dL (ref 0.61–1.24)
GFR, Estimated: >60 mL/min (ref >60)
Glucose, Bld: 104 mg/dL — ABNORMAL HIGH (ref 70–99)
Potassium: 4.6 mmol/L (ref 3.5–5.1)
Sodium: 143 mmol/L (ref 135–145)
Total Bilirubin: 0.5 mg/dL (ref 0.0–1.2)
Total Protein: 6.2 g/dL — ABNORMAL LOW (ref 6.5–8.1)

## 2024-08-21 LAB — CBC WITH DIFFERENTIAL (CANCER CENTER ONLY)
Abs Immature Granulocytes: 0 K/uL (ref 0.00–0.07)
Basophils Absolute: 0 K/uL (ref 0.0–0.1)
Basophils Relative: 1 %
Eosinophils Absolute: 0.1 K/uL (ref 0.0–0.5)
Eosinophils Relative: 2 %
HCT: 36.6 % — ABNORMAL LOW (ref 39.0–52.0)
Hemoglobin: 12.4 g/dL — ABNORMAL LOW (ref 13.0–17.0)
Immature Granulocytes: 0 %
Lymphocytes Relative: 13 %
Lymphs Abs: 0.5 K/uL — ABNORMAL LOW (ref 0.7–4.0)
MCH: 31.7 pg (ref 26.0–34.0)
MCHC: 33.9 g/dL (ref 30.0–36.0)
MCV: 93.6 fL (ref 80.0–100.0)
Monocytes Absolute: 0.4 K/uL (ref 0.1–1.0)
Monocytes Relative: 10 %
Neutro Abs: 2.8 K/uL (ref 1.7–7.7)
Neutrophils Relative %: 74 %
Platelet Count: 224 K/uL (ref 150–400)
RBC: 3.91 MIL/uL — ABNORMAL LOW (ref 4.22–5.81)
RDW: 13.5 % (ref 11.5–15.5)
WBC Count: 3.7 K/uL — ABNORMAL LOW (ref 4.0–10.5)
nRBC: 0 % (ref 0.0–0.2)

## 2024-08-21 NOTE — Progress Notes (Signed)
 HEMATOLOGY ONCOLOGY PROGRESS NOTE  Date of service: 08/21/2024  Patient Care Team: [name], MD as PCP - General (Hematology) Huey, [name], MD as Referring Physician (Neurology)  CHIEF COMPLAINT/PURPOSE OF CONSULTATION: Follow-up for continued evaluation and management of multiple myeloma.  HISTORY OF PRESENTING ILLNESS: (07/25/2023) Colin Calderon is a wonderful 73 y.o. male who has been referred to us  by [name] Huey, MD for evaluation and management of abnormal protein.    He presented to the ED on 07/06/2023 for pneumonia of both lungs due to infectious organism.    Today, he is accompanied by his wife and friend. 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 His anemia is gradually worsening, previously hgb was11.2, 10 months ago and more recently 9.7 a week ago.    He reports that he was having some numbness in his hands and feet present for 2-4 weeks, which caused him to see a neurologist. As part of the neurologist's workup to see if his symptoms are related to paraproteinemia, there were findings of abnormal protein with M protein level of 0.2 g/dL. His wife reports that he has a nerve conduction study planned for December.    He denies any new fatigue, unexplained fever, chills, night sweats, or unexpected weight loss. He reports a 3-pound weight loss based on his scale at home.    He reports that he recently had pneumonia which was not unusual as he did have pneumonia previosuly. Patient notes that he did develop a fever causing him to present to the ED. Patient denies any SOB at this time, but does endorse sporadic coughing. He denies any issues with urinary infections or sinus infections.    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    He reports having a slightly enlarged prostate.    Patient reports having his thyroid  checked with [facility] recently, but can't find the results. He reports a fhx of thyroid  issues.    He  denies any new leg swelling, skin rashes, change in vision that is new or different, or headaches that are new or different. Patient denies any abdominal symptoms, such as abdominal pain or abdominal distention.     Patient denies having any chronic medical issues such as heart or lung issues.    He reports that he only takes B-12 replacement. Patient notes that he was previously B12 deficient in the past. He reports that the vitamin pills were not being absorbed.    Patient tends to be vegetarian for the most part, but does eats chicken occasionally.    He has no other medical issues.    He reports a surgical history of inguinal hernia surgery years ago most likely on the right side.    He reports an allergy to amoxicillin with rash reaction. He has not other medication allergies.    Patient does report a history of thyroid  disorders in the family.  He reports that his brother passed away from leukemia. Patient reports that his father did have coronary issues 4  Mother 20 years   He is a never smoker and denies any alcohol consumption or use of other recreational chemicals.    Previously, patient was generally very active through activities including hiking, walking, and swimming. However, over the last couple of months he has endorsed new fatigue alongside worsening anemia. His wife reports that he has sometimes felt too tired to drive, which is unusual. She also noticed that he lags behind when they go  for walks together over the last six months.    He complains of poor sleep mostly due to urinary frequency, which is not a new symptom. 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 Patient reports very sensitive arms and legs. Patient reports that his mother did have horrible neuropathy and is constantly in pain.    His wife reports that patient previously had a loud voice and has lost his voice recently, which patient attributes to pneumonia. He  denies any chest discomfort at this time.    He did have thyroid  function checked recently. Patient denies any obvious lumps/bumps.Patient denies any spine pain or abdominal pain.    SUMMARY OF ONCOLOGIC HISTORY: Oncology History  Multiple myeloma (HCC)  10/24/2023 Initial Diagnosis   Multiple myeloma (HCC)   11/07/2023 -  Chemotherapy   Patient is on Treatment Plan : MYELOMA NEWLY DIAGNOSED TRANSPLANT CANDIDATE DaraVRd (Daratumumab  SQ) q21d x 6 Cycles (Induction/Consolidation)       INTERVAL HISTORY: Colin Calderon is a 73 y.o. male who is here today for continued evaluation and management of multiple myeloma. His wife is present via FaceTime.   he was last seen by me on 07/23/2024; at the time he mentioned experiencing carpal tunnel flare up in his wrists. He also endorses hot flashes at night. Recently elevated PSA, this is being monitored by his urologist.   Today, he reports his hernia repair surgery went well (07/28/2024). He is still experiencing some mild pain around the groin but this is improving.   He reports receiving his influenza vaccination and his COVID-19 booster. He denies any illness.   His wife inquired about stem cell therapy and Bite therapy.   Denies any new infection issues, abdominal pain, or leg swelling.   REVIEW OF SYSTEMS:   10 Point review of systems of done and is negative except as noted above.  MEDICAL HISTORY Past Medical History:  Diagnosis Date   Anemia    History of hiatal hernia    Multiple myeloma (HCC)    Pneumonia     SURGICAL HISTORY Past Surgical History:  Procedure Laterality Date   COLONOSCOPY  09/19/2021   HERNIA REPAIR     INGUINAL HERNIA REPAIR Right 07/28/2024   Procedure: REPAIR, HERNIA, INGUINAL, LAPAROSCOPIC;  Surgeon: Rubin Calamity, MD;  Location: [facility] OR;  Service: General;  Laterality: Right;   INSERTION OF MESH Right 07/28/2024   Procedure: INSERTION OF MESH;  Surgeon: Rubin Calamity, MD;  Location: [facility] OR;   Service: General;  Laterality: Right;    SOCIAL HISTORY Social History[1]  Social History   Social History Narrative   Not on file    SOCIAL DRIVERS OF HEALTH SDOH Screenings   Food Insecurity: Low Risk (10/16/2023)   Received from [facility]  Housing: Unknown (02/11/2024)   Received from [facility]  Transportation Needs: No Transportation Needs (10/16/2023)   Received from [facility]  Utilities: Low Risk (10/16/2023)   Received from [facility]  Alcohol Screen: Low Risk (07/01/2023)  Depression (PHQ2-9): Low Risk (05/15/2024)  Financial Resource Strain: Low Risk (07/01/2023)  Physical Activity: Sufficiently Active (07/01/2023)  Social Connections: Socially Integrated (07/01/2023)  Stress: No Stress Concern Present (07/01/2023)  Tobacco Use: Low Risk  (08/19/2024)   Received from [facility]  Health Literacy: Adequate Health Literacy (07/01/2023)     FAMILY HISTORY No family history on file.   ALLERGIES: is allergic to amoxicillin.  MEDICATIONS  Current  Outpatient Medications  Medication Sig Dispense Refill   acyclovir  (ZOVIRAX ) 400 MG tablet Take 1 tablet (400 mg total) by mouth 2 (two) times daily. 60 tablet 5   aspirin  EC 81 MG tablet Take 1 tablet (81 mg total) by mouth daily. 100 tablet 3   CYANOCOBALAMIN  SL Take 1 tablet by mouth once a week.     lenalidomide  (REVLIMID ) 15 MG capsule Take 1 capsule (15 mg total) by mouth daily. Take 1 capsule (15 mg total) by mouth daily for 21 days. Take 7 days off and repeat cycle. (Patient not taking: No sig reported) 21 capsule 0   ondansetron  (ZOFRAN ) 8 MG tablet Take 1 tablet (8 mg total) by mouth every 8 (eight) hours as needed for nausea or vomiting. (Patient not taking: Reported on 07/16/2024) 30 tablet 1   predniSONE  (STERAPRED UNI-PAK 21 TAB) 10 MG (21) TBPK tablet Per instructions (6,5,4,3,2,1) (Patient not taking: Reported on 07/16/2024) 21 tablet 0   prochlorperazine  (COMPAZINE ) 10  MG tablet Take 1 tablet (10 mg total) by mouth every 6 (six) hours as needed for nausea or vomiting. (Patient not taking: Reported on 07/16/2024) 30 tablet 1   traMADol  (ULTRAM ) 50 MG tablet Take 1 tablet (50 mg total) by mouth every 6 (six) hours as needed. 20 tablet 0   No current facility-administered medications for this visit.    PHYSICAL EXAMINATION: ECOG PERFORMANCE STATUS: 1 - Symptomatic but completely ambulatory VITALS: Vitals:   08/21/24 1130  BP: (!) 122/59  Pulse: 70  Resp: 17  Temp: 97.7 F (36.5 C)  SpO2: 99%   Filed Weights   08/21/24 1130  Weight: 174 lb (78.9 kg)   Body mass index is 24.27 kg/m.  GENERAL: alert, in no acute distress and comfortable SKIN: no acute rashes, no significant lesions EYES: conjunctiva are pink and non-injected, sclera anicteric OROPHARYNX: MMM, no exudates, no oropharyngeal erythema or ulceration NECK: supple, no JVD LYMPH:  no palpable lymphadenopathy in the cervical, axillary or inguinal regions LUNGS: clear to auscultation b/l with normal respiratory effort HEART: regular rate & rhythm ABDOMEN:  normoactive bowel sounds , non tender, not distended, no hepatosplenomegaly Extremity: no pedal edema PSYCH: alert & oriented x 3 with fluent speech NEURO: no focal motor/sensory deficits  LABORATORY DATA:   I have reviewed the data as listed     Latest Ref Rng & Units 08/21/2024   11:15 AM 07/23/2024   11:00 AM 06/12/2024    8:24 AM  CBC EXTENDED  WBC 4.0 - 10.5 K/uL 3.7  5.7  2.4   RBC 4.22 - 5.81 MIL/uL 3.91  3.62  3.74   Hemoglobin 13.0 - 17.0 g/dL 87.5  88.2  87.8   HCT 39.0 - 52.0 % 36.6  34.8  35.3   Platelets 150 - 400 K/uL 224  306  190   NEUT# 1.7 - 7.7 K/uL 2.8  4.8  0.7   Lymph# 0.7 - 4.0 K/uL 0.5  0.5  1.1        Latest Ref Rng & Units 08/21/2024   11:15 AM 07/23/2024   11:00 AM 06/12/2024    8:24 AM  CMP  Glucose 70 - 99 mg/dL 895  898  852   BUN 8 - 23 mg/dL 23  22  18    Creatinine 0.61 - 1.24 mg/dL  8.92  9.04  8.89   Sodium 135 - 145 mmol/L 143  144  142   Potassium 3.5 - 5.1 mmol/L 4.6  4.5  4.4  Chloride 98 - 111 mmol/L 107  112  109   CO2 22 - 32 mmol/L 29  27  30    Calcium 8.9 - 10.3 mg/dL 9.5  8.9  9.1   Total Protein 6.5 - 8.1 g/dL 6.2  5.7  5.9   Total Bilirubin 0.0 - 1.2 mg/dL 0.5  0.5  0.5   Alkaline Phos 38 - 126 U/L 56  55  46   AST 15 - 41 U/L 19  13  20    ALT 0 - 44 U/L 15  10  21      PET/CT Whole Body 06/15/2024  Impression   CONCLUSION: Compared to FDG PET/CT 07/26/2023: 1.  Interval decrease in FDG avidity of previously seen hypermetabolic lymph nodes above and below the diaphragm. There is a new left periclavicular lymph node with mild FDG uptake. 2.  Resolution of the previously seen hypermetabolic osseous lesions and background bone marrow uptake. No new FDG avid or lytic osseous lesion. Narrative   PET/CT WHOLE BODY, 06/15/2024 4:01 PM  INDICATION: Pre ASCT eval;  Multiple Myeloma, Hematologic malignancy, assess treatment response, Pre stem cell transplant evaluation, Encounter for other preprocedural examination \ \ Z01.818 Encounter for other preprocedural examination \ C90.00 Multiple myeloma not having achieved remission    (CMD) \ Z52.011 Autologous donor, stem cells Pre ASCT eval;  Multiple Myeloma ADDITIONAL HISTORY: As above COMPARISON: FDG PET/CT 07/26/2023  TECHNIQUE: 57 minutes after the intravenous injection of 12.09 mCi F-18 FDG, PET imaging was obtained from the skull vertex through the feet. These images were then attenuation corrected using low-dose CT and co-registered for anatomic localization. Standardized uptake values (SUV) were calculated using a lean body mass algorithm. Blood glucose at the time of injection was 84 mg/dL.  All CT scans at [facility] and [facility] are performed using radiation dose optimization techniques as appropriate to a performed exam, including but not limited to one or more  of the following: automatic exposure control, adjustment of the mA and/or kV according to patient size, use of iterative reconstruction technique. In addition, our institution participates in a radiation dose monitoring program to optimize patient radiation exposure.  SUVmax of blood pool: 2 SUVmax of liver: 2.2  LIMITATIONS: None  FINDINGS:  DISEASE-RELATED FINDINGS: Less conspicuous appearance of the mediastinal and supraclavicular hypermetabolic lymphadenopathy, however there is a new left periclavicular lymph node seen on image 161 with SUV max 5. Right upper paratracheal lymph node on image 175 with SUV max 4.2, previously SUV max 7.6. Mildly FDG avid subcarinal lymph node on image 197 with SUV max 3.5, previously SUV max 13.  Minimal background FDG uptake with scattered soft tissue densities in bilateral iliac bones, which favors bone marrow hyperplasia. No focal FDG avid lesion is visualized. Interval resolution of previously seen focal FDG uptake in the right scapula, left eccentric manubrium and sternum. There is also resolution of previously seen prominent FDG uptake in the background bone marrow.  Interval decrease in conspicuity of previously seen peripancreatic lymphadenopathy. Normal size of the spleen without abnormal FDG uptake  OTHER TRACER-RELATED FINDINGS: Expected physiologic activity within the kidneys, ureter, bladder, oropharynx, salivary glands, bowel, and brain.   ANCILLARY CT FINDINGS: Evaluation of the lower thighs and legs reveals no additional contributory findings. Minimal LAD calcifications Right apical calcified pulmonary granuloma measuring 0.3 cm Again seen prostatomegaly and bladder wall thickening.    RADIOGRAPHIC STUDIES: I have personally reviewed the radiological images as listed and agreed with the  findings in the report. No results found.  ASSESSMENT & PLAN:  73 y.o. male with  #IgG Lambda  Multiple myeloma -Kappa light chain producing. -BMBx  from 07/17/2023 showed two plasmacytoid populations, one kappa and one lambda restricted. The predominant clone is CD20 positive and CD5 and CD10 negative.  - Patient started Dara/Rev/Dex on 11/07/2023.  - Revlimid  was dose reduced from 25 mg to 15 mg on 12/23/2023 due to cytopenias.     PLAN: - Discussed lab results on 08/21/2024 in detail with patient: -No CBC concerns -Kappa Lambda Light Chains are beginning to increase due to delayed treatments -Resume Daratumumab  treatment scheduled for 08/27/2024, will continue every two weeks -Resume taking Revlimid  and Aspirin  on 08/26/2024  -Goal is a deep response, or good partial response, to treatment, then two drug maintenance regiment -Recommends Vitamin D for bone health and immune system, and Vitamin B-complex -Counseled and informed patient of new cellular therapies and that he is a candidate for these therapies -RTC next week for treatment  FOLLOW-UP in 1-2 weeks for labs and follow-up with Dr. Onesimo.  The total time spent in the appointment was *** minutes* .  All of the patient's questions were answered and the patient knows to call the clinic with any problems, questions, or concerns.  Emaline Onesimo MD MS AAHIVMS [facility] Hematology/Oncology Physician [facility]  *Total Encounter Time as defined by the Centers for Medicare and Medicaid Services includes, in addition to the face-to-face time of a patient visit (documented in the note above) non-face-to-face time: obtaining and reviewing outside history, ordering and reviewing medications, tests or procedures, care coordination (communications with other health care professionals or caregivers) and documentation in the medical record.  I, Alan Blowers, acting as a neurosurgeon for Emaline Onesimo, MD.,have documented all relevant documentation on the behalf of Emaline Onesimo, MD,as directed by  Emaline Onesimo, MD while in the presence of Emaline Onesimo, MD.  I have reviewed the above  documentation for accuracy and completeness, and I agree with the above.  Emaline Onesimo, MD     [1]  Social History Tobacco Use   Smoking status: Never   Smokeless tobacco: Never  Vaping Use   Vaping status: Never Used  Substance Use Topics   Alcohol use: Not Currently   Drug use: No

## 2024-08-24 LAB — KAPPA/LAMBDA LIGHT CHAINS
Kappa free light chain: 159.3 mg/L — ABNORMAL HIGH (ref 3.3–19.4)
Kappa, lambda light chain ratio: 93.71 — ABNORMAL HIGH (ref 0.26–1.65)
Lambda free light chains: 1.7 mg/L — ABNORMAL LOW (ref 5.7–26.3)

## 2024-08-25 LAB — MULTIPLE MYELOMA PANEL, SERUM
Albumin SerPl Elph-Mcnc: 3.7 g/dL (ref 2.9–4.4)
Albumin/Glob SerPl: 1.9 — ABNORMAL HIGH (ref 0.7–1.7)
Alpha 1: 0.3 g/dL (ref 0.0–0.4)
Alpha2 Glob SerPl Elph-Mcnc: 0.6 g/dL (ref 0.4–1.0)
B-Globulin SerPl Elph-Mcnc: 0.9 g/dL (ref 0.7–1.3)
Gamma Glob SerPl Elph-Mcnc: 0.2 g/dL — ABNORMAL LOW (ref 0.4–1.8)
Globulin, Total: 2 g/dL — ABNORMAL LOW (ref 2.2–3.9)
IgA: 6 mg/dL — ABNORMAL LOW (ref 61–437)
IgG (Immunoglobin G), Serum: 262 mg/dL — ABNORMAL LOW (ref 603–1613)
IgM (Immunoglobulin M), Srm: 10 mg/dL — ABNORMAL LOW (ref 15–143)
M Protein SerPl Elph-Mcnc: 0.1 g/dL — ABNORMAL HIGH
Total Protein ELP: 5.7 g/dL — ABNORMAL LOW (ref 6.0–8.5)

## 2024-08-26 ENCOUNTER — Inpatient Hospital Stay

## 2024-08-26 ENCOUNTER — Other Ambulatory Visit (HOSPITAL_COMMUNITY): Payer: Self-pay

## 2024-08-26 ENCOUNTER — Telehealth: Payer: Self-pay

## 2024-08-26 VITALS — BP 115/81 | HR 69 | Temp 98.3°F | Resp 16

## 2024-08-26 DIAGNOSIS — C9000 Multiple myeloma not having achieved remission: Secondary | ICD-10-CM

## 2024-08-26 MED ORDER — ACETAMINOPHEN 325 MG PO TABS
650.0000 mg | ORAL_TABLET | Freq: Once | ORAL | Status: AC
  Administered 2024-08-26: 14:00:00 650 mg via ORAL
  Filled 2024-08-26: qty 2

## 2024-08-26 MED ORDER — DEXAMETHASONE 6 MG PO TABS
20.0000 mg | ORAL_TABLET | Freq: Once | ORAL | Status: AC
  Administered 2024-08-26: 14:00:00 20 mg via ORAL
  Filled 2024-08-26: qty 2

## 2024-08-26 MED ORDER — DIPHENHYDRAMINE HCL 25 MG PO CAPS
50.0000 mg | ORAL_CAPSULE | Freq: Once | ORAL | Status: AC
  Administered 2024-08-26: 14:00:00 50 mg via ORAL
  Filled 2024-08-26: qty 2

## 2024-08-26 MED ORDER — DARATUMUMAB-HYALURONIDASE-FIHJ 1800-30000 MG-UT/15ML ~~LOC~~ SOLN
1800.0000 mg | Freq: Once | SUBCUTANEOUS | Status: AC
  Administered 2024-08-26: 15:00:00 1800 mg via SUBCUTANEOUS
  Filled 2024-08-26: qty 15

## 2024-08-26 NOTE — Patient Instructions (Signed)

## 2024-08-26 NOTE — Telephone Encounter (Signed)
 Oral Oncology Patient Advocate Encounter  Was successful in securing patient a $8000 grant from [facility] to provide copayment coverage for Revlimid .  This will keep the out of pocket expense at $0.     [employer] ID: [id number]  RxBin: [id number] PCN: [id number] Member ID: [id number] Group ID: [id number] Dates of Eligibility: 09/24/24 through 09/23/25  Fund:  MM  Lucie Lamer, CPhT [place]  [facility] Oncology Pharmacy Patient Advocate Specialist [address] [place] Phone: [phone]  Fax: [phone] [email or web address]

## 2024-08-28 ENCOUNTER — Encounter: Payer: Self-pay | Admitting: Hematology

## 2024-09-04 ENCOUNTER — Inpatient Hospital Stay

## 2024-09-08 ENCOUNTER — Other Ambulatory Visit: Payer: Self-pay

## 2024-09-08 DIAGNOSIS — C9000 Multiple myeloma not having achieved remission: Secondary | ICD-10-CM

## 2024-09-10 NOTE — Progress Notes (Unsigned)
 "   HEMATOLOGY/ONCOLOGY CLINIC NOTE  Date of Service: 09/11/2024  Patient Care Team: Colin Emaline Brink, MD as PCP - General (Hematology) Huey, [name], MD as Referring Physician (Neurology)  CHIEF COMPLAINTS/PURPOSE OF CONSULTATION:  F/u for evaluation and management of multiple myeloma  INTERVAL HISTORY:  [name] returns for follow-up for follow-up for Cycle 9, Day 15 of Dara/Rev/Dex. He was last seen by Dr. Onesimo on 08/21/2024. In the interim, he continued on Dara/Rev/Dex therapy.  Mr. Hair reports he is slowly recovering after his hernia repair and yesterday went for a bike ride. He recently spent the holidays with his grandchildren and he developed a dry cough, postnasal drip and a sore throat. He denies any fevers or sputum with his cough.  His appetite is unchanged.  He denies nausea, vomiting or abdominal pain.  His bowel habits are unchanged without recurrent episodes of diarrhea or constipation. He denies fevers, chills, sweats, shortness of breath, headaches or dizziness. He has no other complaints.  MEDICAL HISTORY:  Past Medical History:  Diagnosis Date   Anemia    History of hiatal hernia    Multiple myeloma (HCC)    Pneumonia     SURGICAL HISTORY: Past Surgical History:  Procedure Laterality Date   COLONOSCOPY  09/19/2021   HERNIA REPAIR     INGUINAL HERNIA REPAIR Right 07/28/2024   Procedure: REPAIR, HERNIA, INGUINAL, LAPAROSCOPIC;  Surgeon: Rubin Calamity, MD;  Location: [facility] OR;  Service: General;  Laterality: Right;   INSERTION OF MESH Right 07/28/2024   Procedure: INSERTION OF MESH;  Surgeon: Rubin Calamity, MD;  Location: [facility] OR;  Service: General;  Laterality: Right;    SOCIAL HISTORY: Social History   Socioeconomic History   Marital status: Married    Spouse name: Not on file   Number of children: Not on file   Years of education: Not on file   Highest education level: Not on file  Occupational History   Not on file  Tobacco  Use   Smoking status: Never   Smokeless tobacco: Never  Vaping Use   Vaping status: Never Used  Substance and Sexual Activity   Alcohol use: Not Currently   Drug use: No   Sexual activity: Not on file  Other Topics Concern   Not on file  Social History Narrative   Not on file   Social Drivers of Health   Tobacco Use: Low Risk  (08/19/2024)   Received from Colin Calderon   Patient History    Smoking Tobacco Use: Never    Smokeless Tobacco Use: Never    Passive Exposure: Not on file  Financial Resource Strain: Low Risk (07/01/2023)   Overall Financial Resource Strain (CARDIA)    Difficulty of Paying Living Expenses: Not hard at all  Food Insecurity: Low Risk (10/16/2023)   Received from [facility]   Epic    Within the past 12 months, you worried that your food would run out before you got money to buy more: Never true    Within the past 12 months, the food you bought just didn't last and you didn't have money to get more. : Never true  Transportation Needs: No Transportation Needs (10/16/2023)   Received from [facility]    In the past 12 months, has lack of reliable transportation kept you from medical appointments, meetings, work or from getting things needed for daily living? : No  Physical Activity: Sufficiently Active (07/01/2023)   Exercise Vital Sign  Days of Exercise per Week: 7 days    Minutes of Exercise per Session: 40 min  Stress: No Stress Concern Present (07/01/2023)   [employer] of Occupational Health - Occupational Stress Questionnaire    Feeling of Stress : Not at all  Social Connections: Socially Integrated (07/01/2023)   Social Connection and Isolation Panel    Frequency of Communication with Friends and Family: Not on file    Frequency of Social Gatherings with Friends and Family: Three times a week    Attends Religious Services: More than 4 times per year    Active Member of Clubs or Organizations: Yes     Attends Banker Meetings: More than 4 times per year    Marital Status: Married  Catering Manager Violence: Not At Risk (07/01/2023)   Humiliation, Afraid, Rape, and Kick questionnaire    Fear of Current or Ex-Partner: No    Emotionally Abused: No    Physically Abused: No    Sexually Abused: No  Depression (PHQ2-9): Low Risk (09/11/2024)   Depression (PHQ2-9)    PHQ-2 Score: 0  Alcohol Screen: Low Risk (07/01/2023)   Alcohol Screen    Last Alcohol Screening Score (AUDIT): 0  Housing: Unknown (02/11/2024)   Received from [facility]   Epic    Unable to Pay for Housing in the Last Year: Not on file    Number of Times Moved in the Last Year: Not on file    At any time in the past 12 months, were you homeless or living in a shelter (including now)?: No  Utilities: Low Risk (10/16/2023)   Received from [facility]   Utilities    In the past 12 months has the electric, gas, oil, or water company threatened to shut off services in your home? : No  Health Literacy: Adequate Health Literacy (07/01/2023)   B1300 Health Literacy    Frequency of need for help with medical instructions: Never    FAMILY HISTORY: No family history on file.  ALLERGIES:  is allergic to amoxicillin.  MEDICATIONS:  Current Outpatient Medications  Medication Sig Dispense Refill   acyclovir  (ZOVIRAX ) 400 MG tablet Take 1 tablet (400 mg total) by mouth 2 (two) times daily. 60 tablet 5   aspirin  EC 81 MG tablet Take 1 tablet (81 mg total) by mouth daily. 100 tablet 3   CYANOCOBALAMIN  SL Take 1 tablet by mouth once a week.     lenalidomide  (REVLIMID ) 15 MG capsule Take 1 capsule (15 mg total) by mouth daily. Take 1 capsule (15 mg total) by mouth daily for 21 days. Take 7 days off and repeat cycle. 21 capsule 0   ondansetron  (ZOFRAN ) 8 MG tablet Take 1 tablet (8 mg total) by mouth every 8 (eight) hours as needed for nausea or vomiting. (Patient not taking: Reported on 07/16/2024) 30 tablet 1    predniSONE  (STERAPRED UNI-PAK 21 TAB) 10 MG (21) TBPK tablet Per instructions (6,5,4,3,2,1) (Patient not taking: Reported on 07/16/2024) 21 tablet 0   prochlorperazine  (COMPAZINE ) 10 MG tablet Take 1 tablet (10 mg total) by mouth every 6 (six) hours as needed for nausea or vomiting. (Patient not taking: Reported on 07/16/2024) 30 tablet 1   traMADol  (ULTRAM ) 50 MG tablet Take 1 tablet (50 mg total) by mouth every 6 (six) hours as needed. 20 tablet 0   No current facility-administered medications for this visit.   Facility-Administered Medications Ordered in Other Visits  Medication Dose Route Frequency Provider Last Rate  Last Admin   daratumumab -hyaluronidase -fihj (DARZALEX  FASPRO) 1800-30000 MG-UT/15ML chemo SQ injection 1,800 mg  1,800 mg Subcutaneous Once Colin Emaline Brink, MD        REVIEW OF SYSTEMS:    10 Point review of Systems was done is negative except as noted above.   PHYSICAL EXAMINATION: ECOG PERFORMANCE STATUS: 1 - Symptomatic but completely ambulatory   There were no vitals filed for this visit.    There were no vitals filed for this visit.   .There is no height or weight on file to calculate BMI.   Day 15, Cycle 9 09/11/24  Weight 172 lb (78 kg)  Temp 98.6 F (37 C)  Temp src Oral  Pulse 61  Resp 16  BP 111/71   GENERAL:alert, in no acute distress and comfortable SKIN: no acute rashes, no significant lesions EYES: conjunctiva are pink and non-injected, sclera anicteric LUNGS: clear to auscultation b/l with normal respiratory effort HEART: regular rate & rhythm Extremity: no pedal edema PSYCH: alert & oriented x 3 with fluent speech NEURO: no focal motor/sensory deficits   LABORATORY DATA:  I have reviewed the data as listed .    Latest Ref Rng & Units 09/11/2024    7:59 AM 08/21/2024   11:15 AM 07/23/2024   11:00 AM  CBC  WBC 4.0 - 10.5 K/uL 3.3  3.7  5.7   Hemoglobin 13.0 - 17.0 g/dL 88.2  87.5  88.2   Hematocrit 39.0 - 52.0 % 34.9  36.6   34.8   Platelets 150 - 400 K/uL 181  224  306     .    Latest Ref Rng & Units 09/11/2024    7:59 AM 08/21/2024   11:15 AM 07/23/2024   11:00 AM  CMP  Glucose 70 - 99 mg/dL 852  895  898   BUN 8 - 23 mg/dL 20  23  22    Creatinine 0.61 - 1.24 mg/dL 8.78  8.92  9.04   Sodium 135 - 145 mmol/L 141  143  144   Potassium 3.5 - 5.1 mmol/L 3.9  4.6  4.5   Chloride 98 - 111 mmol/L 105  107  112   CO2 22 - 32 mmol/L 27  29  27    Calcium 8.9 - 10.3 mg/dL 8.7  9.5  8.9   Total Protein 6.5 - 8.1 g/dL 6.1  6.2  5.7   Total Bilirubin 0.0 - 1.2 mg/dL 0.7  0.5  0.5   Alkaline Phos 38 - 126 U/L 49  56  55   AST 15 - 41 U/L 17  19  13    ALT 0 - 44 U/L 17  15  10       07/17/2023 Surgical pathology:   07/17/2023 Surgical pathology:      RADIOGRAPHIC STUDIES: I have personally reviewed the radiological images as listed and agreed with the findings in the report. No results found.   ASSESSMENT & PLAN:  Colin Calderon is a 74 y.o. male who returns for a follow up for multiple meyloma.   #IgG Lambda  Multiple myeloma -Kappa light chain producing. -BMBx from 07/17/2023 showed two plasmacytoid populations, one kappa and one lambda restricted. The predominant clone is CD20 positive and CD5 and CD10 negative.  -Patient started Dara/Rev/Dex on 11/07/2023.  -Revlimid  was dose reduced from 25 mg to 15 mg on 12/23/2023 due to cytopenias.  PLAN: -Due for Cycle 9, Day 15 of Dara/Rev/Dex. -Labs from today were reviewed and adequate for treatment. WBC  3.3, Hgb 11.7, Plt 181, creatinine and LFTs are normal.  -Most recent myeloma panel from 08/21/2024 showed M protein at 0.1 g/dL, kappa light chain has increased to 159.3. (likely secondary to held treatment with hernia surgery and cell harvesting) -Proceed with treatment without any dose modifications.  - Continue aspirin  81 mg p.o. 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 Likely viral in nature.  --Advised supportive care with cool mist humidifier, hydration, nasal spray. Will send tessalon  100 mg TID as needed for cough.  --Advised to monitor closely if he develops fever or productive cough.    #Elevated PSA level: --Patient reports most recent PSA with PCP was 5.5 --Prior PET scan shows prostamegaly.  --PCP is making referral to urology  #Hernia repair: --Underwent right inguinal hernia repair on 07/28/2024.   FOLLOW-UP: Per Integrated scheduling  All of the patient's questions were answered with apparent satisfaction. The patient knows to call the clinic with any problems, questions or concerns.  I have spent a total of 30 minutes minutes of face-to-face and non-face-to-face time, preparing to see the patient,  performing a medically appropriate examination, counseling and educating the patient, documenting clinical information in the electronic health record, independently interpreting results and communicating results to the patient, and care coordination.   Johnston Police PA-C Dept of Hematology and Oncology [facility] at [facility] Phone: [phone]      "

## 2024-09-11 ENCOUNTER — Inpatient Hospital Stay

## 2024-09-11 ENCOUNTER — Inpatient Hospital Stay (HOSPITAL_BASED_OUTPATIENT_CLINIC_OR_DEPARTMENT_OTHER): Admitting: Physician Assistant

## 2024-09-11 ENCOUNTER — Inpatient Hospital Stay: Attending: Oncology

## 2024-09-11 VITALS — BP 111/71 | HR 61 | Temp 98.6°F | Resp 16 | Wt 172.0 lb

## 2024-09-11 DIAGNOSIS — Z5111 Encounter for antineoplastic chemotherapy: Secondary | ICD-10-CM | POA: Diagnosis not present

## 2024-09-11 DIAGNOSIS — R058 Other specified cough: Secondary | ICD-10-CM | POA: Diagnosis not present

## 2024-09-11 DIAGNOSIS — C9000 Multiple myeloma not having achieved remission: Secondary | ICD-10-CM | POA: Insufficient documentation

## 2024-09-11 DIAGNOSIS — N400 Enlarged prostate without lower urinary tract symptoms: Secondary | ICD-10-CM | POA: Diagnosis not present

## 2024-09-11 DIAGNOSIS — Z7982 Long term (current) use of aspirin: Secondary | ICD-10-CM | POA: Insufficient documentation

## 2024-09-11 LAB — CBC WITH DIFFERENTIAL (CANCER CENTER ONLY)
Abs Immature Granulocytes: 0.02 K/uL (ref 0.00–0.07)
Basophils Absolute: 0 K/uL (ref 0.0–0.1)
Basophils Relative: 1 %
Eosinophils Absolute: 0.2 K/uL (ref 0.0–0.5)
Eosinophils Relative: 6 %
HCT: 34.9 % — ABNORMAL LOW (ref 39.0–52.0)
Hemoglobin: 11.7 g/dL — ABNORMAL LOW (ref 13.0–17.0)
Immature Granulocytes: 1 %
Lymphocytes Relative: 11 %
Lymphs Abs: 0.4 K/uL — ABNORMAL LOW (ref 0.7–4.0)
MCH: 31.1 pg (ref 26.0–34.0)
MCHC: 33.5 g/dL (ref 30.0–36.0)
MCV: 92.8 fL (ref 80.0–100.0)
Monocytes Absolute: 0.4 K/uL (ref 0.1–1.0)
Monocytes Relative: 12 %
Neutro Abs: 2.3 K/uL (ref 1.7–7.7)
Neutrophils Relative %: 69 %
Platelet Count: 181 K/uL (ref 150–400)
RBC: 3.76 MIL/uL — ABNORMAL LOW (ref 4.22–5.81)
RDW: 13.5 % (ref 11.5–15.5)
WBC Count: 3.3 K/uL — ABNORMAL LOW (ref 4.0–10.5)
nRBC: 0 % (ref 0.0–0.2)

## 2024-09-11 LAB — CMP (CANCER CENTER ONLY)
ALT: 17 U/L (ref 0–44)
AST: 17 U/L (ref 15–41)
Albumin: 4.4 g/dL (ref 3.5–5.0)
Alkaline Phosphatase: 49 U/L (ref 38–126)
Anion gap: 9 (ref 5–15)
BUN: 20 mg/dL (ref 8–23)
CO2: 27 mmol/L (ref 22–32)
Calcium: 8.7 mg/dL — ABNORMAL LOW (ref 8.9–10.3)
Chloride: 105 mmol/L (ref 98–111)
Creatinine: 1.21 mg/dL (ref 0.61–1.24)
GFR, Estimated: >60 mL/min
Glucose, Bld: 147 mg/dL — ABNORMAL HIGH (ref 70–99)
Potassium: 3.9 mmol/L (ref 3.5–5.1)
Sodium: 141 mmol/L (ref 135–145)
Total Bilirubin: 0.7 mg/dL (ref 0.0–1.2)
Total Protein: 6.1 g/dL — ABNORMAL LOW (ref 6.5–8.1)

## 2024-09-11 MED ORDER — DEXAMETHASONE 6 MG PO TABS
20.0000 mg | ORAL_TABLET | Freq: Once | ORAL | Status: AC
  Administered 2024-09-11: 20 mg via ORAL
  Filled 2024-09-11: qty 2

## 2024-09-11 MED ORDER — DIPHENHYDRAMINE HCL 25 MG PO CAPS
50.0000 mg | ORAL_CAPSULE | Freq: Once | ORAL | Status: AC
  Administered 2024-09-11: 50 mg via ORAL
  Filled 2024-09-11: qty 2

## 2024-09-11 MED ORDER — DARATUMUMAB-HYALURONIDASE-FIHJ 1800-30000 MG-UT/15ML ~~LOC~~ SOLN
1800.0000 mg | Freq: Once | SUBCUTANEOUS | Status: AC
  Administered 2024-09-11: 1800 mg via SUBCUTANEOUS
  Filled 2024-09-11: qty 15

## 2024-09-11 MED ORDER — ACETAMINOPHEN 325 MG PO TABS
650.0000 mg | ORAL_TABLET | Freq: Once | ORAL | Status: AC
  Administered 2024-09-11: 650 mg via ORAL
  Filled 2024-09-11: qty 2

## 2024-09-11 MED ORDER — BENZONATATE 100 MG PO CAPS: 100.0000 mg | ORAL_CAPSULE | Freq: Three times a day (TID) | ORAL | 0 refills | Status: AC | PRN

## 2024-09-11 NOTE — Patient Instructions (Signed)

## 2024-09-13 ENCOUNTER — Encounter: Payer: Self-pay | Admitting: Hematology

## 2024-09-13 ENCOUNTER — Encounter: Payer: Self-pay | Admitting: Physician Assistant

## 2024-09-14 ENCOUNTER — Encounter: Payer: Self-pay | Admitting: Hematology

## 2024-09-15 ENCOUNTER — Other Ambulatory Visit: Payer: Self-pay

## 2024-09-16 ENCOUNTER — Other Ambulatory Visit: Payer: Self-pay

## 2024-09-16 DIAGNOSIS — C9000 Multiple myeloma not having achieved remission: Secondary | ICD-10-CM

## 2024-09-16 MED ORDER — LENALIDOMIDE 15 MG PO CAPS: 15.0000 mg | ORAL_CAPSULE | Freq: Every day | ORAL | 0 refills | Status: DC

## 2024-09-18 ENCOUNTER — Inpatient Hospital Stay

## 2024-09-18 ENCOUNTER — Inpatient Hospital Stay: Admitting: Hematology

## 2024-09-19 ENCOUNTER — Other Ambulatory Visit: Payer: Self-pay

## 2024-09-23 ENCOUNTER — Inpatient Hospital Stay

## 2024-09-25 ENCOUNTER — Inpatient Hospital Stay

## 2024-09-29 NOTE — Progress Notes (Signed)
 Patient Colin Calderon with Johnston Police, PA, 08/21/24. Patient will continue with cycle 9 chemotherapy. This navigator will sign off, but is available is needed.

## 2024-10-02 ENCOUNTER — Inpatient Hospital Stay

## 2024-10-02 VITALS — BP 98/69 | HR 60 | Temp 97.7°F | Resp 16 | Ht 71.0 in | Wt 171.5 lb

## 2024-10-02 DIAGNOSIS — C9000 Multiple myeloma not having achieved remission: Secondary | ICD-10-CM

## 2024-10-02 DIAGNOSIS — Z5111 Encounter for antineoplastic chemotherapy: Secondary | ICD-10-CM | POA: Diagnosis not present

## 2024-10-02 LAB — CMP (CANCER CENTER ONLY)
ALT: 15 U/L (ref 0–44)
AST: 15 U/L (ref 15–41)
Albumin: 4.1 g/dL (ref 3.5–5.0)
Alkaline Phosphatase: 46 U/L (ref 38–126)
Anion gap: 11 (ref 5–15)
BUN: 15 mg/dL (ref 8–23)
CO2: 24 mmol/L (ref 22–32)
Calcium: 8.1 mg/dL — ABNORMAL LOW (ref 8.9–10.3)
Chloride: 107 mmol/L (ref 98–111)
Creatinine: 1.01 mg/dL (ref 0.61–1.24)
GFR, Estimated: >60 mL/min
Glucose, Bld: 102 mg/dL — ABNORMAL HIGH (ref 70–99)
Potassium: 3.8 mmol/L (ref 3.5–5.1)
Sodium: 141 mmol/L (ref 135–145)
Total Bilirubin: 0.6 mg/dL (ref 0.0–1.2)
Total Protein: 5.8 g/dL — ABNORMAL LOW (ref 6.5–8.1)

## 2024-10-02 LAB — CBC WITH DIFFERENTIAL (CANCER CENTER ONLY)
Abs Immature Granulocytes: 0 K/uL (ref 0.00–0.07)
Basophils Absolute: 0 K/uL (ref 0.0–0.1)
Basophils Relative: 1 %
Eosinophils Absolute: 0.2 K/uL (ref 0.0–0.5)
Eosinophils Relative: 13 %
HCT: 36.7 % — ABNORMAL LOW (ref 39.0–52.0)
Hemoglobin: 12.5 g/dL — ABNORMAL LOW (ref 13.0–17.0)
Immature Granulocytes: 0 %
Lymphocytes Relative: 15 %
Lymphs Abs: 0.2 K/uL — ABNORMAL LOW (ref 0.7–4.0)
MCH: 31.1 pg (ref 26.0–34.0)
MCHC: 34.1 g/dL (ref 30.0–36.0)
MCV: 91.3 fL (ref 80.0–100.0)
Monocytes Absolute: 0.1 K/uL (ref 0.1–1.0)
Monocytes Relative: 6 %
Neutro Abs: 1 K/uL — ABNORMAL LOW (ref 1.7–7.7)
Neutrophils Relative %: 65 %
Platelet Count: 167 K/uL (ref 150–400)
RBC: 4.02 MIL/uL — ABNORMAL LOW (ref 4.22–5.81)
RDW: 13.3 % (ref 11.5–15.5)
WBC Count: 1.5 K/uL — ABNORMAL LOW (ref 4.0–10.5)
nRBC: 0 % (ref 0.0–0.2)

## 2024-10-02 MED ORDER — ACETAMINOPHEN 325 MG PO TABS
650.0000 mg | ORAL_TABLET | Freq: Once | ORAL | Status: AC
  Administered 2024-10-02: 650 mg via ORAL
  Filled 2024-10-02: qty 2

## 2024-10-02 MED ORDER — DEXAMETHASONE 6 MG PO TABS
20.0000 mg | ORAL_TABLET | Freq: Once | ORAL | Status: AC
  Administered 2024-10-02: 20 mg via ORAL
  Filled 2024-10-02: qty 2

## 2024-10-02 MED ORDER — DIPHENHYDRAMINE HCL 25 MG PO CAPS
50.0000 mg | ORAL_CAPSULE | Freq: Once | ORAL | Status: AC
  Administered 2024-10-02: 50 mg via ORAL
  Filled 2024-10-02: qty 2

## 2024-10-02 MED ORDER — DARATUMUMAB-HYALURONIDASE-FIHJ 1800-30000 MG-UT/15ML ~~LOC~~ SOLN
1800.0000 mg | Freq: Once | SUBCUTANEOUS | Status: AC
  Administered 2024-10-02: 1800 mg via SUBCUTANEOUS
  Filled 2024-10-02: qty 15

## 2024-10-02 NOTE — Patient Instructions (Signed)
 [facility] - A DEPT OF [facility]  Discharge Instructions: Thank you for choosing [facility] to provide your oncology and hematology care.   If you have a lab appointment with the Cancer Center, please go directly to the Cancer Center and check in at the registration area.   Wear comfortable clothing and clothing appropriate for easy access to any Portacath or PICC line.   We strive to give you quality time with your provider. You may need to reschedule your appointment if you arrive late (15 or more minutes).  Arriving late affects you and other patients whose appointments are after yours.  Also, if you miss three or more appointments without notifying the office, you may be dismissed from the clinic at the providers discretion.      For prescription refill requests, have your pharmacy contact our office and allow 72 hours for refills to be completed.    Today you received the following chemotherapy and/or immunotherapy agents darzalex  faspro   (Daratumumab -hyaluronidase )   To help prevent nausea and vomiting after your treatment, we encourage you to take your nausea medication as directed.  BELOW ARE SYMPTOMS THAT SHOULD BE REPORTED IMMEDIATELY: *FEVER GREATER THAN 100.4 F (38 C) OR HIGHER *CHILLS OR SWEATING *NAUSEA AND VOMITING THAT IS NOT CONTROLLED WITH YOUR NAUSEA MEDICATION *UNUSUAL SHORTNESS OF BREATH *UNUSUAL BRUISING OR BLEEDING *URINARY PROBLEMS (pain or burning when urinating, or frequent urination) *BOWEL PROBLEMS (unusual diarrhea, constipation, pain near the anus) TENDERNESS IN MOUTH AND THROAT WITH OR WITHOUT PRESENCE OF ULCERS (sore throat, sores in mouth, or a toothache) UNUSUAL RASH, SWELLING OR PAIN  UNUSUAL VAGINAL DISCHARGE OR ITCHING   Items with * indicate a potential emergency and should be followed up as soon as possible or go to the Emergency Department if any problems should occur.  Please show the  CHEMOTHERAPY ALERT CARD or IMMUNOTHERAPY ALERT CARD at check-in to the Emergency Department and triage nurse.  Should you have questions after your visit or need to cancel or reschedule your appointment, please contact [facility] - A DEPT OF [facility]  Dept: [phone]  and follow the prompts.  Office hours are 8:00 a.m. to 4:30 p.m. Monday - Friday. Please note that voicemails left after 4:00 p.m. may not be returned until the following business day.  We are closed weekends and major holidays. You have access to a nurse at all times for urgent questions. Please call the main number to the clinic Dept: [phone] and follow the prompts.   For any non-urgent questions, you may also contact your provider using MyChart. We now offer e-Visits for anyone 67 and older to request care online for non-urgent symptoms. For details visit [email or web address].   Also download the MyChart app! Go to the app store, search MyChart, open the app, select [place], and log in with your MyChart username and password.

## 2024-10-06 ENCOUNTER — Ambulatory Visit: Payer: Self-pay | Admitting: Physician Assistant

## 2024-10-06 LAB — KAPPA/LAMBDA LIGHT CHAINS
Kappa free light chain: 27.1 mg/L — ABNORMAL HIGH (ref 3.3–19.4)
Kappa, lambda light chain ratio: 10.84 — ABNORMAL HIGH (ref 0.26–1.65)
Lambda free light chains: 2.5 mg/L — ABNORMAL LOW (ref 5.7–26.3)

## 2024-10-07 LAB — MULTIPLE MYELOMA PANEL, SERUM
Albumin SerPl Elph-Mcnc: 3.5 g/dL (ref 2.9–4.4)
Albumin/Glob SerPl: 2 — ABNORMAL HIGH (ref 0.7–1.7)
Alpha 1: 0.3 g/dL (ref 0.0–0.4)
Alpha2 Glob SerPl Elph-Mcnc: 0.6 g/dL (ref 0.4–1.0)
B-Globulin SerPl Elph-Mcnc: 0.7 g/dL (ref 0.7–1.3)
Gamma Glob SerPl Elph-Mcnc: 0.2 g/dL — ABNORMAL LOW (ref 0.4–1.8)
Globulin, Total: 1.8 g/dL — ABNORMAL LOW (ref 2.2–3.9)
IgA: 8 mg/dL — ABNORMAL LOW (ref 61–437)
IgG (Immunoglobin G), Serum: 251 mg/dL — ABNORMAL LOW (ref 603–1613)
IgM (Immunoglobulin M), Srm: <5 mg/dL — ABNORMAL LOW (ref 15–143)
M Protein SerPl Elph-Mcnc: 0.1 g/dL — ABNORMAL HIGH
Total Protein ELP: 5.3 g/dL — ABNORMAL LOW (ref 6.0–8.5)

## 2024-10-09 ENCOUNTER — Inpatient Hospital Stay: Admitting: Hematology

## 2024-10-09 ENCOUNTER — Encounter: Payer: Self-pay | Admitting: Hematology

## 2024-10-09 ENCOUNTER — Other Ambulatory Visit: Payer: Self-pay

## 2024-10-09 ENCOUNTER — Inpatient Hospital Stay

## 2024-10-09 DIAGNOSIS — C9000 Multiple myeloma not having achieved remission: Secondary | ICD-10-CM

## 2024-10-09 MED ORDER — LENALIDOMIDE 15 MG PO CAPS: 15.0000 mg | ORAL_CAPSULE | Freq: Every day | ORAL | 0 refills | Status: AC

## 2024-10-16 ENCOUNTER — Inpatient Hospital Stay

## 2024-10-16 ENCOUNTER — Inpatient Hospital Stay: Attending: Oncology

## 2024-10-16 ENCOUNTER — Inpatient Hospital Stay: Admitting: Hematology

## 2024-10-16 DIAGNOSIS — C9000 Multiple myeloma not having achieved remission: Secondary | ICD-10-CM

## 2024-10-16 LAB — CMP (CANCER CENTER ONLY)
ALT: 19 U/L (ref 0–44)
AST: 18 U/L (ref 15–41)
Albumin: 4 g/dL (ref 3.5–5.0)
Alkaline Phosphatase: 49 U/L (ref 38–126)
Anion gap: 8 (ref 5–15)
BUN: 13 mg/dL (ref 8–23)
CO2: 28 mmol/L (ref 22–32)
Calcium: 8.5 mg/dL — ABNORMAL LOW (ref 8.9–10.3)
Chloride: 108 mmol/L (ref 98–111)
Creatinine: 1 mg/dL (ref 0.61–1.24)
GFR, Estimated: >60 mL/min
Glucose, Bld: 112 mg/dL — ABNORMAL HIGH (ref 70–99)
Potassium: 4.7 mmol/L (ref 3.5–5.1)
Sodium: 144 mmol/L (ref 135–145)
Total Bilirubin: 0.3 mg/dL (ref 0.0–1.2)
Total Protein: 5.6 g/dL — ABNORMAL LOW (ref 6.5–8.1)

## 2024-10-16 LAB — CBC WITH DIFFERENTIAL (CANCER CENTER ONLY)
Abs Immature Granulocytes: 0.01 10*3/uL (ref 0.00–0.07)
Basophils Absolute: 0 10*3/uL (ref 0.0–0.1)
Basophils Relative: 1 %
Eosinophils Absolute: 0.3 10*3/uL (ref 0.0–0.5)
Eosinophils Relative: 12 %
HCT: 35.4 % — ABNORMAL LOW (ref 39.0–52.0)
Hemoglobin: 11.7 g/dL — ABNORMAL LOW (ref 13.0–17.0)
Immature Granulocytes: 0 %
Lymphocytes Relative: 17 %
Lymphs Abs: 0.4 10*3/uL — ABNORMAL LOW (ref 0.7–4.0)
MCH: 30.3 pg (ref 26.0–34.0)
MCHC: 33.1 g/dL (ref 30.0–36.0)
MCV: 91.7 fL (ref 80.0–100.0)
Monocytes Absolute: 0.3 10*3/uL (ref 0.1–1.0)
Monocytes Relative: 13 %
Neutro Abs: 1.3 10*3/uL — ABNORMAL LOW (ref 1.7–7.7)
Neutrophils Relative %: 57 %
Platelet Count: 189 10*3/uL (ref 150–400)
RBC: 3.86 MIL/uL — ABNORMAL LOW (ref 4.22–5.81)
RDW: 13.7 % (ref 11.5–15.5)
WBC Count: 2.2 10*3/uL — ABNORMAL LOW (ref 4.0–10.5)
nRBC: 0 % (ref 0.0–0.2)

## 2024-10-16 MED ORDER — DIPHENHYDRAMINE HCL 25 MG PO CAPS
25.0000 mg | ORAL_CAPSULE | Freq: Once | ORAL | Status: AC
  Administered 2024-10-16: 25 mg via ORAL
  Filled 2024-10-16: qty 1

## 2024-10-16 MED ORDER — DARATUMUMAB-HYALURONIDASE-FIHJ 1800-30000 MG-UT/15ML ~~LOC~~ SOLN
1800.0000 mg | Freq: Once | SUBCUTANEOUS | Status: AC
  Administered 2024-10-16: 1800 mg via SUBCUTANEOUS
  Filled 2024-10-16: qty 15

## 2024-10-16 MED ORDER — DIPHENHYDRAMINE HCL 25 MG PO CAPS: 50.0000 mg | ORAL_CAPSULE | Freq: Once | ORAL | Status: DC

## 2024-10-16 MED ORDER — DEXAMETHASONE 6 MG PO TABS
20.0000 mg | ORAL_TABLET | Freq: Once | ORAL | Status: AC
  Administered 2024-10-16: 20 mg via ORAL
  Filled 2024-10-16: qty 2

## 2024-10-16 MED ORDER — ACETAMINOPHEN 325 MG PO TABS
650.0000 mg | ORAL_TABLET | Freq: Once | ORAL | Status: AC
  Administered 2024-10-16: 650 mg via ORAL
  Filled 2024-10-16: qty 2

## 2024-10-16 NOTE — Patient Instructions (Signed)
 [facility] - A DEPT OF [facility]  Discharge Instructions: Thank you for choosing [facility] to provide your oncology and hematology care.   If you have a lab appointment with the Cancer Center, please go directly to the Cancer Center and check in at the registration area.   Wear comfortable clothing and clothing appropriate for easy access to any Portacath or PICC line.   We strive to give you quality time with your provider. You may need to reschedule your appointment if you arrive late (15 or more minutes).  Arriving late affects you and other patients whose appointments are after yours.  Also, if you miss three or more appointments without notifying the office, you may be dismissed from the clinic at the providers discretion.      For prescription refill requests, have your pharmacy contact our office and allow 72 hours for refills to be completed.    Today you received the following chemotherapy and/or immunotherapy agents darzalex  faspro   (Daratumumab -hyaluronidase )   To help prevent nausea and vomiting after your treatment, we encourage you to take your nausea medication as directed.  BELOW ARE SYMPTOMS THAT SHOULD BE REPORTED IMMEDIATELY: *FEVER GREATER THAN 100.4 F (38 C) OR HIGHER *CHILLS OR SWEATING *NAUSEA AND VOMITING THAT IS NOT CONTROLLED WITH YOUR NAUSEA MEDICATION *UNUSUAL SHORTNESS OF BREATH *UNUSUAL BRUISING OR BLEEDING *URINARY PROBLEMS (pain or burning when urinating, or frequent urination) *BOWEL PROBLEMS (unusual diarrhea, constipation, pain near the anus) TENDERNESS IN MOUTH AND THROAT WITH OR WITHOUT PRESENCE OF ULCERS (sore throat, sores in mouth, or a toothache) UNUSUAL RASH, SWELLING OR PAIN  UNUSUAL VAGINAL DISCHARGE OR ITCHING   Items with * indicate a potential emergency and should be followed up as soon as possible or go to the Emergency Department if any problems should occur.  Please show the  CHEMOTHERAPY ALERT CARD or IMMUNOTHERAPY ALERT CARD at check-in to the Emergency Department and triage nurse.  Should you have questions after your visit or need to cancel or reschedule your appointment, please contact [facility] - A DEPT OF [facility]  Dept: [phone]  and follow the prompts.  Office hours are 8:00 a.m. to 4:30 p.m. Monday - Friday. Please note that voicemails left after 4:00 p.m. may not be returned until the following business day.  We are closed weekends and major holidays. You have access to a nurse at all times for urgent questions. Please call the main number to the clinic Dept: [phone] and follow the prompts.   For any non-urgent questions, you may also contact your provider using MyChart. We now offer e-Visits for anyone 67 and older to request care online for non-urgent symptoms. For details visit [email or web address].   Also download the MyChart app! Go to the app store, search MyChart, open the app, select [place], and log in with your MyChart username and password.

## 2024-10-16 NOTE — Progress Notes (Incomplete)
 " HEMATOLOGY ONCOLOGY PROGRESS NOTE  Date of service: 10/16/2024  Patient Care Team: [name], MD as PCP - General (Hematology) Huey, [name], MD as Referring Physician (Neurology)  CHIEF COMPLAINT/PURPOSE OF CONSULTATION: Follow-up for continued evaluation and management of multiple myeloma  HISTORY OF PRESENTING ILLNESS: (07/25/2023) Colin Calderon is a wonderful 74 y.o. male who has been referred to us  by [name] Huey, MD for evaluation and management of abnormal protein.    He presented to the ED on 07/06/2023 for pneumonia of both lungs due to infectious organism.    Today, he is accompanied by his wife and friend. 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 His anemia is gradually worsening, previously hgb was11.2, 10 months ago and more recently 9.7 a week ago.    He reports that he was having some numbness in his hands and feet present for 2-4 weeks, which caused him to see a neurologist. As part of the neurologist's workup to see if his symptoms are related to paraproteinemia, there were findings of abnormal protein with M protein level of 0.2 g/dL. His wife reports that he has a nerve conduction study planned for December.    He denies any new fatigue, unexplained fever, chills, night sweats, or unexpected weight loss. He reports a 3-pound weight loss based on his scale at home.    He reports that he recently had pneumonia which was not unusual as he did have pneumonia previosuly. Patient notes that he did develop a fever causing him to present to the ED. Patient denies any SOB at this time, but does endorse sporadic coughing. He denies any issues with urinary infections or sinus infections.    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    He reports having a slightly enlarged prostate.    Patient reports having his thyroid  checked with [facility] recently, but can't find the results. He reports a fhx of thyroid  issues.    He denies  any new leg swelling, skin rashes, change in vision that is new or different, or headaches that are new or different. Patient denies any abdominal symptoms, such as abdominal pain or abdominal distention.     Patient denies having any chronic medical issues such as heart or lung issues.    He reports that he only takes B-12 replacement. Patient notes that he was previously B12 deficient in the past. He reports that the vitamin pills were not being absorbed.    Patient tends to be vegetarian for the most part, but does eats chicken occasionally.    He has no other medical issues.    He reports a surgical history of inguinal hernia surgery years ago most likely on the right side.    He reports an allergy to amoxicillin with rash reaction. He has not other medication allergies.    Patient does report a history of thyroid  disorders in the family.  He reports that his brother passed away from leukemia. Patient reports that his father did have coronary issues 50  Mother 73 years   He is a never smoker and denies any alcohol consumption or use of other recreational chemicals.    Previously, patient was generally very active through activities including hiking, walking, and swimming. However, over the last couple of months he has endorsed new fatigue alongside worsening anemia. His wife reports that he has sometimes felt too tired to drive, which is unusual. She also noticed that he lags behind when they  go for walks together over the last six months.    He complains of poor sleep mostly due to urinary frequency, which is not a new symptom. 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 Patient reports very sensitive arms and legs. Patient reports that his mother did have horrible neuropathy and is constantly in pain.    His wife reports that patient previously had a loud voice and has lost his voice recently, which patient attributes to pneumonia. He denies  any chest discomfort at this time.    He did have thyroid  function checked recently. Patient denies any obvious lumps/bumps.Patient denies any spine pain or abdominal pain.    SUMMARY OF ONCOLOGIC HISTORY: Oncology History  Multiple myeloma (HCC)  10/24/2023 Initial Diagnosis   Multiple myeloma (HCC)   11/07/2023 -  Chemotherapy   Patient is on Treatment Plan : MYELOMA NEWLY DIAGNOSED TRANSPLANT CANDIDATE DaraVRd (Daratumumab  SQ) q21d x 6 Cycles (Induction/Consolidation)       INTERVAL HISTORY: Colin Calderon is a 74 y.o. male who is here today for continued evaluation and management of multiple myeloma. He is accompanied by his wife today.   he was last seen by me on 08/21/2024; at the time he did not have any concerns and was doing well.   Today, he notices fatigue and heat flashes. He notes this is intermittent and unrelated to the timing of his treatments. He denies a history of personal thyroid  issues, although his father had a hx of thyroid  issues.   His wife endorses that he has frequent colds and inquires about IVIG due to immunosupression. He had a minor pneumonia infection in December and was on an antibiotic.    REVIEW OF SYSTEMS:   10 Point review of systems of done and is negative except as noted above.  MEDICAL HISTORY Past Medical History:  Diagnosis Date   Anemia    History of hiatal hernia    Multiple myeloma (HCC)    Pneumonia     SURGICAL HISTORY Past Surgical History:  Procedure Laterality Date   COLONOSCOPY  09/19/2021   HERNIA REPAIR     INGUINAL HERNIA REPAIR Right 07/28/2024   Procedure: REPAIR, HERNIA, INGUINAL, LAPAROSCOPIC;  Surgeon: Rubin Calamity, MD;  Location: [facility] OR;  Service: General;  Laterality: Right;   INSERTION OF MESH Right 07/28/2024   Procedure: INSERTION OF MESH;  Surgeon: Rubin Calamity, MD;  Location: [facility] OR;  Service: General;  Laterality: Right;    SOCIAL HISTORY Social History[1]  Social History   Social History  Narrative   Not on file    SOCIAL DRIVERS OF HEALTH SDOH Screenings   Food Insecurity: Low Risk (10/16/2023)   Received from [facility]  Housing: Unknown (02/11/2024)   Received from [facility]  Transportation Needs: No Transportation Needs (10/16/2023)   Received from [facility]  Utilities: Low Risk (10/16/2023)   Received from [facility]  Alcohol Screen: Low Risk (07/01/2023)  Depression (PHQ2-9): Low Risk (10/16/2024)  Financial Resource Strain: Low Risk (07/01/2023)  Physical Activity: Sufficiently Active (07/01/2023)  Social Connections: Socially Integrated (07/01/2023)  Stress: No Stress Concern Present (07/01/2023)  Tobacco Use: Low Risk (10/08/2024)   Received from [facility]  Health Literacy: Adequate Health Literacy (07/01/2023)     FAMILY HISTORY No family history on file.   ALLERGIES: is allergic to amoxicillin.  MEDICATIONS  Current Outpatient Medications  Medication Sig Dispense Refill   acyclovir  (ZOVIRAX ) 400 MG tablet  Take 1 tablet (400 mg total) by mouth 2 (two) times daily. 60 tablet 5   aspirin  EC 81 MG tablet Take 1 tablet (81 mg total) by mouth daily. 100 tablet 3   benzonatate  (TESSALON ) 100 MG capsule Take 1 capsule (100 mg total) by mouth 3 (three) times daily as needed for cough. 20 capsule 0   CYANOCOBALAMIN  SL Take 1 tablet by mouth once a week.     lenalidomide  (REVLIMID ) 15 MG capsule Take 1 capsule (15 mg total) by mouth daily. Take 1 capsule (15 mg total) by mouth daily for 21 days. Take 7 days off and repeat cycle. 21 capsule 0   ondansetron  (ZOFRAN ) 8 MG tablet Take 1 tablet (8 mg total) by mouth every 8 (eight) hours as needed for nausea or vomiting. (Patient not taking: Reported on 07/16/2024) 30 tablet 1   predniSONE  (STERAPRED UNI-PAK 21 TAB) 10 MG (21) TBPK tablet Per instructions (6,5,4,3,2,1) (Patient not taking: Reported on 07/16/2024) 21 tablet 0   prochlorperazine  (COMPAZINE ) 10 MG tablet Take 1 tablet (10 mg  total) by mouth every 6 (six) hours as needed for nausea or vomiting. (Patient not taking: Reported on 07/16/2024) 30 tablet 1   traMADol  (ULTRAM ) 50 MG tablet Take 1 tablet (50 mg total) by mouth every 6 (six) hours as needed. 20 tablet 0   No current facility-administered medications for this visit.    PHYSICAL EXAMINATION: ECOG PERFORMANCE STATUS: 1 - Symptomatic but completely ambulatory VITALS: Vitals:   10/16/24 1207 10/16/24 1212  BP: (!) 109/59 98/60  Pulse: (!) 59   Resp: 17   Temp: 97.8 F (36.6 C)   SpO2: 100%    Filed Weights   10/16/24 1207  Weight: 172 lb 8 oz (78.2 kg)   Body mass index is 24.06 kg/m.  GENERAL: alert, in no acute distress and comfortable SKIN: no acute rashes, no significant lesions EYES: conjunctiva are pink and non-injected, sclera anicteric OROPHARYNX: MMM, no exudates, no oropharyngeal erythema or ulceration NECK: supple, no JVD LYMPH:  no palpable lymphadenopathy in the cervical, axillary or inguinal regions LUNGS: clear to auscultation b/l with normal respiratory effort HEART: regular rate & rhythm ABDOMEN:  normoactive bowel sounds , non tender, not distended, no hepatosplenomegaly Extremity: no pedal edema PSYCH: alert & oriented x 3 with fluent speech NEURO: no focal motor/sensory deficits  LABORATORY DATA:   I have reviewed the data as listed     Latest Ref Rng & Units 10/16/2024   11:58 AM 10/02/2024    8:21 AM 09/11/2024    7:59 AM  CBC EXTENDED  WBC 4.0 - 10.5 K/uL 2.2  1.5  3.3   RBC 4.22 - 5.81 MIL/uL 3.86  4.02  3.76   Hemoglobin 13.0 - 17.0 g/dL 88.2  87.4  88.2   HCT 39.0 - 52.0 % 35.4  36.7  34.9   Platelets 150 - 400 K/uL 189  167  181   NEUT# 1.7 - 7.7 K/uL 1.3  1.0  2.3   Lymph# 0.7 - 4.0 K/uL 0.4  0.2  0.4        Latest Ref Rng & Units 10/16/2024   11:58 AM 10/02/2024    8:21 AM 09/11/2024    7:59 AM  CMP  Glucose 70 - 99 mg/dL 887  897  852   BUN 8 - 23 mg/dL 13  15  20    Creatinine 0.61 - 1.24 mg/dL 8.99   8.98  8.78   Sodium 135 - 145 mmol/L  144  141  141   Potassium 3.5 - 5.1 mmol/L 4.7  3.8  3.9   Chloride 98 - 111 mmol/L 108  107  105   CO2 22 - 32 mmol/L 28  24  27    Calcium 8.9 - 10.3 mg/dL 8.5  8.1  8.7   Total Protein 6.5 - 8.1 g/dL 5.6  5.8  6.1   Total Bilirubin 0.0 - 1.2 mg/dL 0.3  0.6  0.7   Alkaline Phos 38 - 126 U/L 49  46  49   AST 15 - 41 U/L 18  15  17    ALT 0 - 44 U/L 19  15  17     Multipe Myeloma 10/02/2024    PET/CT Whole Body 06/15/2024  Impression   CONCLUSION: Compared to FDG PET/CT 07/26/2023: 1.  Interval decrease in FDG avidity of previously seen hypermetabolic lymph nodes above and below the diaphragm. There is a new left periclavicular lymph node with mild FDG uptake. 2.  Resolution of the previously seen hypermetabolic osseous lesions and background bone marrow uptake. No new FDG avid or lytic osseous lesion. Narrative   PET/CT WHOLE BODY, 06/15/2024 4:01 PM  INDICATION: Pre ASCT eval;  Multiple Myeloma, Hematologic malignancy, assess treatment response, Pre stem cell transplant evaluation, Encounter for other preprocedural examination \ \ Z01.818 Encounter for other preprocedural examination \ C90.00 Multiple myeloma not having achieved remission    (CMD) \ Z52.011 Autologous donor, stem cells Pre ASCT eval;  Multiple Myeloma ADDITIONAL HISTORY: As above COMPARISON: FDG PET/CT 07/26/2023  TECHNIQUE: 57 minutes after the intravenous injection of 12.09 mCi F-18 FDG, PET imaging was obtained from the skull vertex through the feet. These images were then attenuation corrected using low-dose CT and co-registered for anatomic localization. Standardized uptake values (SUV) were calculated using a lean body mass algorithm. Blood glucose at the time of injection was 84 mg/dL.  All CT scans at [facility] and [facility] are performed using radiation dose optimization techniques as appropriate to a performed exam, including but  not limited to one or more of the following: automatic exposure control, adjustment of the mA and/or kV according to patient size, use of iterative reconstruction technique. In addition, our institution participates in a radiation dose monitoring program to optimize patient radiation exposure.  SUVmax of blood pool: 2 SUVmax of liver: 2.2  LIMITATIONS: None  FINDINGS:  DISEASE-RELATED FINDINGS: Less conspicuous appearance of the mediastinal and supraclavicular hypermetabolic lymphadenopathy, however there is a new left periclavicular lymph node seen on image 161 with SUV max 5. Right upper paratracheal lymph node on image 175 with SUV max 4.2, previously SUV max 7.6. Mildly FDG avid subcarinal lymph node on image 197 with SUV max 3.5, previously SUV max 13.  Minimal background FDG uptake with scattered soft tissue densities in bilateral iliac bones, which favors bone marrow hyperplasia. No focal FDG avid lesion is visualized. Interval resolution of previously seen focal FDG uptake in the right scapula, left eccentric manubrium and sternum. There is also resolution of previously seen prominent FDG uptake in the background bone marrow.  Interval decrease in conspicuity of previously seen peripancreatic lymphadenopathy. Normal size of the spleen without abnormal FDG uptake  OTHER TRACER-RELATED FINDINGS: Expected physiologic activity within the kidneys, ureter, bladder, oropharynx, salivary glands, bowel, and brain.   ANCILLARY CT FINDINGS: Evaluation of the lower thighs and legs reveals no additional contributory findings. Minimal LAD calcifications Right apical calcified pulmonary granuloma measuring 0.3 cm Again  seen prostatomegaly and bladder wall thickening.     RADIOGRAPHIC STUDIES: I have personally reviewed the radiological images as listed and agreed with the findings in the report. No results found.  ASSESSMENT & PLAN:  74 y.o. male with  #IgG Lambda  Multiple myeloma -Kappa  light chain producing. -BMBx from 07/17/2023 showed two plasmacytoid populations, one kappa and one lambda restricted. The predominant clone is CD20 positive and CD5 and CD10 negative.  - Patient started Dara/Rev/Dex on 11/07/2023.  - Revlimid  was dose reduced from 25 mg to 15 mg on 12/23/2023 due to cytopenias.   PLAN: - Discussed lab results on 10/16/2024 in detail with patient: -hemoglobin 11.7 -PLTs 189k -WBC are slightly lowered, but he is on the week off of Revlimid  -Kidney function and electrolytes are stable -Calcium levels are low, recommended he take Vitamin D 5000 units per day -Daratumumab  treatment every two weeks for the next few months to achieve the best response, and if markers are decreasing, we will go to once a month treatments for maintenance  -will lower benadryl  dose to prevent fatigue  -will keep an eye on thyroid  functions if insurance allows -suggested he take precaution when swimming on upcoming trip to [place]  -discussed if light chains are getting close to baseline, we will lower Revlimid  dose to 10mg  and reduce Daratumumab  dose as well  -we will review labs in a month  FOLLOW-UP   The total time spent in the appointment was *** minutes* .  All of the patient's questions were answered and the patient knows to call the clinic with any problems, questions, or concerns.  Emaline Saran MD MS AAHIVMS [facility] Hematology/Oncology Physician [facility]  *Total Encounter Time as defined by the Centers for Medicare and Medicaid Services includes, in addition to the face-to-face time of a patient visit (documented in the note above) non-face-to-face time: obtaining and reviewing outside history, ordering and reviewing medications, tests or procedures, care coordination (communications with other health care professionals or caregivers) and documentation in the medical record.  I, Alan Blowers, acting as a neurosurgeon for Emaline Saran, MD.,have documented all  relevant documentation on the behalf of Emaline Saran, MD,as directed by  Emaline Saran, MD while in the presence of Emaline Saran, MD.  I have reviewed the above documentation for accuracy and completeness, and I agree with the above.  Emaline Saran, MD    [1]  Social History Tobacco Use   Smoking status: Never   Smokeless tobacco: Never  Vaping Use   Vaping status: Never Used  Substance Use Topics   Alcohol use: Not Currently   Drug use: No   "

## 2024-10-23 ENCOUNTER — Inpatient Hospital Stay

## 2024-10-30 ENCOUNTER — Inpatient Hospital Stay

## 2024-11-06 ENCOUNTER — Inpatient Hospital Stay

## 2024-11-13 ENCOUNTER — Inpatient Hospital Stay

## 2024-11-13 ENCOUNTER — Inpatient Hospital Stay: Admitting: Hematology
